# Patient Record
Sex: Female | Born: 1950 | Race: White | Hispanic: No | Marital: Married | Smoking: Never smoker
Health system: Southern US, Community
[De-identification: ages and names within clinical notes are randomized; demographics above are authoritative.]

## PROBLEM LIST (undated history)

## (undated) DIAGNOSIS — D649 Anemia, unspecified: Secondary | ICD-10-CM

## (undated) DIAGNOSIS — I10 Essential (primary) hypertension: Secondary | ICD-10-CM

## (undated) DIAGNOSIS — M1990 Unspecified osteoarthritis, unspecified site: Secondary | ICD-10-CM

## (undated) DIAGNOSIS — E0590 Thyrotoxicosis, unspecified without thyrotoxic crisis or storm: Secondary | ICD-10-CM

## (undated) DIAGNOSIS — Z5189 Encounter for other specified aftercare: Secondary | ICD-10-CM

## (undated) DIAGNOSIS — K219 Gastro-esophageal reflux disease without esophagitis: Secondary | ICD-10-CM

## (undated) DIAGNOSIS — Z91018 Allergy to other foods: Secondary | ICD-10-CM

## (undated) DIAGNOSIS — L900 Lichen sclerosus et atrophicus: Secondary | ICD-10-CM

## (undated) DIAGNOSIS — H269 Unspecified cataract: Secondary | ICD-10-CM

## (undated) DIAGNOSIS — K5792 Diverticulitis of intestine, part unspecified, without perforation or abscess without bleeding: Secondary | ICD-10-CM

## (undated) HISTORY — PX: MENISCUS REPAIR: SHX5179

## (undated) HISTORY — DX: Encounter for other specified aftercare: Z51.89

## (undated) HISTORY — DX: Unspecified cataract: H26.9

## (undated) HISTORY — DX: Allergy to other foods: Z91.018

## (undated) HISTORY — PX: ABDOMINAL HYSTERECTOMY: SHX81

## (undated) HISTORY — DX: Thyrotoxicosis, unspecified without thyrotoxic crisis or storm: E05.90

## (undated) HISTORY — DX: Gastro-esophageal reflux disease without esophagitis: K21.9

## (undated) HISTORY — DX: Diverticulitis of intestine, part unspecified, without perforation or abscess without bleeding: K57.92

## (undated) HISTORY — DX: Lichen sclerosus et atrophicus: L90.0

## (undated) HISTORY — DX: Essential (primary) hypertension: I10

## (undated) HISTORY — DX: Unspecified osteoarthritis, unspecified site: M19.90

## (undated) HISTORY — DX: Anemia, unspecified: D64.9

## (undated) HISTORY — PX: BREAST EXCISIONAL BIOPSY: SUR124

## (undated) HISTORY — PX: BREAST LUMPECTOMY: SHX2

---

## 2000-05-25 ENCOUNTER — Encounter: Admission: RE | Admit: 2000-05-25 | Discharge: 2000-05-25 | Payer: Self-pay | Admitting: Family Medicine

## 2000-05-25 ENCOUNTER — Encounter: Payer: Self-pay | Admitting: Family Medicine

## 2004-05-03 ENCOUNTER — Ambulatory Visit: Payer: Self-pay | Admitting: Family Medicine

## 2004-12-31 ENCOUNTER — Ambulatory Visit: Payer: Self-pay | Admitting: Family Medicine

## 2005-01-10 ENCOUNTER — Ambulatory Visit: Payer: Self-pay | Admitting: Family Medicine

## 2005-02-16 ENCOUNTER — Ambulatory Visit: Payer: Self-pay | Admitting: Family Medicine

## 2006-01-03 ENCOUNTER — Ambulatory Visit: Payer: Self-pay | Admitting: Internal Medicine

## 2006-02-21 ENCOUNTER — Ambulatory Visit: Payer: Self-pay | Admitting: Family Medicine

## 2006-05-25 ENCOUNTER — Ambulatory Visit: Payer: Self-pay | Admitting: Family Medicine

## 2006-06-15 ENCOUNTER — Encounter: Payer: Self-pay | Admitting: Internal Medicine

## 2006-06-15 DIAGNOSIS — E0590 Thyrotoxicosis, unspecified without thyrotoxic crisis or storm: Secondary | ICD-10-CM | POA: Insufficient documentation

## 2006-10-30 ENCOUNTER — Encounter (INDEPENDENT_AMBULATORY_CARE_PROVIDER_SITE_OTHER): Payer: Self-pay | Admitting: Internal Medicine

## 2006-10-30 ENCOUNTER — Ambulatory Visit: Payer: Self-pay | Admitting: Family Medicine

## 2006-10-30 DIAGNOSIS — L03019 Cellulitis of unspecified finger: Secondary | ICD-10-CM

## 2006-10-30 DIAGNOSIS — M79609 Pain in unspecified limb: Secondary | ICD-10-CM

## 2006-11-16 ENCOUNTER — Ambulatory Visit: Payer: Self-pay | Admitting: Family Medicine

## 2006-11-16 DIAGNOSIS — L259 Unspecified contact dermatitis, unspecified cause: Secondary | ICD-10-CM

## 2006-11-23 ENCOUNTER — Ambulatory Visit: Payer: Self-pay | Admitting: Family Medicine

## 2006-12-25 ENCOUNTER — Ambulatory Visit: Payer: Self-pay | Admitting: Internal Medicine

## 2006-12-26 ENCOUNTER — Encounter (INDEPENDENT_AMBULATORY_CARE_PROVIDER_SITE_OTHER): Payer: Self-pay | Admitting: Internal Medicine

## 2006-12-27 ENCOUNTER — Telehealth (INDEPENDENT_AMBULATORY_CARE_PROVIDER_SITE_OTHER): Payer: Self-pay | Admitting: Internal Medicine

## 2006-12-28 ENCOUNTER — Telehealth (INDEPENDENT_AMBULATORY_CARE_PROVIDER_SITE_OTHER): Payer: Self-pay | Admitting: Internal Medicine

## 2006-12-29 ENCOUNTER — Telehealth (INDEPENDENT_AMBULATORY_CARE_PROVIDER_SITE_OTHER): Payer: Self-pay | Admitting: Internal Medicine

## 2007-01-02 ENCOUNTER — Ambulatory Visit: Payer: Self-pay | Admitting: Family Medicine

## 2007-01-05 ENCOUNTER — Telehealth (INDEPENDENT_AMBULATORY_CARE_PROVIDER_SITE_OTHER): Payer: Self-pay | Admitting: Internal Medicine

## 2007-01-09 ENCOUNTER — Telehealth (INDEPENDENT_AMBULATORY_CARE_PROVIDER_SITE_OTHER): Payer: Self-pay | Admitting: Internal Medicine

## 2007-01-26 ENCOUNTER — Telehealth (INDEPENDENT_AMBULATORY_CARE_PROVIDER_SITE_OTHER): Payer: Self-pay | Admitting: Internal Medicine

## 2007-05-25 ENCOUNTER — Ambulatory Visit: Payer: Self-pay | Admitting: Family Medicine

## 2007-10-19 ENCOUNTER — Ambulatory Visit: Payer: Self-pay | Admitting: Family Medicine

## 2008-01-28 ENCOUNTER — Ambulatory Visit: Payer: Self-pay | Admitting: Family Medicine

## 2008-01-31 ENCOUNTER — Ambulatory Visit: Payer: Self-pay | Admitting: Family Medicine

## 2008-10-21 ENCOUNTER — Ambulatory Visit: Payer: Self-pay | Admitting: Family Medicine

## 2008-10-21 DIAGNOSIS — R21 Rash and other nonspecific skin eruption: Secondary | ICD-10-CM

## 2008-10-21 DIAGNOSIS — I10 Essential (primary) hypertension: Secondary | ICD-10-CM

## 2008-10-21 HISTORY — DX: Essential (primary) hypertension: I10

## 2009-03-03 ENCOUNTER — Telehealth (INDEPENDENT_AMBULATORY_CARE_PROVIDER_SITE_OTHER): Payer: Self-pay | Admitting: Internal Medicine

## 2009-03-06 ENCOUNTER — Encounter: Admission: RE | Admit: 2009-03-06 | Discharge: 2009-03-06 | Payer: Self-pay | Admitting: Family Medicine

## 2009-03-11 ENCOUNTER — Encounter: Payer: Self-pay | Admitting: Family Medicine

## 2009-10-23 ENCOUNTER — Ambulatory Visit: Payer: Self-pay | Admitting: Family Medicine

## 2010-04-27 NOTE — Assessment & Plan Note (Signed)
Summary: 30 MIN ?THUMBNAIL INFECTION/CLE   Vital Signs:  Patient profile:   60 year old female Height:      65 inches Weight:      180.25 pounds BMI:     30.10 Temp:     98.5 degrees F oral Pulse rate:   80 / minute BP sitting:   110 / 70  (left arm) Cuff size:   regular  Vitals Entered By: Linde Gillis CMA Duncan Dull) (October 23, 2009 1:52 PM) CC: 30 minute exam, ? thumb infection   History of Present Illness:  Ihe denied overt little lady presents with right-sided thumb pain, she is having pain on the lateral aspect of her phone, and she does do a great deal scanning and working in the garden. She also cleans houses some for living.  His now very painful, and as low but is of some redness and some degree of redness it is fair, and also little bit of some blood has been expressed. There hasn't been any gross pus.  REVIEW OF SYSTEMS GEN: No acute illnesses, no fever, chills, sweats. CV: No chest pain or SOB GI: No noted N or V Otherwise, pertinent positives and negatives are noted in the HPI.   GEN: Well-developed,well-nourished,in no acute distress; alert,appropriate and cooperative throughout examination HEENT: Normocephalic and atraumatic without obvious abnormalities. No apparent alopecia or balding. Ears, externally no deformities PULM: Breathing comfortably in no respiratory distress EXT: No clubbing, cyanosis, or edema PSYCH: Normally interactive. Cooperative during the interview. Pleasant. Friendly and conversant. Not anxious or depressed appearing. Normal, full affect.   Full range of motion in all hands, patient has had diffuse Heberden's and Bouchard's nodes in evidence of osteoarthritic changes throughout. On the medial aspect of the right side fingernail on the first digit, there is some redness, and some induration. There is no gross pus available.  Allergies: 1)  ! Demerol 2)  ! Compazine 3)  ! Benadryl  Past History:  Past Medical History: HYPERTHYROIDISM  (ICD-242.90)     Impression & Recommendations:  Problem # 1:  PARONYCHIA, FINGER (ICD-681.02) Assessment New finger prepped with Betadine x2 and alcohol x2, and the edge of the nail was touched with a #11 scalpel. No pus was expressed.  , and I placed the patient on antibiotics, have her do warm soaks many times during a day, about 10, and if she is not better, recommend she follow, and she's been seen my partner Dr. Para March for a toenail removal or rather a fingernail removal on Monday or Tuesday. 30 minute slot.  Her updated medication list for this problem includes:    Cephalexin 500 Mg Tabs (Cephalexin) .Marland Kitchen... Take one by mouth 4 times daily  Complete Medication List: 1)  Synthroid 137 Mcg Tabs (Levothyroxine sodium) .... Take 1 tablet by mouth once a day 2)  Aspirin 325 Mg Tabs (Aspirin) .... Takes one daily as needed 3)  Cephalexin 500 Mg Tabs (Cephalexin) .... Take one by mouth 4 times daily Prescriptions: CEPHALEXIN 500 MG  TABS (CEPHALEXIN) take one by mouth 4 times daily  #40 x 0   Entered and Authorized by:   Hannah Beat MD   Signed by:   Hannah Beat MD on 10/23/2009   Method used:   Electronically to        [facility] (retail)       [address]       [place], [place]  [address]       Ph: [phone]  or [phone]       Fax: [phone]   RxID:   [id number]   Current Allergies (reviewed today): ! DEMEROL ! COMPAZINE ! BENADRYL

## 2010-10-08 ENCOUNTER — Ambulatory Visit (HOSPITAL_BASED_OUTPATIENT_CLINIC_OR_DEPARTMENT_OTHER)
Admission: RE | Admit: 2010-10-08 | Discharge: 2010-10-08 | Disposition: A | Discharge status: Home / Self Care | Payer: 59 | Source: Ambulatory Visit | Attending: Specialist | Admitting: Specialist

## 2010-10-08 DIAGNOSIS — IMO0002 Reserved for concepts with insufficient information to code with codable children: Secondary | ICD-10-CM | POA: Insufficient documentation

## 2010-10-08 DIAGNOSIS — Z01812 Encounter for preprocedural laboratory examination: Secondary | ICD-10-CM | POA: Insufficient documentation

## 2010-10-08 DIAGNOSIS — M2240 Chondromalacia patellae, unspecified knee: Secondary | ICD-10-CM | POA: Insufficient documentation

## 2010-10-08 DIAGNOSIS — Z01810 Encounter for preprocedural cardiovascular examination: Secondary | ICD-10-CM | POA: Insufficient documentation

## 2010-10-08 DIAGNOSIS — X58XXXA Exposure to other specified factors, initial encounter: Secondary | ICD-10-CM | POA: Insufficient documentation

## 2010-10-08 LAB — POCT HEMOGLOBIN-HEMACUE: Hemoglobin: 13.8 g/dL (ref 12.0–15.0)

## 2010-10-15 NOTE — Op Note (Signed)
  Melissa Cox, Melissa Cox              ACCOUNT NO.:  [id number]  MEDICAL RECORD NO.:  [id number]  LOCATION:                                 FACILITY:  PHYSICIAN:  Jene Every, M.D.    DATE OF BIRTH:  07/17/1950  DATE OF PROCEDURE:  10/08/2010 DATE OF DISCHARGE:                              OPERATIVE REPORT   PREOPERATIVE DIAGNOSIS:  Medial meniscus tear, left knee.  POSTOPERATIVE DIAGNOSES: 1. Medial meniscus tear, left knee. 2. 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 3. Grade III chondromalacia of the patella. 4. 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  PROCEDURES PERFORMED: 1. Left knee arthroscopy. 2. Partial medial meniscectomy. 3. Chondroplasties of the femoral condyle, tibial plateau and patella.  HISTORY:  A 60 year old with locking, popping, giving way.  MRI indicating posterior horn medial meniscus tear, chondromalacias, is indicated for arthroscopic debridement and partial meniscectomy.  Risks and benefits were discussed including bleeding, infection, no change in symptoms, worsening symptoms, need for repeat debridement, DVT, PE, anesthetic complications, etc.  TECHNIQUE:  Patient in supine position.  After adequate general anesthesia and 1 g Kefzol, left lower extremity was prepped and draped in the usual sterile fashion.  A lateral parapatellar portal and superomedial parapatellar portal was fashioned with a #11 blade. Ingress cannula atraumatically placed.  Irrigant was utilized to insufflate the joint.  Under direct visualization, medial parapatellar portal was fashioned with a #11 blade after localization with an 18 gauge needle sparing the medial meniscus noted.  There was mild grade III chondromalacia of the femoral condyle with posterior root meniscus tear with hemorrhagic zone.  Introduced the shaver and debrided it to a stable base, very small tear was noted.  ACL was unremarkable.  Lateral  compartment revealed extensive grade III changes of tibial plateau and chondral flap tear with cartilaginous debris.  Light chondroplasty was performed here.  Meniscus was stable to probe palpation without evidence of tearing.  Suprapatellar pouch revealed minor grade III changes of the patella, normal patellofemoral tracking.  Gutters unremarkable.  Light chondroplasty was performed here.  Copiously lavaged the knee.  All compartments were reexamined.  No further pathology amenable arthroscopic intervention, therefore, removed all instrumentation and portals were closed with 4-0 nylon simple sutures, 0.25% Marcaine with epinephrine was infiltrated in the joint and wound was dressed sterilely. Awoken without difficulty and transported to the recovery room in satisfactory condition.  The patient tolerated the procedure.  There were no complications.  ASSISTANT:  None.     Jene Every, M.D.     Cordelia Pen  D:  10/08/2010  T:  10/08/2010  Job:  [id number]  Electronically Signed by Jene Every M.D. on 10/15/2010 03:14:20 PM

## 2011-08-24 ENCOUNTER — Ambulatory Visit (INDEPENDENT_AMBULATORY_CARE_PROVIDER_SITE_OTHER): Payer: 59 | Admitting: Family Medicine

## 2011-08-24 ENCOUNTER — Encounter: Payer: Self-pay | Admitting: Family Medicine

## 2011-08-24 VITALS — BP 160/82 | HR 84 | Temp 98.7°F | Wt 182.0 lb

## 2011-08-24 DIAGNOSIS — L03114 Cellulitis of left upper limb: Secondary | ICD-10-CM

## 2011-08-24 DIAGNOSIS — I10 Essential (primary) hypertension: Secondary | ICD-10-CM

## 2011-08-24 DIAGNOSIS — IMO0002 Reserved for concepts with insufficient information to code with codable children: Secondary | ICD-10-CM

## 2011-08-24 MED ORDER — AMOXICILLIN-POT CLAVULANATE 875-125 MG PO TABS: 1.0000 | ORAL_TABLET | Freq: Two times a day (BID) | ORAL | Status: AC

## 2011-08-24 MED ORDER — HYDROCHLOROTHIAZIDE 12.5 MG PO TABS: 12.5000 mg | ORAL_TABLET | Freq: Every day | ORAL | Status: DC

## 2011-08-24 NOTE — Patient Instructions (Signed)
F/u tomorrow afternoon to recheck with Dr. Patsy Lager

## 2011-08-24 NOTE — Progress Notes (Signed)
  Patient Name: Melissa Cox Date of Birth: Oct 29, 1950 Age: 61 y.o. Medical Record Number: [id number] Gender: female Date of Encounter: 08/24/2011  History of Present Illness:  [name] is a 61 y.o. very pleasant female patient who presents with the following:  Last PM: rooster puncture  The patient was attack yesterday afternoon by a large blister. The spur punctured the volar aspect of her forearm. Today it has become red and warm. There is a puncture mark. She has not been able to soak it much. There is no fluctuance or pus. She is currently afebrile.  Hypertension: Blood pressure elevated today, was also recently elevated in another physician's office. She has had some elevated blood pressures in the past.  Past Medical History, Surgical History, Social History, Family History, Problem List, Medications, and Allergies have been reviewed and updated if relevant.  Review of Systems: ROS: GEN: Acute illness details above GI: Tolerating PO intake GU: maintaining adequate hydration and urination Pulm: No SOB Interactive and getting along well at home.  Otherwise, ROS is as per the HPI.   Physical Examination: Filed Vitals:   08/24/11 1514  BP: 160/82  Pulse: 84  Temp: 98.7 F (37.1 C)  TempSrc: Oral  Weight: 182 lb (82.555 kg)    There is no height on file to calculate BMI.   GEN: WDWN, NAD, Non-toxic, A & O x 3 HEENT: Atraumatic, Normocephalic. Neck supple. No masses, No LAD. Ears and Nose: No external deformity. CV: RRR, No M/G/R. No JVD. No thrill. No extra heart sounds. PULM: CTA B, no wheezes, crackles, rhonchi. No retractions. No resp. distress. No accessory muscle use. EXTR: No c/c/e NEURO Normal gait.  PSYCH: Normally interactive. Conversant. Not depressed or anxious appearing.  Calm demeanor.   SKIN: Flat red well demarcated redness with some mild warmth. Small area of puncture wound centrally. No fluctuance.  Assessment and Plan:  1. Cellulitis  of arm, left  amoxicillin-clavulanate (AUGMENTIN) 875-125 MG per tablet  2. Unspecified essential hypertension  hydrochlorothiazide (HYDRODIURIL) 12.5 MG tablet   Borders marked with pen. Recheck tomorrow.  Orders Today: No orders of the defined types were placed in this encounter.    Medications Today: Meds ordered this encounter  Medications  . levothyroxine (SYNTHROID, LEVOTHROID) 125 MCG tablet    Sig: Take 125 mcg by mouth daily.  Marland Kitchen amoxicillin-clavulanate (AUGMENTIN) 875-125 MG per tablet    Sig: Take 1 tablet by mouth 2 (two) times daily.    Dispense:  20 tablet    Refill:  0  . hydrochlorothiazide (HYDRODIURIL) 12.5 MG tablet    Sig: Take 1 tablet (12.5 mg total) by mouth daily.    Dispense:  30 tablet    Refill:  3

## 2011-08-25 ENCOUNTER — Encounter: Payer: Self-pay | Admitting: Family Medicine

## 2011-08-25 ENCOUNTER — Ambulatory Visit (INDEPENDENT_AMBULATORY_CARE_PROVIDER_SITE_OTHER): Payer: 59 | Admitting: Family Medicine

## 2011-08-25 VITALS — BP 130/82 | HR 87 | Temp 98.7°F | Wt 182.8 lb

## 2011-08-25 DIAGNOSIS — IMO0002 Reserved for concepts with insufficient information to code with codable children: Secondary | ICD-10-CM

## 2011-08-25 DIAGNOSIS — L03119 Cellulitis of unspecified part of limb: Secondary | ICD-10-CM

## 2011-08-25 DIAGNOSIS — I10 Essential (primary) hypertension: Secondary | ICD-10-CM

## 2011-08-25 MED ORDER — DOXYCYCLINE HYCLATE 100 MG PO TABS: 100.0000 mg | ORAL_TABLET | Freq: Two times a day (BID) | ORAL | Status: DC

## 2011-08-25 MED ORDER — CEFTRIAXONE SODIUM 1 G IJ SOLR
1.0000 g | Freq: Once | INTRAMUSCULAR | Status: AC
  Administered 2011-08-24: 1 g via INTRAMUSCULAR

## 2011-08-25 NOTE — Progress Notes (Signed)
Addended by: Eliezer Bottom on: 08/25/2011 04:49 PM   Modules accepted: Orders

## 2011-08-25 NOTE — Patient Instructions (Signed)
F/u tomorrow morning with Dr. Sharen Hones

## 2011-08-26 ENCOUNTER — Ambulatory Visit (INDEPENDENT_AMBULATORY_CARE_PROVIDER_SITE_OTHER): Payer: 59 | Admitting: Family Medicine

## 2011-08-26 ENCOUNTER — Encounter: Payer: Self-pay | Admitting: Family Medicine

## 2011-08-26 VITALS — BP 130/80 | HR 88 | Temp 98.3°F | Wt 182.0 lb

## 2011-08-26 DIAGNOSIS — L03113 Cellulitis of right upper limb: Secondary | ICD-10-CM | POA: Insufficient documentation

## 2011-08-26 DIAGNOSIS — IMO0002 Reserved for concepts with insufficient information to code with codable children: Secondary | ICD-10-CM

## 2011-08-26 NOTE — Assessment & Plan Note (Signed)
Overall improved compared to yesterday. Only took 1 pill of doxycycline so far (last night). I doubt doxy is contributing to improvement but rather the fact that she rested arm overnight and possibly augmentin effect setting in. Given diarrhea, concern for abx associated effects to GI tract, recommended she hold doxycycline for now, and monitor over weekend, if any worsening to restart doxy. rec take yogurt with probiotic. F/u with PCP on Monday for hopefully final recheck.

## 2011-08-26 NOTE — Progress Notes (Signed)
  Subjective:    Patient ID: Melissa Cox, female    DOB: 1950-10-06, 61 y.o.   MRN: [id number]  HPI CC: recheck cellulitis  See prior notes for details.  I did see pt's cellulitis yesterday and she is here today to recheck this.  DOI: 5/28 pm Briefly, rooster spur stuck skin right dorsal forearm.  Seen here next day with spreading erythema and pain, treated with 1gm IM Rocephin as well as started on augmentin bid.  Next day erythema had spread even further so added on doxycycline.    Today redness is much better.  Has been keeping arm elevated at night.   No fevers/chills.   + diarrhea started today.  Also mild nausea with this.  Stomach upset 30 min after taking doxy (even though taken with meal)  Review of Systems Per HPI    Objective:   Physical Exam NAD, WDWN CF Right forearm with puncture mark with overlying small scab.  surrounding erythema and mild induration however erythema seems to be fading overall, has receded compared to yesterday's marks.  Delineated erythema border again today.  No streaking redness, no fluctuance.    Assessment & Plan:

## 2011-08-26 NOTE — Patient Instructions (Signed)
Cellulitis is looking much better! Stop doxycycline for now.  If worsening over weekend, you may restart this.  Finish 10 day course of augmentin. Take yogurt. Return on Monday for recheck with Dr. Patsy Lager

## 2011-08-26 NOTE — Progress Notes (Signed)
[facility] at [facility]  Patient Name: Melissa Cox Date of Birth: June 03, 1950 Medical Record Number: [id number] Gender: female Date of Encounter: 08/25/2011  History of Present Illness:  Melissa Cox is a 61 y.o. very pleasant female patient who presents with the following:  2 days prior the patient had a rooster spur that struck her forearm. Subsequently, she was evaluated the next day and had some redness pain and swelling around this area. She was given 1 g of Rocephin in the office and started on Augmentin 875. Today she is here today and her pain is diminished. There is some increased and spreading of the redness. She did work yesterday and used her hands and arms a great deal. She is currently afebrile. She is tolerating p.o. Intake. There is not been any pus or drainage.  Patient Active Problem List  Diagnoses  . HYPERTHYROIDISM  . Hypertension   Past Medical History  Diagnosis Date  . Unspecified essential hypertension 10/21/2008    Qualifier: Diagnosis of  By: Jillyn Hidden FNP, Mcarthur Rossetti   . 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 Abdominal hysterectomy 4098,1191    partial, fibroids  . Breast lumpectomy     right,benign   History  Substance Use Topics  . Smoking status: Never Smoker   . Smokeless tobacco: Not on file  . Alcohol Use: Not on file   No family history on file. Allergies  Allergen Reactions  . Diphenhydramine Hcl     REACTION: abd cramps and diarrhea  . Meperidine Hcl     REACTION: rash  . Prochlorperazine Edisylate     REACTION: increased heart rate    Medication list has been reviewed and updated.  Prior to Admission medications   Medication Sig Start Date End Date Taking? Authorizing Provider  amoxicillin-clavulanate (AUGMENTIN) 875-125 MG per tablet Take 1 tablet by mouth 2 (two) times daily. 08/24/11 09/03/11 Yes  Braedin Millhouse, MD  hydrochlorothiazide (HYDRODIURIL) 12.5 MG tablet Take 1 tablet (12.5 mg total) by mouth daily. 08/24/11 08/23/12 Yes Claris Guymon, MD  levothyroxine (SYNTHROID, LEVOTHROID) 125 MCG tablet Take 125 mcg by mouth daily.   Yes Historical Provider, MD  doxycycline (VIBRA-TABS) 100 MG tablet Take 1 tablet (100 mg total) by mouth 2 (two) times daily. 08/25/11 09/04/11  Hannah Beat, MD    Review of Systems:  ROS: GEN: Acute illness details above GI: Tolerating PO intake GU: maintaining adequate hydration and urination Pulm: No SOB Interactive and getting along well at home.  Otherwise, ROS is as per the HPI.   Physical Examination: Filed Vitals:   08/25/11 1544  BP: 130/82  Pulse: 87  Temp: 98.7 F (37.1 C)   Filed Vitals:   08/25/11 1544  Weight: 182 lb 12 oz (82.895 kg)   There is no height on file to calculate BMI.   GEN: WDWN, NAD, Non-toxic, Alert & Oriented x 3 HEENT: Atraumatic, Normocephalic.  Ears and Nose: No external deformity. EXTR: No clubbing/cyanosis/edema NEURO: Normal gait.  PSYCH: Normally interactive. Conversant. Not depressed or anxious appearing.  Calm demeanor.  SKIN: area of redness noted with a centralized puncture wound that has been closed. Area of redness is marked with a marker. This does appear to be slightly less red compared to the prior day.  Assessment and Plan:  1. Cellulitis of arm   2. Hypertension    Broaden the patient's coverage  with adding doxycycline. Area marked with magic marker again. I did have her follow up again tomorrow for a recheck with my partner Dr. Reece Agar - who I also had examine the area again today.  If she worsens overnight, then at that point she may ultimately need IV ABX or potentially a hand surgical consultation.  Orders Today: Orders Placed This Encounter  Procedures  . HM MAMMOGRAPHY    This external order was created through the Results Console.    Medications Today: Meds ordered this  encounter  Medications  . doxycycline (VIBRA-TABS) 100 MG tablet    Sig: Take 1 tablet (100 mg total) by mouth 2 (two) times daily.    Dispense:  20 tablet    Refill:  0     Hannah Beat, MD 08/26/2011 9:08 AM

## 2011-08-29 ENCOUNTER — Encounter: Payer: Self-pay | Admitting: Family Medicine

## 2011-08-29 ENCOUNTER — Ambulatory Visit (INDEPENDENT_AMBULATORY_CARE_PROVIDER_SITE_OTHER): Payer: 59 | Admitting: Family Medicine

## 2011-08-29 VITALS — BP 130/82 | HR 75 | Temp 98.4°F | Ht 65.0 in | Wt 182.5 lb

## 2011-08-29 DIAGNOSIS — L03113 Cellulitis of right upper limb: Secondary | ICD-10-CM

## 2011-08-29 DIAGNOSIS — IMO0002 Reserved for concepts with insufficient information to code with codable children: Secondary | ICD-10-CM

## 2011-08-29 NOTE — Progress Notes (Signed)
[facility] at [facility]  Patient Name: Melissa Cox Date of Birth: [birth year] Medical Record Number: [id number] Gender: female Date of Encounter: 08/29/2011  History of Present Illness:  Melissa Cox is a 61 y.o. very pleasant female patient who presents with the following:  DOI 08/23/2011  Here for recheck of cellulitis, s/p rooster spur wound. S/p Rocephin 1 g, Augmentin 875 mg bid. Had a few doses of doxy, stopped due to nausea  Prior OV 2 days prior the patient had a rooster spur that struck her forearm. Subsequently, she was evaluated the next day and had some redness pain and swelling around this area. She was given 1 g of Rocephin in the office and started on Augmentin 875. Today she is here today and her pain is diminished. There is some increased and spreading of the redness. She did work yesterday and used her hands and arms a great deal. She is currently afebrile. She is tolerating p.o. Intake. There is not been any pus or drainage.   Patient Active Problem List  Diagnoses  . HYPERTHYROIDISM  . Hypertension  . Right arm cellulitis   Past Medical History  Diagnosis Date  . Unspecified essential hypertension 10/21/2008  . 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 Abdominal hysterectomy 4098,1191    partial, fibroids  . Breast lumpectomy     right,benign   History  Substance Use Topics  . Smoking status: Never Smoker   . Smokeless tobacco: Not on file  . Alcohol Use: Not on file   No family history on file. Allergies  Allergen Reactions  . Diphenhydramine Hcl     REACTION: abd cramps and diarrhea  . Meperidine Hcl     REACTION: rash  . Prochlorperazine Edisylate     REACTION: increased heart rate    Medication list has been reviewed and updated.  Prior to Admission medications   Medication Sig Start Date End Date Taking? Authorizing  Provider  amoxicillin-clavulanate (AUGMENTIN) 875-125 MG per tablet Take 1 tablet by mouth 2 (two) times daily. 08/24/11 09/03/11 Yes Shankar Silber, MD  hydrochlorothiazide (HYDRODIURIL) 12.5 MG tablet Take 1 tablet (12.5 mg total) by mouth daily. 08/24/11 08/23/12 Yes Tyrell Brereton, MD  levothyroxine (SYNTHROID, LEVOTHROID) 125 MCG tablet Take 125 mcg by mouth daily.   Yes Historical Provider, MD    Review of Systems:  ROS: GEN: Acute illness details above GI: Tolerating PO intake GU: maintaining adequate hydration and urination Pulm: No SOB Interactive and getting along well at home.  Otherwise, ROS is as per the HPI.   Physical Examination: Filed Vitals:   08/29/11 0940  BP: 130/82  Pulse: 75  Temp: 98.4 F (36.9 C)   Filed Vitals:   08/29/11 0940  Height: 5\' 5"  (1.651 m)  Weight: 182 lb 8 oz (82.781 kg)   Body mass index is 30.37 kg/(m^2).   GEN: WDWN, NAD, Non-toxic, Alert & Oriented x 3 HEENT: Atraumatic, Normocephalic.  Ears and Nose: No external deformity. EXTR: No clubbing/cyanosis/edema NEURO: Normal gait.  PSYCH: Normally interactive. Conversant. Not depressed or anxious appearing.  Calm demeanor.  SKIN: dramatically improved  Assessment and Plan: F/u cellulitis, R forearm, much improved. Finish abx  Hannah Beat, MD 08/29/2011 9:54 AM

## 2012-11-07 ENCOUNTER — Ambulatory Visit (INDEPENDENT_AMBULATORY_CARE_PROVIDER_SITE_OTHER): Payer: 59 | Admitting: Family Medicine

## 2012-11-07 ENCOUNTER — Encounter: Payer: Self-pay | Admitting: Family Medicine

## 2012-11-07 VITALS — BP 150/88 | HR 68 | Temp 98.1°F | Wt 182.0 lb

## 2012-11-07 DIAGNOSIS — L03019 Cellulitis of unspecified finger: Secondary | ICD-10-CM | POA: Insufficient documentation

## 2012-11-07 DIAGNOSIS — L03012 Cellulitis of left finger: Secondary | ICD-10-CM

## 2012-11-07 MED ORDER — DOXYCYCLINE HYCLATE 100 MG PO TABS: 100.0000 mg | ORAL_TABLET | Freq: Two times a day (BID) | ORAL | Status: DC

## 2012-11-07 NOTE — Patient Instructions (Signed)
Complete antibiotics. Soak in warm water 3-4 times a day. Keep clean and covered, apply antibiotic ointment. Make appt to treat if redness spreading, increasing pain or swelling.

## 2012-11-07 NOTE — Progress Notes (Signed)
  Subjective:    Patient ID: Melissa Cox, female    DOB: 06/22/1950, 62 y.o.   MRN: [id number]  HPI  62 year old female presents with  right redness, pain, pressure and swelling in left thumb nail medially.  She does a lot of gardening, she washes and a lot but does not wear gloves a lot. No discharge. Has soaking it in epsom salt water and al=pplying neosporin.  No fever.     Review of Systems  Constitutional: Negative for fever and fatigue.  HENT: Negative for ear pain.   Eyes: Negative for pain.  Respiratory: Negative for chest tightness and shortness of breath.   Cardiovascular: Negative for chest pain, palpitations and leg swelling.  Gastrointestinal: Negative for abdominal pain.  Genitourinary: Negative for dysuria.       Objective:   Physical Exam  Constitutional: She appears well-developed and well-nourished.  Cardiovascular: Normal rate, regular rhythm and normal heart sounds.  Exam reveals no friction rub.   No murmur heard. Pulmonary/Chest: Effort normal and breath sounds normal. She has no wheezes. She has no rales.  Abdominal: Soft. Bowel sounds are normal.  Skin:   Red ness and swelling at medial left humb nail edge.          Assessment & Plan:

## 2012-11-07 NOTE — Assessment & Plan Note (Signed)
Will treat with warm soaks, antibiotics... Pt wants to wait on I and D if able. She is to call if not resolving towards end of antibotics or sooner if worsening.

## 2013-02-01 ENCOUNTER — Other Ambulatory Visit: Payer: Self-pay | Admitting: *Deleted

## 2013-02-01 MED ORDER — LEVOTHYROXINE SODIUM 112 MCG PO TABS: ORAL_TABLET | ORAL | Status: DC

## 2013-03-25 ENCOUNTER — Ambulatory Visit (INDEPENDENT_AMBULATORY_CARE_PROVIDER_SITE_OTHER): Payer: 59 | Admitting: Family Medicine

## 2013-03-25 ENCOUNTER — Encounter: Payer: Self-pay | Admitting: Family Medicine

## 2013-03-25 VITALS — BP 132/80 | HR 82 | Temp 98.7°F | Ht 65.0 in | Wt 186.2 lb

## 2013-03-25 DIAGNOSIS — Z13220 Encounter for screening for lipoid disorders: Secondary | ICD-10-CM

## 2013-03-25 DIAGNOSIS — H6980 Other specified disorders of Eustachian tube, unspecified ear: Secondary | ICD-10-CM

## 2013-03-25 DIAGNOSIS — H6981 Other specified disorders of Eustachian tube, right ear: Secondary | ICD-10-CM

## 2013-03-25 DIAGNOSIS — L299 Pruritus, unspecified: Secondary | ICD-10-CM | POA: Insufficient documentation

## 2013-03-25 DIAGNOSIS — J069 Acute upper respiratory infection, unspecified: Secondary | ICD-10-CM

## 2013-03-25 MED ORDER — FLUTICASONE PROPIONATE 50 MCG/ACT NA SUSP: 2.0000 | Freq: Every day | NASAL | Status: DC

## 2013-03-25 NOTE — Assessment & Plan Note (Signed)
symptom treatment.

## 2013-03-25 NOTE — Progress Notes (Signed)
Pre-visit discussion using our clinic review tool. No additional management support is needed unless otherwise documented below in the visit note.  

## 2013-03-25 NOTE — Assessment & Plan Note (Signed)
No clear sing of autoimmune disease.  More liekly contact derm/ allergic rxn.  Keep diary. Use antihistamin.  Will cahnk TSh and cbc... PCP can consider autoimmune disease testing if indicated.

## 2013-03-25 NOTE — Patient Instructions (Signed)
Return for CPX  With PCP with fasting labs prior. If hand itching.. Try zyrtec/claritin for antihistamine and keep diary of possible triggers. Start nasal saline spray 2-3 times daily. Start nasal steroid daily. Call if not improving as expected in next 7 days or call earlier if fever, or unilateral face pain.

## 2013-03-25 NOTE — Assessment & Plan Note (Signed)
Nasla saline, nasal steroid.

## 2013-03-25 NOTE — Progress Notes (Signed)
Subjective:    Patient ID: Melissa Cox, female    DOB: 1950-05-03, 62 y.o.   MRN: [id number]  Sore Throat  This is a new problem. The current episode started in the past 7 days (3 days). The problem has been gradually worsening. Neither side of throat is experiencing more pain than the other. There has been no fever. The pain is at a severity of 7/10. The pain is moderate. Associated symptoms include congestion, coughing, ear pain, a plugged ear sensation, swollen glands and trouble swallowing. Pertinent negatives include no drooling, ear discharge, headaches, shortness of breath or stridor. Associated symptoms comments: occ chills  mucus and cough in AMs, better later in the day  mild chest pain with breathing on right upper chest . She has had no exposure to strep or mono. Treatments tried: vit C, aspirin. The treatment provided mild relief.  Otalgia  There is pain in the right ear. This is a new problem. The current episode started today. The problem has been gradually worsening. Associated symptoms include coughing. Pertinent negatives include no ear discharge or headaches. There is no history of a chronic ear infection, hearing loss or a tympanostomy tube.   Nonsmoker.  Also 2 times in the last 3 weeks she has sudden onset of itching hands and feet. Hands appera red, no other rash. No tounge swelling, no lip swelling, no SOB. 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 No known exposure as the  Itching in hands is not related to using any cleaners. No known food changes. Anti itch cream.   She is concerned about auto immune disease. Requests testing.  No joint swelling or pain.  Some increase in fatigue. Neice with RA.   Review of Systems  HENT: Positive for congestion, ear pain and trouble swallowing. Negative for drooling and ear discharge.   Respiratory: Positive for cough. Negative for shortness of breath and stridor.     Neurological: Negative for headaches.       Objective:   Physical Exam  Constitutional: Vital signs are normal. She appears well-developed and well-nourished. She is cooperative.  Non-toxic appearance. She does not appear ill. No distress.  HENT:  Head: Normocephalic.  Right Ear: Hearing, tympanic membrane, external ear and ear canal normal. Tympanic membrane is not erythematous, not retracted and not bulging.  Left Ear: Hearing, tympanic membrane, external ear and ear canal normal. Tympanic membrane is not erythematous, not retracted and not bulging.  Nose: Mucosal edema and rhinorrhea present. Right sinus exhibits no maxillary sinus tenderness and no frontal sinus tenderness. Left sinus exhibits no maxillary sinus tenderness and no frontal sinus tenderness.  Mouth/Throat: Uvula is midline, oropharynx is clear and moist and mucous membranes are normal.  Eyes: Conjunctivae, EOM and lids are normal. Pupils are equal, round, and reactive to light. Lids are everted and swept, no foreign bodies found.  Neck: Trachea normal and normal range of motion. Neck supple. Carotid bruit is not present. No mass and no thyromegaly present.  Cardiovascular: Normal rate, regular rhythm, S1 normal, S2 normal, normal heart sounds, intact distal pulses and normal pulses.  Exam reveals no gallop and no friction rub.   No murmur heard. Pulmonary/Chest: Effort normal and breath sounds normal. Not tachypneic. No respiratory distress. She has no decreased breath sounds. She has no wheezes. She has no rhonchi. She has no rales.  Musculoskeletal:  No join swelling... 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  Neurological: She is alert.  Skin: Skin is warm, dry and intact. No rash noted.  Psychiatric: Her speech is normal and behavior is normal. Judgment normal. Her mood appears not anxious. Cognition and memory are normal. She does not exhibit a depressed mood.          Assessment & Plan:

## 2013-05-15 ENCOUNTER — Other Ambulatory Visit: Payer: Self-pay | Admitting: *Deleted

## 2013-05-15 MED ORDER — LEVOTHYROXINE SODIUM 112 MCG PO TABS: ORAL_TABLET | ORAL | Status: DC

## 2013-05-17 ENCOUNTER — Ambulatory Visit (INDEPENDENT_AMBULATORY_CARE_PROVIDER_SITE_OTHER): Payer: 59 | Admitting: Endocrinology

## 2013-05-17 ENCOUNTER — Encounter: Payer: Self-pay | Admitting: Endocrinology

## 2013-05-17 VITALS — BP 118/76 | HR 77 | Temp 98.3°F | Resp 14 | Ht 65.0 in | Wt 189.6 lb

## 2013-05-17 DIAGNOSIS — E890 Postprocedural hypothyroidism: Secondary | ICD-10-CM

## 2013-05-17 DIAGNOSIS — E559 Vitamin D deficiency, unspecified: Secondary | ICD-10-CM | POA: Insufficient documentation

## 2013-05-17 NOTE — Progress Notes (Signed)
Patient ID: Melissa Cox, female   DOB: [birth year], 63 y.o.   MRN: [id number]  Reason for Appointment:  Hypothyroidism, followup visit    History of Present Illness:   The hypothyroidism was first diagnosed in 1977 after treatment of her Graves' disease with I-131  The patient has been treated with levothyroxine in varying doses, more recently ranging from 112 up to137 mcg The last visit was probably in 12/2011   On the last visit the dose was continue it unchanged since her TSH level was normal at 1.3 She was instructed to come back in 6 months but did not do so  Patient has no complaints of unusual fatigue, cold sensitivity, dry skin, unusual weight gain or hair loss.            The patient is taking the brand-name thyroid supplement very regularly in the morning before breakfast.  Not taking any calcium or iron supplements with the thyroid supplement.     No visits with results within 1 Week(s) from this visit. Latest known visit with results is:  Office Visit on 08/25/2011  Component Date Value Ref Range Status  . HM Mammogram 03/11/2009 normal   Final      Medication List       This list is accurate as of: 05/17/13  1:43 PM.  Always use your most recent med list.               levothyroxine 112 MCG tablet  Commonly known as:  SYNTHROID, LEVOTHROID  Take one tablet by mouth daily before breakfast        Allergies:  Allergies  Allergen Reactions  . Diphenhydramine Hcl     REACTION: abd cramps and diarrhea  . Meperidine Hcl     REACTION: rash  . Prochlorperazine Edisylate     REACTION: increased heart rate    Past Medical History  Diagnosis Date  . Unspecified essential hypertension 10/21/2008  . Thyrotoxicosis without mention of goiter or other cause, without mention of thyrotoxic crisis or storm     Past Surgical History  Procedure Laterality Date  . Abdominal hysterectomy  9024,0973    partial, fibroids  . Breast lumpectomy      right,benign     No family history on file.  Social History:  reports that she has never smoked. She has never used smokeless tobacco. She reports that she drinks alcohol. She reports that she does not use illicit drugs.  REVIEW Of SYSTEMS:  Off Lotensin 1 year, previously was treated for hypertension. She thinks it was causing headaches  Her previous vitamin D level was 18 and she had been taking 5000 units supplement. Has not taken this for several months   Examination:   BP 118/76  Pulse 77  Temp(Src) 98.3 F (36.8 C)  Resp 14  Ht 5\' 5"  (1.651 m)  Wt 189 lb 9.6 oz (86.002 kg)  BMI 31.55 kg/m2  SpO2 96%  GENERAL APPEARANCE: Alert, no puffiness of the face or eyes  NECK: Thyroid is not palpable           NEUROLOGIC EXAM:  biceps reflexes show normal relaxation Skin: Not unusual dry    Assessment   Hypothyroidism, post ablative, long-standing. Although subjectively she is doing fairly well she has not taken her supplement for the last week or so and would be inaccurate to check her levels today  History of vitamin D deficiency, currently not taking any supplement   Treatment:  She is to  restart taking her vitamin D, 5000 units daily OTC   Continue same dosage before breakfast daily for now and have labs checked in 6 weeks to decide on dosage and appropriate followup.  Followup with PCP for preventative annual exams   [facility] 05/17/2013, 1:43 PM

## 2013-05-17 NOTE — Patient Instructions (Signed)
Start Vitamin D3, 5000 units daily  Synthroid 112ug daily

## 2013-05-30 ENCOUNTER — Ambulatory Visit (INDEPENDENT_AMBULATORY_CARE_PROVIDER_SITE_OTHER): Payer: 59 | Admitting: Podiatry

## 2013-05-30 ENCOUNTER — Encounter: Payer: Self-pay | Admitting: Podiatry

## 2013-05-30 VITALS — BP 118/76 | HR 78 | Resp 16

## 2013-05-30 DIAGNOSIS — Q828 Other specified congenital malformations of skin: Secondary | ICD-10-CM

## 2013-05-30 DIAGNOSIS — M779 Enthesopathy, unspecified: Secondary | ICD-10-CM

## 2013-05-30 MED ORDER — TRIAMCINOLONE ACETONIDE 10 MG/ML IJ SUSP
10.0000 mg | Freq: Once | INTRAMUSCULAR | Status: AC
  Administered 2013-05-30: 10 mg

## 2013-05-30 NOTE — Progress Notes (Signed)
Subjective:     Patient ID: Melissa Cox, female   DOB: 1950/05/01, 63 y.o.   MRN: [id number]  HPI patient presents stating the lesion on my left foot has come back and that joint feels like it's filled with fluid   Review of Systems     Objective:   Physical Exam Neurovascular status intact with inflammation and pain around the fifth metatarsal head left and keratotic lesion formation it's painful when pressed    Assessment:     Capsulitis with plantarflexed metatarsal fifth left with fluid buildup    Plan:     Capsular injection left fifth MPJ to milligrams Kenalog dexamethasone combination and debridement of lesions done today

## 2013-06-17 ENCOUNTER — Telehealth: Payer: Self-pay | Admitting: Endocrinology

## 2013-06-17 NOTE — Telephone Encounter (Signed)
Pt states she is currently out of town and out of her Thyroid medication  You may call it into [place] [id number] Pharm:[phone]  Thank You :)

## 2013-06-17 NOTE — Telephone Encounter (Signed)
rx called into to pharmacy listed below

## 2013-06-27 ENCOUNTER — Other Ambulatory Visit: Payer: 59

## 2013-06-28 ENCOUNTER — Other Ambulatory Visit: Payer: 59

## 2013-08-06 ENCOUNTER — Other Ambulatory Visit (INDEPENDENT_AMBULATORY_CARE_PROVIDER_SITE_OTHER): Payer: 59

## 2013-08-06 DIAGNOSIS — E890 Postprocedural hypothyroidism: Secondary | ICD-10-CM

## 2013-08-06 LAB — TSH: TSH: 0.17 u[IU]/mL — ABNORMAL LOW (ref 0.35–4.50)

## 2013-08-06 LAB — T4, FREE: FREE T4: 1.09 ng/dL (ref 0.60–1.60)

## 2013-08-08 ENCOUNTER — Encounter: Payer: Self-pay | Admitting: Endocrinology

## 2013-08-08 ENCOUNTER — Ambulatory Visit (INDEPENDENT_AMBULATORY_CARE_PROVIDER_SITE_OTHER): Payer: 59 | Admitting: Endocrinology

## 2013-08-08 VITALS — BP 128/82 | HR 87 | Temp 98.3°F | Resp 14 | Ht 65.0 in | Wt 188.6 lb

## 2013-08-08 DIAGNOSIS — E559 Vitamin D deficiency, unspecified: Secondary | ICD-10-CM

## 2013-08-08 DIAGNOSIS — E890 Postprocedural hypothyroidism: Secondary | ICD-10-CM

## 2013-08-08 MED ORDER — LEVOTHYROXINE SODIUM 100 MCG PO TABS: ORAL_TABLET | ORAL | Status: DC

## 2013-08-08 NOTE — Progress Notes (Signed)
Patient ID: Melissa Cox, female   DOB: April 20, 1950, 63 y.o.   MRN: [id number]   Reason for Appointment:  Hypothyroidism, followup visit    History of Present Illness:   The hypothyroidism was first diagnosed in 1977 after treatment of her Graves' disease with I-131  The patient has been treated with levothyroxine in varying doses, more recently ranging from 112 up to 137 mcg The last visit was in 2/15 but because she had not taken her medication for a week her labs were not checked and she is here for followup   On the last visit the dose was continued but she says that initially was starting to have some heart fluttering occasionally for the first month. She previously was on brand name Synthroid and on this visit was given generic  Currently has no complaints of unusual fatigue, cold sensitivity or weight change           Not taking any calcium or iron supplements with the thyroid supplement which she takes before breakfast.    Lab Results  Component Value Date   FREET4 1.09 08/06/2013   TSH 0.17* 08/06/2013       Medication List       This list is accurate as of: 08/08/13  2:22 PM.  Always use your most recent med list.               levothyroxine 112 MCG tablet  Commonly known as:  SYNTHROID, LEVOTHROID  Take one tablet by mouth daily before breakfast        Allergies:  Allergies  Allergen Reactions  . Diphenhydramine Hcl     REACTION: abd cramps and diarrhea  . Meperidine Hcl     REACTION: rash  . Prochlorperazine Edisylate     REACTION: increased heart rate    Past Medical History  Diagnosis Date  . Unspecified essential hypertension 10/21/2008  . Thyrotoxicosis without mention of goiter or other cause, without mention of thyrotoxic crisis or storm     Past Surgical History  Procedure Laterality Date  . Abdominal hysterectomy  8850,2774    partial, fibroids  . Breast lumpectomy      right,benign    No family history on file.  Social History:   reports that she has never smoked. She has never used smokeless tobacco. She reports that she drinks alcohol. She reports that she does not use illicit drugs.  REVIEW Of SYSTEMS:  Wt Readings from Last 3 Encounters:  08/08/13 188 lb 9.6 oz (85.548 kg)  05/17/13 189 lb 9.6 oz (86.002 kg)  03/25/13 186 lb 4 oz (84.482 kg)    Off Lotensin; previously was treated for hypertension. She thinks it was causing headaches  Her previous vitamin D level was 18 and was told to start vitamin D, 5000 units on her last visit     Examination:   BP 128/82  Pulse 87  Temp(Src) 98.3 F (36.8 C)  Resp 14  Ht 5\' 5"  (1.651 m)  Wt 188 lb 9.6 oz (85.548 kg)  BMI 31.38 kg/m2  SpO2 94%  GENERAL APPEARANCE: Alert, no puffiness of the face or eyes  NECK: Thyroid is not palpable           NEUROLOGIC EXAM:  biceps reflexes show normal relaxation Skin: Not unusual dry    Assessment   Hypothyroidism, post ablative, long-standing. Although subjectively she is doing fairly well recently she has a low TSH and did have some palpitations initially with starting this dose  Appears to be needing lower doses of supplements now She prefers the brand name supplement also  History of vitamin D deficiency   Treatment:  She is to go back to brand name Synthroid and change to 100 mcg daily Will check her TSH in 6 weeks and if normal followup in 6 months Recheck vitamin D level  Elayne Snare 08/08/2013, 2:22 PM

## 2013-09-19 ENCOUNTER — Other Ambulatory Visit: Payer: 59

## 2013-11-15 ENCOUNTER — Ambulatory Visit (INDEPENDENT_AMBULATORY_CARE_PROVIDER_SITE_OTHER): Payer: 59 | Admitting: Family Medicine

## 2013-11-15 ENCOUNTER — Encounter: Payer: Self-pay | Admitting: Family Medicine

## 2013-11-15 VITALS — BP 152/94 | HR 91 | Temp 98.9°F | Wt 188.5 lb

## 2013-11-15 DIAGNOSIS — IMO0002 Reserved for concepts with insufficient information to code with codable children: Secondary | ICD-10-CM

## 2013-11-15 DIAGNOSIS — L03019 Cellulitis of unspecified finger: Secondary | ICD-10-CM

## 2013-11-15 DIAGNOSIS — L03012 Cellulitis of left finger: Secondary | ICD-10-CM

## 2013-11-15 DIAGNOSIS — S90852A Superficial foreign body, left foot, initial encounter: Secondary | ICD-10-CM

## 2013-11-15 MED ORDER — SULFAMETHOXAZOLE-TMP DS 800-160 MG PO TABS
2.0000 | ORAL_TABLET | Freq: Two times a day (BID) | ORAL | Status: DC
Start: 2013-11-15 — End: 2014-02-07

## 2013-11-15 NOTE — Progress Notes (Signed)
Pre visit review using our clinic review tool, if applicable. No additional management support is needed unless otherwise documented below in the visit note.  Noted a splinter in the L foot.  She couldn't get it out.  Not sore unless she presses on it.  Tetanus 2007.    L 3rd finger, nail infection, lateral side. Noted recently, worse today.  Red puffy and sore.  No drainage yet.    Meds, vitals, and allergies reviewed.   ROS: See HPI.  Otherwise, noncontributory.  nad L 3rd finger, with irritated nail bed, on the lateral side. Red puffy and sore.  No drainage.  No fluctuance.  L foot with splinter noted, cleaned with etoh.  couldn't be removed with tweezers or needed with magnification.   Area numbed with 1% lidocaine w/o epi, good effect. FB removed, reinspection w/o FB.   No complications, but 1 single drop of puss noted during FB removal.  No fluctuance o/w.

## 2013-11-15 NOTE — Patient Instructions (Signed)
Start the antibiotics and keep your foot clean/bandaged.  Warm soaks for your finger.  Take care.

## 2013-11-17 DIAGNOSIS — S90859A Superficial foreign body, unspecified foot, initial encounter: Secondary | ICD-10-CM | POA: Insufficient documentation

## 2013-11-17 NOTE — Assessment & Plan Note (Signed)
Small splinter, now removed.  Tolerated well.  Since drop of pus noted.  No need for further I&D. Start septa. If any spreading redness, then notify us.  Keep clean and bandaged.  She agrees.  >25 minutes spent in face to face time with patient, working on FB removal.

## 2013-11-17 NOTE — Assessment & Plan Note (Signed)
Warm soaks, septra, f/u prn.

## 2014-01-30 ENCOUNTER — Other Ambulatory Visit: Payer: Self-pay | Admitting: Endocrinology

## 2014-02-04 ENCOUNTER — Other Ambulatory Visit (INDEPENDENT_AMBULATORY_CARE_PROVIDER_SITE_OTHER): Payer: 59

## 2014-02-04 DIAGNOSIS — E890 Postprocedural hypothyroidism: Secondary | ICD-10-CM

## 2014-02-04 LAB — T4, FREE: FREE T4: 0.92 ng/dL (ref 0.60–1.60)

## 2014-02-04 LAB — TSH: TSH: 4.62 u[IU]/mL — ABNORMAL HIGH (ref 0.35–4.50)

## 2014-02-07 ENCOUNTER — Ambulatory Visit (INDEPENDENT_AMBULATORY_CARE_PROVIDER_SITE_OTHER): Payer: 59 | Admitting: Endocrinology

## 2014-02-07 ENCOUNTER — Encounter: Payer: Self-pay | Admitting: Endocrinology

## 2014-02-07 VITALS — BP 138/84 | HR 76 | Temp 98.0°F | Resp 14 | Ht 65.0 in | Wt 191.4 lb

## 2014-02-07 DIAGNOSIS — E890 Postprocedural hypothyroidism: Secondary | ICD-10-CM

## 2014-02-07 DIAGNOSIS — Z23 Encounter for immunization: Secondary | ICD-10-CM

## 2014-02-07 DIAGNOSIS — E559 Vitamin D deficiency, unspecified: Secondary | ICD-10-CM

## 2014-02-07 HISTORY — DX: Postprocedural hypothyroidism: E89.0

## 2014-02-07 NOTE — Progress Notes (Signed)
Patient ID: Melissa Cox, female   DOB: September 21, 1950, 63 y.o.   MRN: [id number]   Reason for Appointment:  Hypothyroidism, followup visit    History of Present Illness:   The hypothyroidism was first diagnosed in 1977 after treatment of her Graves' disease with I-131  The patient has been treated with levothyroxine in varying doses, ranging from 100 up to 137 mcg The last visit was in 5/15 At that time she had a low TSH level and had gone on generic levothyroxine instead of Synthroid brand.  She was taking 112 g and the dose was reduced to 100 g At that time she had been asymptomatic and did not notice a change with reducing her dose  Currently has no complaints of unusual fatigue, cold sensitivity or weight change           Not taking any calcium or iron supplements with the thyroid supplement which she takes before breakfast and has been quite compliant with this lately.    Lab Results  Component Value Date   FREET4 0.92 02/04/2014   FREET4 1.09 08/06/2013   TSH 4.62* 02/04/2014   TSH 0.17* 08/06/2013       Medication List       This list is accurate as of: 02/07/14  8:37 AM.  Always use your most recent med list.               levothyroxine 100 MCG tablet  Commonly known as:  SYNTHROID, LEVOTHROID  TAKE ONE TABLET BY MOUTH DAILY BEFORE BREAKFAST        Allergies:  Allergies  Allergen Reactions  . Diphenhydramine Hcl     REACTION: abd cramps and diarrhea  . Meperidine Hcl     REACTION: rash  . Prochlorperazine Edisylate     REACTION: increased heart rate    Past Medical History  Diagnosis Date  . Unspecified essential hypertension 10/21/2008  . Thyrotoxicosis without mention of goiter or other cause, without mention of thyrotoxic crisis or storm     Past Surgical History  Procedure Laterality Date  . Abdominal hysterectomy  0539,7673    partial, fibroids  . Breast lumpectomy      right,benign    No family history on file.  Social History:   reports that she has never smoked. She has never used smokeless tobacco. She reports that she drinks alcohol. She reports that she does not use illicit drugs.  REVIEW Of SYSTEMS:  Wt Readings from Last 3 Encounters:  02/07/14 191 lb 6.4 oz (86.818 kg)  11/15/13 188 lb 8 oz (85.503 kg)  08/08/13 188 lb 9.6 oz (85.548 kg)    Off Lotensin; previously was treated for hypertension. She thinks it was causing headaches  Her previous vitamin D level was 18 and was told to start vitamin D but still has not taken any     Examination:   BP 138/84 mmHg  Pulse 76  Temp(Src) 98 F (36.7 C)  Resp 14  Ht 5\' 5"  (1.651 m)  Wt 191 lb 6.4 oz (86.818 kg)  BMI 31.85 kg/m2  SpO2 95%  GENERAL APPEARANCE:looks well, no puffiness of the face or eyes NEUROLOGIC EXAM:  biceps reflexes show normal relaxation Skin: Not unusual dry, no peripheral edema    Assessment   Hypothyroidism, post ablative, long-standing. Although subjectively she is doing fairly well recently she has a slightly high  TSH with the current dose of 100 g Not clear if the change in her levels is related  to switching from brand name to generic  Subjectively doing well and is compliant with her Synthroid She prefers the brand name supplement but currently has a 90 day supply of the generic  History of significant vitamin D deficiency   Treatment:  She will use up her current prescription using extra half tablet twice a week On the next prescription will need a brand name Synthroid Will check her TSH in 8 weeks and if normal followup in 6 months  Recheck vitamin D level on the next visit Vitamin D3 2000 units daily  Karman Biswell 02/07/2014, 8:37 AM

## 2014-02-07 NOTE — Patient Instructions (Signed)
Vitamin D3 2000 units daily  Synthroid: on Sundays and Wednesdays take extra 1/2 pill

## 2014-04-04 ENCOUNTER — Other Ambulatory Visit (INDEPENDENT_AMBULATORY_CARE_PROVIDER_SITE_OTHER): Payer: 59

## 2014-04-04 DIAGNOSIS — E890 Postprocedural hypothyroidism: Secondary | ICD-10-CM

## 2014-04-04 DIAGNOSIS — E559 Vitamin D deficiency, unspecified: Secondary | ICD-10-CM

## 2014-04-04 LAB — TSH: TSH: 1.4 u[IU]/mL (ref 0.35–4.50)

## 2014-04-04 LAB — VITAMIN D 25 HYDROXY (VIT D DEFICIENCY, FRACTURES): VITD: 18.52 ng/mL — ABNORMAL LOW (ref 30.00–100.00)

## 2014-05-16 ENCOUNTER — Ambulatory Visit: Payer: 59 | Admitting: Endocrinology

## 2014-05-20 ENCOUNTER — Other Ambulatory Visit: Payer: 59

## 2014-05-31 ENCOUNTER — Other Ambulatory Visit: Payer: Self-pay | Admitting: Endocrinology

## 2014-07-08 ENCOUNTER — Ambulatory Visit (INDEPENDENT_AMBULATORY_CARE_PROVIDER_SITE_OTHER): Payer: 59 | Admitting: Family Medicine

## 2014-07-08 ENCOUNTER — Encounter: Payer: Self-pay | Admitting: Family Medicine

## 2014-07-08 VITALS — BP 122/82 | HR 88 | Temp 98.7°F | Wt 192.2 lb

## 2014-07-08 DIAGNOSIS — R21 Rash and other nonspecific skin eruption: Secondary | ICD-10-CM

## 2014-07-08 MED ORDER — DOXYCYCLINE HYCLATE 100 MG PO TABS: 100.0000 mg | ORAL_TABLET | Freq: Two times a day (BID) | ORAL | Status: DC

## 2014-07-08 NOTE — Progress Notes (Signed)
Pre visit review using our clinic review tool, if applicable. No additional management support is needed unless otherwise documented below in the visit note.  Possible "bug bite".  No clear ID on the insect at the sites of the skin changes.  Locally itchy, along the L trap, mildly itchy.  No FCNAVD.  No joint aches other than baseline OA.   She has a similar lesion on the R lower abd but it isn't itchy.   She had an abdominal tick bite in a different location recently.    Meds, vitals, and allergies reviewed.   ROS: See HPI.  Otherwise, noncontributory.  nad ncat Mmm Neck supple, no LA rrr ctab abd soft Skin with 9x6cm blanching warm pinkish area on the L trap.  R lower abd with 8x6cm blanching warm pinkish area with central clearing.

## 2014-07-08 NOTE — Patient Instructions (Signed)
Start doxycycline today, notify us if the rashes get bigger.   Use hydrocortisone topically if needed for itching.  Take care.

## 2014-07-09 DIAGNOSIS — R21 Rash and other nonspecific skin eruption: Secondary | ICD-10-CM | POA: Insufficient documentation

## 2014-07-09 NOTE — Assessment & Plan Note (Signed)
We should treat for cellulitis and for the tick exposure, in the event that this is tick related.  Doxy should cover both.  We talked about the rationale for not checking tick labs at this point, but just treating.  She agrees, since the labs wouldn't change the plan.  She can use otc hydrocortisone locally if needed.  Call back as needed. Routed to PCP as FYI.

## 2014-08-05 ENCOUNTER — Other Ambulatory Visit (INDEPENDENT_AMBULATORY_CARE_PROVIDER_SITE_OTHER): Payer: 59

## 2014-08-05 DIAGNOSIS — E890 Postprocedural hypothyroidism: Secondary | ICD-10-CM

## 2014-08-05 LAB — TSH: TSH: 6.84 u[IU]/mL — ABNORMAL HIGH (ref 0.35–4.50)

## 2014-08-08 ENCOUNTER — Ambulatory Visit (INDEPENDENT_AMBULATORY_CARE_PROVIDER_SITE_OTHER): Payer: 59 | Admitting: Endocrinology

## 2014-08-08 ENCOUNTER — Encounter: Payer: Self-pay | Admitting: Endocrinology

## 2014-08-08 VITALS — BP 132/82 | HR 84 | Temp 97.8°F | Ht 65.0 in | Wt 191.0 lb

## 2014-08-08 DIAGNOSIS — E890 Postprocedural hypothyroidism: Secondary | ICD-10-CM | POA: Diagnosis not present

## 2014-08-08 DIAGNOSIS — E559 Vitamin D deficiency, unspecified: Secondary | ICD-10-CM | POA: Diagnosis not present

## 2014-08-08 MED ORDER — LEVOTHYROXINE SODIUM 112 MCG PO TABS: 112.0000 ug | ORAL_TABLET | Freq: Every day | ORAL | Status: DC

## 2014-08-08 NOTE — Progress Notes (Signed)
Patient ID: Melissa Cox, female   DOB: [birth year], 64 y.o.   MRN: [id number]   Reason for Appointment:  Hypothyroidism, followup visit    History of Present Illness:   The hypothyroidism was first diagnosed in 1977 after treatment of her Graves' disease with I-131  The patient has been treated with levothyroxine in varying doses, ranging from 100 up to 137 mcg The last visit was in 11/15 At that time she had a slightly high TSH level with using generic levothyroxine Previously had been told to reduce her dose from 112 down to 100 g At that time she had been asymptomatic and did not notice a change with increasing her regimen to 7 tablets a week with extra half twice a week  However for some reason in the last few months she has forgotten to take the extra half tablet twice a week and is only taking 1 tablet daily  Currently has started having complaints of unusual fatigue for some time which she thought was from aging  No cold sensitivity or weight change           Not taking any calcium or iron supplements with the thyroid supplement which she takes before breakfast and has been quite compliant with this again  Lab Results  Component Value Date   FREET4 0.92 02/04/2014   FREET4 1.09 08/06/2013   TSH 6.84* 08/05/2014   TSH 1.40 04/04/2014   TSH 4.62* 02/04/2014   Wt Readings from Last 3 Encounters:  08/08/14 191 lb (86.637 kg)  07/08/14 192 lb 4 oz (87.204 kg)  02/07/14 191 lb 6.4 oz (86.818 kg)       Medication List       This list is accurate as of: 08/08/14  8:40 AM.  Always use your most recent med list.               doxycycline 100 MG tablet  Commonly known as:  VIBRA-TABS  Take 1 tablet (100 mg total) by mouth 2 (two) times daily.     levothyroxine 100 MCG tablet  Commonly known as:  SYNTHROID, LEVOTHROID  TAKE ONE TABLET BY MOUTH DAILY BEFORE BREAKFAST        Allergies:  Allergies  Allergen Reactions  . Diphenhydramine Hcl     REACTION: abd  cramps and diarrhea  . Meperidine Hcl     REACTION: rash  . Prochlorperazine Edisylate     REACTION: increased heart rate    Past Medical History  Diagnosis Date  . Unspecified essential hypertension 10/21/2008  . Thyrotoxicosis without mention of goiter or other cause, without mention of thyrotoxic crisis or storm     Past Surgical History  Procedure Laterality Date  . Abdominal hysterectomy  0932,3557    partial, fibroids  . Breast lumpectomy      right,benign    No family history on file.  Social History:  reports that she has never smoked. She has never used smokeless tobacco. She reports that she drinks alcohol. She reports that she does not use illicit drugs.  REVIEW Of SYSTEMS:  Wt Readings from Last 3 Encounters:  08/08/14 191 lb (86.637 kg)  07/08/14 192 lb 4 oz (87.204 kg)  02/07/14 191 lb 6.4 oz (86.818 kg)    Not being treated by hypertension currently  Her previous vitamin D level was 18 and has been taking 5000 units daily  Asking about occasional burping associated with this transient slight discomfort in the chest relieved by burping  Examination:   BP 132/82 mmHg  Pulse 84  Temp(Src) 97.8 F (36.6 C) (Oral)  Ht 5\' 5"  (1.651 m)  Wt 191 lb (86.637 kg)  BMI 31.78 kg/m2  SpO2 95%  GENERAL APPEARANCE:looks well, no swelling of the eyes No peripheral edema NEUROLOGIC EXAM:  biceps reflexes show normal relaxation Skin: Not unusual dry    Assessment   Hypothyroidism, post ablative, long-standing. Although she had a normal TSH in 1/16 with taking 8 tablets a week of the 100 g she has forgotten to take the extra 100 g weekly  She is complaining of some fatigue and her TSH is significantly high at 6.8 She prefers the brand name supplement and probably will do more consistently well with this  Vitamin D deficiency: She is taking a supplement   History of significant vitamin D deficiency   Treatment:  She will change to brand name 112 g  Synthroid  Follow-up in 2 months  Follow-up with PCP for occasional reflux-like symptoms  Recheck vitamin D level on the next visit Vitamin D3 2000 units daily  Melissa Cox 08/08/2014, 8:40 AM

## 2014-08-26 ENCOUNTER — Other Ambulatory Visit: Payer: Self-pay | Admitting: Endocrinology

## 2014-10-03 ENCOUNTER — Ambulatory Visit (INDEPENDENT_AMBULATORY_CARE_PROVIDER_SITE_OTHER): Payer: 59 | Admitting: Nurse Practitioner

## 2014-10-03 VITALS — BP 144/88 | HR 83 | Temp 98.4°F | Resp 14 | Ht 65.0 in | Wt 190.0 lb

## 2014-10-03 DIAGNOSIS — H6981 Other specified disorders of Eustachian tube, right ear: Secondary | ICD-10-CM

## 2014-10-03 MED ORDER — FLUTICASONE PROPIONATE 50 MCG/ACT NA SUSP: 2.0000 | Freq: Every day | NASAL | Status: DC

## 2014-10-03 NOTE — Progress Notes (Signed)
   Subjective:    Patient ID: Melissa Cox, female    DOB: 03/27/1951, 64 y.o.   MRN: [id number]  HPI  [name] is a 64 yo female of ear pain on right for over 1 month intermittently.   1) More persistent over the past few days, dull pressure ache, dull headache, yawn or swallow- no popping just increased pressure. Started after mowing the yard  Pressure, fullness of ear   Goody's powder- helped for awhile only tx to date  Denies pain with chewing, jaw popping   Review of Systems  Constitutional: Negative for fever, chills, diaphoresis and fatigue.  HENT: Positive for ear pain. Negative for congestion, ear discharge, hearing loss, postnasal drip, rhinorrhea, sinus pressure, sneezing, sore throat and tinnitus.   Eyes: Negative for visual disturbance.  Respiratory: Negative for cough.   Cardiovascular: Negative for chest pain, palpitations and leg swelling.  Gastrointestinal: Negative for nausea, vomiting and diarrhea.  Musculoskeletal: Negative for myalgias and arthralgias.  Skin: Negative for rash.  Neurological: Positive for headaches. Negative for dizziness and light-headedness.      Objective:   Physical Exam  Constitutional: She is oriented to person, place, and time. She appears well-developed and well-nourished. No distress.  BP 144/88 mmHg  Pulse 83  Temp(Src) 98.4 F (36.9 C)  Resp 14  Ht 5\' 5"  (1.651 m)  Wt 190 lb (86.183 kg)  BMI 31.62 kg/m2  SpO2 96%   HENT:  Head: Normocephalic and atraumatic.  Right Ear: External ear normal.  Left Ear: External ear normal.  TM's clear bilaterally  Eyes: EOM are normal. Pupils are equal, round, and reactive to light. Right eye exhibits no discharge. Left eye exhibits no discharge. No scleral icterus.  Neck: Normal range of motion. Neck supple. No thyromegaly present.  Cardiovascular: Normal rate, regular rhythm and normal heart sounds.  Exam reveals no gallop and no friction rub.   No murmur heard. Pulmonary/Chest:  Effort normal and breath sounds normal. No respiratory distress. She has no wheezes. She has no rales. She exhibits no tenderness.  Lymphadenopathy:    She has no cervical adenopathy.  Neurological: She is alert and oriented to person, place, and time. No cranial nerve deficit. She exhibits normal muscle tone. Coordination normal.  Skin: Skin is warm and dry. No rash noted. She is not diaphoretic.  Psychiatric: She has a normal mood and affect. Her behavior is normal. Judgment and thought content normal.         Assessment & Plan:

## 2014-10-03 NOTE — Progress Notes (Signed)
Pre visit review using our clinic review tool, if applicable. No additional management support is needed unless otherwise documented below in the visit note. 

## 2014-10-03 NOTE — Patient Instructions (Signed)
Flonase (crossover technique) 2 sprays daily.   Call us Monday or your Doctor's office if not improved.

## 2014-10-07 ENCOUNTER — Other Ambulatory Visit: Payer: 59

## 2014-10-09 ENCOUNTER — Telehealth: Payer: Self-pay | Admitting: Family Medicine

## 2014-10-09 DIAGNOSIS — H9209 Otalgia, unspecified ear: Secondary | ICD-10-CM

## 2014-10-09 NOTE — Telephone Encounter (Signed)
Pt called stating she saw carrie doss in [place] on 7/8 for ear pain.  Her ear is a lot worse and she would like an appointment with ENT.  Offered appointment here but pt wants ent appointment

## 2014-10-09 NOTE — Telephone Encounter (Signed)
i have not seen this patient in 3 years. OV notes not done - appears may have had ETD? ENT consult placed per request.

## 2014-10-10 ENCOUNTER — Ambulatory Visit: Payer: 59 | Admitting: Endocrinology

## 2014-10-10 ENCOUNTER — Telehealth: Payer: Self-pay | Admitting: Family Medicine

## 2014-10-10 NOTE — Telephone Encounter (Signed)
Melissa Cox went to ent  She want to [place] for all the work you did to get her into an ENT quickly!!

## 2014-10-16 ENCOUNTER — Encounter: Payer: Self-pay | Admitting: Nurse Practitioner

## 2014-10-16 DIAGNOSIS — H6980 Other specified disorders of Eustachian tube, unspecified ear: Secondary | ICD-10-CM | POA: Insufficient documentation

## 2014-10-16 NOTE — Assessment & Plan Note (Signed)
Will try nasal saline and steroid. Gave pt information about the technique of spraying into the nostrils. FU prn worsening/failure to improve.

## 2014-10-21 ENCOUNTER — Encounter: Payer: Self-pay | Admitting: *Deleted

## 2014-10-31 ENCOUNTER — Other Ambulatory Visit (INDEPENDENT_AMBULATORY_CARE_PROVIDER_SITE_OTHER): Payer: 59

## 2014-10-31 ENCOUNTER — Other Ambulatory Visit: Payer: Self-pay | Admitting: *Deleted

## 2014-10-31 DIAGNOSIS — E890 Postprocedural hypothyroidism: Secondary | ICD-10-CM

## 2014-10-31 DIAGNOSIS — E559 Vitamin D deficiency, unspecified: Secondary | ICD-10-CM

## 2014-10-31 LAB — VITAMIN D 25 HYDROXY (VIT D DEFICIENCY, FRACTURES): VITD: 23.42 ng/mL — ABNORMAL LOW (ref 30.00–100.00)

## 2014-10-31 LAB — T4, FREE: Free T4: 1.26 ng/dL (ref 0.60–1.60)

## 2014-10-31 LAB — TSH: TSH: 0.82 u[IU]/mL (ref 0.35–4.50)

## 2014-11-07 ENCOUNTER — Other Ambulatory Visit: Payer: Self-pay | Admitting: *Deleted

## 2014-11-07 MED ORDER — LEVOTHYROXINE SODIUM 112 MCG PO TABS: 112.0000 ug | ORAL_TABLET | Freq: Every day | ORAL | Status: DC

## 2014-11-10 ENCOUNTER — Ambulatory Visit (INDEPENDENT_AMBULATORY_CARE_PROVIDER_SITE_OTHER): Payer: 59 | Admitting: Endocrinology

## 2014-11-10 VITALS — BP 122/80 | HR 78 | Temp 98.0°F | Resp 14 | Ht 65.0 in | Wt 187.8 lb

## 2014-11-10 DIAGNOSIS — E890 Postprocedural hypothyroidism: Secondary | ICD-10-CM

## 2014-11-10 DIAGNOSIS — E559 Vitamin D deficiency, unspecified: Secondary | ICD-10-CM | POA: Diagnosis not present

## 2014-11-10 NOTE — Patient Instructions (Signed)
Take 4000-5000 units D3 daily with food  Synthroid same

## 2014-11-10 NOTE — Progress Notes (Signed)
Patient ID: Melissa Cox, female   DOB: 02-26-1951, 64 y.o.   MRN: [id number]   Reason for Appointment:  Hypothyroidism, followup visit    History of Present Illness:   The hypothyroidism was first diagnosed in 1977 after treatment of her Graves' disease with I-131  The patient has been treated with levothyroxine in varying doses, ranging from 100 up to 137 mcg The last visit was in 5/16 At that time she had a high TSH level with using generic levothyroxine At that time she had been complaining of some fatigue and with increasing her dose to 112 g she started having a little better energy She is now taking brand name Synthroid  No cold sensitivity or weight change           Not taking any calcium or iron supplements with the thyroid supplement which she takes at 2 am and has been quite compliant with this again   Lab Results  Component Value Date   TSH 0.82 10/31/2014   TSH 6.84* 08/05/2014   TSH 1.40 04/04/2014   FREET4 1.26 10/31/2014   FREET4 0.92 02/04/2014   FREET4 1.09 08/06/2013    Wt Readings from Last 3 Encounters:  11/10/14 187 lb 12.8 oz (85.186 kg)  10/03/14 190 lb (86.183 kg)  08/08/14 191 lb (86.637 kg)       Medication List       This list is accurate as of: 11/10/14 11:59 PM.  Always use your most recent med list.               levothyroxine 112 MCG tablet  Commonly known as:  SYNTHROID, LEVOTHROID  Take 1 tablet (112 mcg total) by mouth daily.     Vitamin D3 5000 UNITS Caps  Take by mouth.        Allergies:  Allergies  Allergen Reactions  . Diphenhydramine Hcl     REACTION: abd cramps and diarrhea  . Meperidine Hcl     REACTION: rash  . Prochlorperazine Edisylate     REACTION: increased heart rate    Past Medical History  Diagnosis Date  . Unspecified essential hypertension 10/21/2008  . Thyrotoxicosis without mention of goiter or other cause, without mention of thyrotoxic crisis or storm     Past Surgical History  Procedure  Laterality Date  . Abdominal hysterectomy  [id number]    partial, fibroids  . Breast lumpectomy      right,benign    No family history on file.  Social History:  reports that she has never smoked. She has never used smokeless tobacco. She reports that she drinks alcohol. She reports that she does not use illicit drugs.  REVIEW Of SYSTEMS:  Wt Readings from Last 3 Encounters:  11/10/14 187 lb 12.8 oz (85.186 kg)  10/03/14 190 lb (86.183 kg)  08/08/14 191 lb (86.637 kg)    Not being treated  hypertension   Her baseline vitamin D level was 18 and has been taking 2000 units daily, the level is only up to 23  Asking about a hangnail, symptoms are better today   Examination:   BP 122/80 mmHg  Pulse 78  Temp(Src) 98 F (36.7 C)  Resp 14  Ht 5\' 5"  (1.651 m)  Wt 187 lb 12.8 oz (85.186 kg)  BMI 31.25 kg/m2  SpO2 96%  GENERAL APPEARANCE:looks well No peripheral edema Thyroid not palpable NEUROLOGIC EXAM:  biceps reflexes show normal relaxation Skin: Not unusual dry    Assessment   Hypothyroidism, post  ablative, long-standing. Her TSH is now excellent with taking 112 g of brand name Synthroid and she is symptomatically doing well She is compliant with her Synthroid  She prefers the brand name supplement and will continue this  Vitamin D deficiency: She is taking a 2000 units supplement which is inadequate and her level is 23     Treatment:  She will continue brand name 112 g Synthroid  Follow-up in 6 months  Vitamin D3 5000 units daily  Jaymes Hang 11/11/2014, 8:07 AM

## 2015-03-06 ENCOUNTER — Telehealth: Payer: Self-pay | Admitting: *Deleted

## 2015-03-06 NOTE — Telephone Encounter (Signed)
Left message for Dkayla to call the office to schedule her flu shot or let us know if he had one done elsewhere.

## 2015-03-19 ENCOUNTER — Ambulatory Visit (INDEPENDENT_AMBULATORY_CARE_PROVIDER_SITE_OTHER): Payer: 59

## 2015-03-19 DIAGNOSIS — Z23 Encounter for immunization: Secondary | ICD-10-CM

## 2015-05-01 ENCOUNTER — Encounter: Payer: Self-pay | Admitting: Family Medicine

## 2015-05-01 ENCOUNTER — Ambulatory Visit (INDEPENDENT_AMBULATORY_CARE_PROVIDER_SITE_OTHER): Payer: 59 | Admitting: Family Medicine

## 2015-05-01 VITALS — BP 138/88 | HR 98 | Temp 99.1°F | Ht 65.0 in | Wt 193.0 lb

## 2015-05-01 DIAGNOSIS — J320 Chronic maxillary sinusitis: Secondary | ICD-10-CM | POA: Insufficient documentation

## 2015-05-01 DIAGNOSIS — J0100 Acute maxillary sinusitis, unspecified: Secondary | ICD-10-CM | POA: Diagnosis not present

## 2015-05-01 MED ORDER — AMOXICILLIN-POT CLAVULANATE 875-125 MG PO TABS: 1.0000 | ORAL_TABLET | Freq: Two times a day (BID) | ORAL | Status: DC

## 2015-05-01 NOTE — Patient Instructions (Signed)
Nice to meet you. Your symptoms are likely related to a sinus infection. We'll start you on Augmentin for this. You should start Claritin over-the-counter for this as well. Please stay well hydrated. If you develop fevers, shortness of breath, cough productive of blood, or any new or changing symptoms please seek medical attention immediately.

## 2015-05-01 NOTE — Progress Notes (Signed)
Pre visit review using our clinic review tool, if applicable. No additional management support is needed unless otherwise documented below in the visit note. 

## 2015-05-01 NOTE — Assessment & Plan Note (Signed)
Symptoms consistent with maxillary sinusitis given duration and worsening chills. We will treat with Augmentin. Advised to take Claritin as well. Well-hydrated. Advised on probiotics or yogurt while on the antibiotic. Return precautions given.

## 2015-05-01 NOTE — Progress Notes (Signed)
Patient ID: Melissa Cox, female   DOB: Mar 16, 1951, 65 y.o.   MRN: [id number]  Tommi Rumps, MD Phone: [phone]  Melissa Cox is a 65 y.o. female who presents today for same day visit.  Patient notes onset of symptoms on Sunday. Notes started with congestion and postnasal drip. She worsened on Wednesday and her teeth began to hurt and she began have pressure in her maxillary sinuses. She notes she has had chills with this. She has not checked her temperature. She notes postnasal drip with phlegm in her throat. No shortness of breath. Mild cough that is not productive. She does note sick contacts. She's been seen in the past for similar symptoms by ear nose and throat diagnosis sinus infections. She has taken NyQuil and DayQuil for this.  PMH: nonsmoker.   ROS see history of present illness  Objective  Physical Exam Filed Vitals:   05/01/15 1514  BP: 138/88  Pulse: 98  Temp: 99.1 F (37.3 C)    BP Readings from Last 3 Encounters:  05/01/15 138/88  11/10/14 122/80  10/03/14 144/88   Wt Readings from Last 3 Encounters:  05/01/15 193 lb (87.544 kg)  11/10/14 187 lb 12.8 oz (85.186 kg)  10/03/14 190 lb (86.183 kg)    Physical Exam  Constitutional: No distress.  Tired-appearing, nontoxic  HENT:  Head: Normocephalic and atraumatic.  Right Ear: External ear normal.  Left Ear: External ear normal.  Mild posterior oropharyngeal erythema with no exudate, normal TMs bilaterally, tenderness to percussion on bilateral maxillary sinuses  Eyes: Conjunctivae are normal. Pupils are equal, round, and reactive to light.  Neck: Neck supple.  Cardiovascular: Normal rate, regular rhythm and normal heart sounds.  Exam reveals no gallop and no friction rub.   No murmur heard. Pulmonary/Chest: Effort normal and breath sounds normal. No respiratory distress. She has no wheezes. She has no rales.  Lymphadenopathy:    She has no cervical adenopathy.  Neurological: She is alert. Gait  normal.  Skin: Skin is warm and dry. She is not diaphoretic.     Assessment/Plan: Please see individual problem list.  Maxillary sinusitis Symptoms consistent with maxillary sinusitis given duration and worsening chills. We will treat with Augmentin. Advised to take Claritin as well. Well-hydrated. Advised on probiotics or yogurt while on the antibiotic. Return precautions given.    Meds ordered this encounter  Medications  . amoxicillin-clavulanate (AUGMENTIN) 875-125 MG tablet    Sig: Take 1 tablet by mouth 2 (two) times daily.    Dispense:  14 tablet    Refill:  0    Tommi Rumps

## 2015-05-05 ENCOUNTER — Ambulatory Visit (INDEPENDENT_AMBULATORY_CARE_PROVIDER_SITE_OTHER): Payer: 59 | Admitting: Family Medicine

## 2015-05-05 ENCOUNTER — Encounter: Payer: Self-pay | Admitting: Family Medicine

## 2015-05-05 VITALS — BP 110/70 | HR 92 | Temp 98.2°F | Ht 65.0 in | Wt 187.5 lb

## 2015-05-05 DIAGNOSIS — R05 Cough: Secondary | ICD-10-CM

## 2015-05-05 DIAGNOSIS — R059 Cough, unspecified: Secondary | ICD-10-CM

## 2015-05-05 LAB — POCT INFLUENZA A/B
INFLUENZA A, POC: NEGATIVE
INFLUENZA B, POC: NEGATIVE

## 2015-05-05 MED ORDER — GUAIFENESIN-CODEINE 100-10 MG/5ML PO SYRP: 5.0000 mL | ORAL_SOLUTION | Freq: Every evening | ORAL | Status: DC | PRN

## 2015-05-05 NOTE — Progress Notes (Signed)
   Subjective:    Patient ID: Melissa Cox, female    DOB: 1950-07-05, 65 y.o.   MRN: [id number]  HPI   65 year old female presents for follow up sinusitis. She was seen by Dr. Josephina Gip on 2/3 for acute maxillary sinusitis. At that time she had several days of congestion, postanasal drip  That progressed to facial pain and pressure, no fever. Started on Augmentin and Claritin.  Today she reports on day 4/10 of Augmentin, she reports that she has continued to worsen. Body aches, throat burning, nasal congestion worse, chills, no measured fever. Productive cough, no SOB, no wheeze. Fatigued. No facial pain, gums still hurt on top teeth.  No sick contact.  She did get a flu shot this year.  Social History /Family History/Past Medical History reviewed and updated if needed.  Nonsmoker No chronic lung issues. Review of Systems  Constitutional: Negative for fever and fatigue.  HENT: Negative for ear pain.   Eyes: Negative for pain.  Respiratory: Negative for chest tightness and shortness of breath.   Cardiovascular: Negative for chest pain, palpitations and leg swelling.  Gastrointestinal: Negative for abdominal pain.  Genitourinary: Negative for dysuria.       Objective:   Physical Exam  Constitutional: Vital signs are normal. She appears well-developed and well-nourished. She is cooperative.  Non-toxic appearance. She does not appear ill. No distress.  Ill appearing   HENT:  Head: Normocephalic.  Right Ear: Hearing, external ear and ear canal normal. Tympanic membrane is not erythematous, not retracted and not bulging. A middle ear effusion is present.  Left Ear: Hearing, external ear and ear canal normal. Tympanic membrane is not erythematous, not retracted and not bulging. A middle ear effusion is present.  Nose: Mucosal edema present. No rhinorrhea. Right sinus exhibits no maxillary sinus tenderness and no frontal sinus tenderness. Left sinus exhibits no maxillary sinus  tenderness and no frontal sinus tenderness.  Mouth/Throat: Uvula is midline and mucous membranes are normal. Posterior oropharyngeal erythema present. No oropharyngeal exudate or posterior oropharyngeal edema.  Post nasal drip  Eyes: Conjunctivae, EOM and lids are normal. Pupils are equal, round, and reactive to light. Lids are everted and swept, no foreign bodies found.  Neck: Trachea normal and normal range of motion. Neck supple. Carotid bruit is not present. No thyroid mass and no thyromegaly present.  Cardiovascular: Normal rate, regular rhythm, S1 normal, S2 normal, normal heart sounds, intact distal pulses and normal pulses.  Exam reveals no gallop and no friction rub.   No murmur heard. Pulmonary/Chest: Effort normal and breath sounds normal. No tachypnea. No respiratory distress. She has no decreased breath sounds. She has no wheezes. She has no rhonchi. She has no rales.  Abdominal: Soft. Normal appearance and bowel sounds are normal. There is no tenderness.  Neurological: She is alert.  Skin: Skin is warm, dry and intact. No rash noted.  Psychiatric: Her speech is normal and behavior is normal. Judgment and thought content normal. Her mood appears not anxious. Cognition and memory are normal. She does not exhibit a depressed mood.          Assessment & Plan:

## 2015-05-05 NOTE — Progress Notes (Signed)
Pre visit review using our clinic review tool, if applicable. No additional management support is needed unless otherwise documented below in the visit note. 

## 2015-05-05 NOTE — Patient Instructions (Addendum)
Continue aAugmentin course.  Start Mucinex  DM during the day. At night you can use a prescription cough suppressant.  Push fluids and rest.  Call if not improving as expected.

## 2015-05-08 ENCOUNTER — Other Ambulatory Visit: Payer: 59

## 2015-05-13 ENCOUNTER — Ambulatory Visit: Payer: 59 | Admitting: Endocrinology

## 2015-07-12 ENCOUNTER — Other Ambulatory Visit: Payer: Self-pay | Admitting: Endocrinology

## 2015-07-21 ENCOUNTER — Encounter: Payer: Self-pay | Admitting: Internal Medicine

## 2015-07-21 ENCOUNTER — Ambulatory Visit (INDEPENDENT_AMBULATORY_CARE_PROVIDER_SITE_OTHER): Payer: 59 | Admitting: Internal Medicine

## 2015-07-21 ENCOUNTER — Ambulatory Visit (INDEPENDENT_AMBULATORY_CARE_PROVIDER_SITE_OTHER)
Admission: RE | Admit: 2015-07-21 | Discharge: 2015-07-21 | Disposition: A | Discharge status: Home / Self Care | Payer: 59 | Source: Ambulatory Visit | Attending: Internal Medicine | Admitting: Internal Medicine

## 2015-07-21 VITALS — BP 120/78 | HR 77 | Temp 98.1°F | Wt 188.0 lb

## 2015-07-21 DIAGNOSIS — S99921A Unspecified injury of right foot, initial encounter: Secondary | ICD-10-CM

## 2015-07-21 NOTE — Patient Instructions (Signed)
Elastic Bandage and RICE °WHAT DOES AN ELASTIC BANDAGE DO? °Elastic bandages come in different shapes and sizes. They generally provide support to your injury and reduce swelling while you are healing, but they can perform different functions. Your health care provider will help you to decide what is best for your protection, recovery, or rehabilitation following an injury. °WHAT ARE SOME GENERAL TIPS FOR USING AN ELASTIC BANDAGE? °· Use the bandage as directed by the maker of the bandage that you are using. °· Do not wrap the bandage too tightly. This may cut off the circulation in the arm or leg in the area below the bandage. °¨ If part of your body beyond the bandage becomes blue, numb, cold, swollen, or is more painful, your bandage is most likely too tight. If this occurs, remove your bandage and reapply it more loosely. °· See your health care provider if the bandage seems to be making your problems worse rather than better. °· An elastic bandage should be removed and reapplied every 3-4 hours or as directed by your health care provider. °WHAT IS RICE? °The routine care of many injuries includes rest, ice, compression, and elevation (RICE therapy).  °Rest °Rest is required to allow your body to heal. Generally, you can resume your routine activities when you are comfortable and have been given permission by your health care provider. °Ice °Icing your injury helps to keep the swelling down and it reduces pain. Do not apply ice directly to your skin. °· Put ice in a plastic bag. °· Place a towel between your skin and the bag. °· Leave the ice on for 20 minutes, 2-3 times per day. °Do this for as long as you are directed by your health care provider. °Compression °Compression helps to keep swelling down, gives support, and helps with discomfort. Compression may be done with an elastic bandage. °Elevation °Elevation helps to reduce swelling and it decreases pain. If possible, your injured area should be placed at  or above the level of your heart or the center of your chest. °WHEN SHOULD I SEEK MEDICAL CARE? °You should seek medical care if: °· You have persistent pain and swelling. °· Your symptoms are getting worse rather than improving. °These symptoms may indicate that further evaluation or further X-rays are needed. Sometimes, X-rays may not show a small broken bone (fracture) until a number of days later. Make a follow-up appointment with your health care provider. Ask when your X-ray results will be ready. Make sure that you get your X-ray results. °WHEN SHOULD I SEEK IMMEDIATE MEDICAL CARE? °You should seek immediate medical care if: °· You have a sudden onset of severe pain at or below the area of your injury. °· You develop redness or increased swelling around your injury. °· You have tingling or numbness at or below the area of your injury that does not improve after you remove the elastic bandage. °  °This information is not intended to replace advice given to you by your health care provider. Make sure you discuss any questions you have with your health care provider. °  °Document Released: 09/03/2001 Document Revised: 12/03/2014 Document Reviewed: 10/28/2013 °Elsevier Interactive Patient Education ©2016 [employer] ° °

## 2015-07-21 NOTE — Progress Notes (Signed)
   Subjective:    Patient ID: Melissa Cox, female    DOB: 1951/02/04, 65 y.o.   MRN: [id number]  HPI  Pt presents to the clinic today for injury of her right foot. 10 days ago, she dropped a wrench on her foot. She currently has pain and swelling in her right foot just proximal to the 4th and 5th digits. She reports pain with palpation and painn with flexion, extension and adduction. She denies pain with weight bearing, but is unable to tolerate shoes. She took Tylenol for a few days, but has not tried any ice or other OTC pain meds.   Review of Systems   Past Medical History  Diagnosis Date  . Unspecified essential hypertension 10/21/2008  . Thyrotoxicosis without mention of goiter or other cause, without mention of thyrotoxic crisis or storm     Current Outpatient Prescriptions  Medication Sig Dispense Refill  . SYNTHROID 112 MCG tablet TAKE 1 TABLET (112 MCG TOTAL) BY MOUTH DAILY. 15 tablet 0  . Cholecalciferol (VITAMIN D3) 5000 UNITS CAPS Take by mouth. Reported on 07/21/2015     No current facility-administered medications for this visit.    Allergies  Allergen Reactions  . Diphenhydramine Hcl     REACTION: abd cramps and diarrhea  . Meperidine Hcl     REACTION: rash  . Prochlorperazine Edisylate     REACTION: increased heart rate    No family history on file.  Social History   Social History  . Marital Status: Married    Spouse Name: N/A  . Number of Children: N/A  . Years of Education: N/A   Occupational History  . Not on file.   Social History Main Topics  . Smoking status: Never Smoker   . Smokeless tobacco: Never Used  . Alcohol Use: Yes     Comment: occ glass of wine  . Drug Use: No  . Sexual Activity: Not on file   Other Topics Concern  . Not on file   Social History Narrative     Musculoskeletal: Pt reports pain and swelling in her right foot with some bruising. Denies decrease in range of motion or difficulty with gait.   No other  specific complaints in a complete review of systems (except as listed in HPI above).     Objective:   Physical Exam  BP 120/78 mmHg  Pulse 77  Temp(Src) 98.1 F (36.7 C) (Oral)  Wt 188 lb (85.276 kg)  SpO2 98% Wt Readings from Last 3 Encounters:  07/21/15 188 lb (85.276 kg)  05/05/15 187 lb 8 oz (85.049 kg)  05/01/15 193 lb (87.544 kg)    General: Appears her stated age, well developed, well nourished in NAD. Musculoskeletal: TTP of right foot between the 4th and 5th digits. Some swelling and contusions. Pain with flexion, extension, and adduction. No TTP of the metatarsals. No difficulty with gait.  Neurological: + sensation in all 5 digits of the R foot.         Assessment & Plan:   Right foot injury:  Xray of the Right foot today Walking shoe applied in office Continue Tylenol, elevation and ice  Will follow up after xray, RTC as needed

## 2015-07-21 NOTE — Progress Notes (Signed)
Pre visit review using our clinic review tool, if applicable. No additional management support is needed unless otherwise documented below in the visit note. 

## 2015-07-23 ENCOUNTER — Other Ambulatory Visit (INDEPENDENT_AMBULATORY_CARE_PROVIDER_SITE_OTHER): Payer: 59

## 2015-07-23 DIAGNOSIS — E890 Postprocedural hypothyroidism: Secondary | ICD-10-CM

## 2015-07-23 LAB — T4, FREE: Free T4: 1.36 ng/dL (ref 0.60–1.60)

## 2015-07-23 LAB — TSH: TSH: 0.71 u[IU]/mL (ref 0.35–4.50)

## 2015-07-28 ENCOUNTER — Encounter: Payer: Self-pay | Admitting: Endocrinology

## 2015-07-28 ENCOUNTER — Ambulatory Visit (INDEPENDENT_AMBULATORY_CARE_PROVIDER_SITE_OTHER): Payer: 59 | Admitting: Endocrinology

## 2015-07-28 VITALS — BP 132/74 | HR 72 | Temp 98.0°F | Resp 14 | Ht 65.0 in | Wt 189.4 lb

## 2015-07-28 DIAGNOSIS — E890 Postprocedural hypothyroidism: Secondary | ICD-10-CM

## 2015-07-28 DIAGNOSIS — E559 Vitamin D deficiency, unspecified: Secondary | ICD-10-CM

## 2015-07-28 MED ORDER — LEVOTHYROXINE SODIUM 112 MCG PO TABS: 112.0000 ug | ORAL_TABLET | Freq: Every day | ORAL | Status: DC

## 2015-07-28 NOTE — Progress Notes (Signed)
Patient ID: Melissa Cox, female   DOB: 05-12-1950, 65 y.o.   MRN: [id number]   Reason for Appointment:  Hypothyroidism, followup visit    History of Present Illness:   The hypothyroidism was first diagnosed in 1977 after treatment of her Graves' disease with I-131  The patient has been treated with levothyroxine in varying doses, ranging from 100 up to 137 mcg The last visit was in 8/16 In 5/16 she had a high TSH level with using generic levothyroxine At that time she had been complaining of some fatigue and with increasing her dose to 112 g she started having a little better energy  She is now taking brand name Synthroid  No unusual fatigue currently, weight is stable           Not taking any calcium or iron supplements with the thyroid supplement which she takes at 2 am and has been quite compliant with this again   Lab Results  Component Value Date   TSH 0.71 07/23/2015   TSH 0.82 10/31/2014   TSH 6.84* 08/05/2014   FREET4 1.36 07/23/2015   FREET4 1.26 10/31/2014   FREET4 0.92 02/04/2014    Wt Readings from Last 3 Encounters:  07/28/15 189 lb 6.4 oz (85.911 kg)  07/21/15 188 lb (85.276 kg)  05/05/15 187 lb 8 oz (85.049 kg)       Medication List       This list is accurate as of: 07/28/15  9:45 AM.  Always use your most recent med list.               levothyroxine 112 MCG tablet  Commonly known as:  SYNTHROID, LEVOTHROID  Take 112 mcg by mouth daily before breakfast.     Vitamin D3 3000 units Tabs  Take 5,000 Units by mouth.        Allergies:  Allergies  Allergen Reactions  . Diphenhydramine Hcl     REACTION: abd cramps and diarrhea  . Meperidine Hcl     REACTION: rash  . Prochlorperazine Edisylate     REACTION: increased heart rate    Past Medical History  Diagnosis Date  . Unspecified essential hypertension 10/21/2008  . Thyrotoxicosis without mention of goiter or other cause, without mention of thyrotoxic crisis or storm      Past Surgical History  Procedure Laterality Date  . Abdominal hysterectomy  [id number]    partial, fibroids  . Breast lumpectomy      right,benign    No family history on file.  Social History:  reports that she has never smoked. She has never used smokeless tobacco. She reports that she drinks alcohol. She reports that she does not use illicit drugs.  REVIEW Of SYSTEMS:   Her baseline vitamin D level was 18 and has not been taking her supplement as directed, this was recommended at a 5000 unit dose on the last visit    Examination:   BP 132/74 mmHg  Pulse 72  Temp(Src) 98 F (36.7 C)  Resp 14  Ht 5\' 5"  (1.651 m)  Wt 189 lb 6.4 oz (85.911 kg)  BMI 31.52 kg/m2  SpO2 96%  GENERAL APPEARANCE:looks well No peripheral edema Thyroid not palpable    Assessment   Hypothyroidism, post ablative, long-standing. Her TSH is now consistently normal with taking 112 g of brand name Synthroid and she is symptomatically doing well She is compliant with her Synthroid before eating in the morning  She prefers the brand name supplement and  will continue this, 90 day supply sent  Vitamin D deficiency: She is not taking her supplement regularly and discussed that she will need to take this consistently long-term     Treatment:  She will continue brand name 112 g Synthroid  Follow-up in 6 months again with repeat labs  Vitamin D3 5000 units daily  Hillis Mcphatter 07/28/2015, 9:45 AM

## 2015-11-13 ENCOUNTER — Telehealth: Payer: Self-pay | Admitting: Family Medicine

## 2015-11-13 ENCOUNTER — Encounter: Payer: Self-pay | Admitting: Family Medicine

## 2015-11-13 ENCOUNTER — Ambulatory Visit (INDEPENDENT_AMBULATORY_CARE_PROVIDER_SITE_OTHER): Payer: 59 | Admitting: Family Medicine

## 2015-11-13 VITALS — BP 130/90 | HR 84 | Temp 98.8°F | Ht 65.0 in | Wt 183.5 lb

## 2015-11-13 DIAGNOSIS — R251 Tremor, unspecified: Secondary | ICD-10-CM | POA: Diagnosis not present

## 2015-11-13 DIAGNOSIS — R110 Nausea: Secondary | ICD-10-CM | POA: Diagnosis not present

## 2015-11-13 DIAGNOSIS — R42 Dizziness and giddiness: Secondary | ICD-10-CM

## 2015-11-13 LAB — CBC WITH DIFFERENTIAL/PLATELET
BASOS ABS: 0.1 10*3/uL (ref 0.0–0.1)
Basophils Relative: 0.5 % (ref 0.0–3.0)
EOS PCT: 0.7 % (ref 0.0–5.0)
Eosinophils Absolute: 0.1 10*3/uL (ref 0.0–0.7)
HEMATOCRIT: 41.7 % (ref 36.0–46.0)
HEMOGLOBIN: 14.2 g/dL (ref 12.0–15.0)
LYMPHS PCT: 24.6 % (ref 12.0–46.0)
Lymphs Abs: 3.1 10*3/uL (ref 0.7–4.0)
MCHC: 34.1 g/dL (ref 30.0–36.0)
MCV: 84.8 fl (ref 78.0–100.0)
MONOS PCT: 6.5 % (ref 3.0–12.0)
Monocytes Absolute: 0.8 10*3/uL (ref 0.1–1.0)
Neutro Abs: 8.4 10*3/uL — ABNORMAL HIGH (ref 1.4–7.7)
Neutrophils Relative %: 67.7 % (ref 43.0–77.0)
Platelets: 302 10*3/uL (ref 150.0–400.0)
RBC: 4.92 Mil/uL (ref 3.87–5.11)
RDW: 14.2 % (ref 11.5–15.5)
WBC: 12.4 10*3/uL — ABNORMAL HIGH (ref 4.0–10.5)

## 2015-11-13 LAB — COMPREHENSIVE METABOLIC PANEL
ALBUMIN: 4.6 g/dL (ref 3.5–5.2)
ALK PHOS: 70 U/L (ref 39–117)
ALT: 25 U/L (ref 0–35)
AST: 21 U/L (ref 0–37)
BILIRUBIN TOTAL: 0.5 mg/dL (ref 0.2–1.2)
BUN: 10 mg/dL (ref 6–23)
CALCIUM: 9.9 mg/dL (ref 8.4–10.5)
CO2: 28 mEq/L (ref 19–32)
Chloride: 104 mEq/L (ref 96–112)
Creatinine, Ser: 0.75 mg/dL (ref 0.40–1.20)
GFR: 82.27 mL/min (ref >60.00)
GLUCOSE: 91 mg/dL (ref 70–99)
Potassium: 3.7 mEq/L (ref 3.5–5.1)
Sodium: 141 mEq/L (ref 135–145)
TOTAL PROTEIN: 7.7 g/dL (ref 6.0–8.3)

## 2015-11-13 LAB — TSH: TSH: 0.74 u[IU]/mL (ref 0.35–4.50)

## 2015-11-13 NOTE — Patient Instructions (Signed)
Unclear what is causing your symptoms  Could be a summer viral illness but not clear. Tick borne illness possible but would suspect more severe disease including fever.   If you have new or worsening symptoms seek care immediately- otherwise I want you to call [place] [facility] on Monday for a follow up sometime next week  EKG reassuring. Get labs before you leave

## 2015-11-13 NOTE — Progress Notes (Addendum)
Subjective:  Melissa Cox is a 66 y.o. year old very pleasant female patient who presents for/with See problem oriented charting ROS- see any ROS included in HPI as well.   Past Medical History-  Patient Active Problem List   Diagnosis Date Noted  . Maxillary sinusitis 05/01/2015  . Rash and nonspecific skin eruption 07/09/2014  . 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 Foreign body in foot 11/17/2013  . Unspecified vitamin D deficiency 05/17/2013  . Other postablative hypothyroidism 05/17/2013  . Itching 03/25/2013  . Eustachian tube dysfunction 03/25/2013  . Acute paronychia of finger 11/07/2012  . Right arm cellulitis 08/26/2011    Medications- reviewed and updated Current Outpatient Prescriptions  Medication Sig Dispense Refill  . Cholecalciferol (VITAMIN D3) 3000 units TABS Take 5,000 Units by mouth.    . levothyroxine (SYNTHROID, LEVOTHROID) 112 MCG tablet Take 1 tablet (112 mcg total) by mouth daily before breakfast. 90 tablet 3   No current facility-administered medications for this visit.     Objective: BP 130/90   Pulse 84   Temp 98.8 F (37.1 C) (Oral)   Ht 5\' 5"  (1.651 m)   Wt 183 lb 8 oz (83.2 kg)   SpO2 98%   BMI 30.54 kg/m  Gen: NAD, resting comfortably, does not apper shaky during exam TM normal bilaterally, tongue somewhat dry CV: RRR no murmurs rubs or gallops Lungs: CTAB no crackles, wheeze, rhonchi Abdomen: soft/nontender/nondistended/normal bowel sounds. No rebound or guarding.  Ext: no edema Skin: warm, dry, no rash Neuro: grossly normal, moves all extremities  Assessment/Plan:  Nausea Shakiness Dizziness S: about 5 days ago after church got nauseous and persisted all day and into the night. She started feeling shaky that evening and felt bad into Monday morning. In the day she has felt better. Felt better yesterday and day before but last night felt bad again- woke up shaking around 1 30 AM and has not felt well since  then- lingering nausea, throat and tongue feel dry/kinda funny. No trouble breathing or swallowing. Mild dizziness/ off balance. At times feels like a heat wave going over her.   Does work in the garden a lot and had tick bite about 3 weeks ago. Wonders if she could have eaten some contaminated food.  ROS-No fever/chills/rash. No chest pain or palpitations or shortness of breath. No abdominal pain, constipation, dysuria, polyuria A/P: Patient with several nonspecific symptoms without clear etiology. 65 year old with primarily hypothyroidism- will check TSH. Also CBC, CMP. Checked EKG which was reassuring but would not completely rule out cardiac cause but nausea in her case would not be anginal equivalent. We discussed with unclear picture- importance of prompt follow up if new or worsening symptoms and otherwise following up with PCP next week if persists but does not worsen.  Only finding on exam was mild dehydration with dry tongue- advised increasing h20 intake. Doubt tick borne illness with bite over 3 weeks ago without fever in picture.   Orders Placed This Encounter  Procedures  . TSH  . CBC with Differential/Platelets  . CMP  . EKG 12-Lead    Order Specific Question:   Where should this test be performed    Answer:   Other   Garret Reddish, MD

## 2015-11-13 NOTE — Telephone Encounter (Signed)
Pt was seen at [facility] this am.

## 2015-11-13 NOTE — Telephone Encounter (Signed)
Patient Name: Melissa Cox  DOB: 1950/09/28    Initial Comment Caller states she has been having nausea, chills, fatigue on and off since Sunday   Nurse Assessment  Nurse: Mallie Mussel, RN, Alveta Heimlich Date/Time Eilene Ghazi Time): 11/13/2015 10:23:12 AM  Confirm and document reason for call. If symptomatic, describe symptoms. You must click the next button to save text entered. ---Caller states that she has had nausea, chills and fatigue on and off since Sunday. She has this now also. She has had some dizziness also. She thinks it was related to some home canned pickle beets she ate. Today, she has nausea, fatigue at present. Denies fever. Denies vomiting and diarrhea.  Has the patient traveled out of the country within the last 30 days? ---No  Does the patient have any new or worsening symptoms? ---Yes  Will a triage be completed? ---Yes  Related visit to physician within the last 2 weeks? ---No  Does the PT have any chronic conditions? (i.e. diabetes, asthma, etc.) ---Yes  List chronic conditions. ---Hypothyroidism  Is this a behavioral health or substance abuse call? ---No     Guidelines    Guideline Title Affirmed Question Affirmed Notes  Weakness (Generalized) and Fatigue [1] MODERATE weakness (i.e., interferes with work, school, normal activities) AND [2] cause unknown (Exceptions: weakness with acute minor illness, or weakness from poor fluid intake)    Final Disposition User   See Physician within 4 Hours (or PCP triage) Mallie Mussel, RN, Alveta Heimlich    Comments  No appointments at [facility] (checked for another patient about 10 minutes ago). She has been to [facility] before. Cory Nafziger does not have an appointment within the specified time frame. I scheduled her to be seen by Dr. Garret Reddish at 1:00pm today.   Referrals  REFERRED TO PCP OFFICE   Disagree/Comply: Comply

## 2016-01-12 ENCOUNTER — Other Ambulatory Visit: Payer: Self-pay | Admitting: Family Medicine

## 2016-01-12 DIAGNOSIS — Z1231 Encounter for screening mammogram for malignant neoplasm of breast: Secondary | ICD-10-CM

## 2016-01-25 ENCOUNTER — Other Ambulatory Visit (INDEPENDENT_AMBULATORY_CARE_PROVIDER_SITE_OTHER): Payer: 59

## 2016-01-25 DIAGNOSIS — E890 Postprocedural hypothyroidism: Secondary | ICD-10-CM

## 2016-01-25 LAB — T4, FREE: Free T4: 1.03 ng/dL (ref 0.60–1.60)

## 2016-01-25 LAB — TSH: TSH: 1.92 u[IU]/mL (ref 0.35–4.50)

## 2016-01-28 ENCOUNTER — Ambulatory Visit (INDEPENDENT_AMBULATORY_CARE_PROVIDER_SITE_OTHER): Payer: 59 | Admitting: Endocrinology

## 2016-01-28 ENCOUNTER — Encounter: Payer: Self-pay | Admitting: Endocrinology

## 2016-01-28 VITALS — BP 160/100 | HR 91 | Ht 65.0 in | Wt 188.0 lb

## 2016-01-28 DIAGNOSIS — E559 Vitamin D deficiency, unspecified: Secondary | ICD-10-CM | POA: Diagnosis not present

## 2016-01-28 DIAGNOSIS — E890 Postprocedural hypothyroidism: Secondary | ICD-10-CM

## 2016-01-28 DIAGNOSIS — Z23 Encounter for immunization: Secondary | ICD-10-CM | POA: Diagnosis not present

## 2016-01-28 NOTE — Patient Instructions (Signed)
Restart Vitamin D3 5000 units daily

## 2016-01-28 NOTE — Progress Notes (Signed)
Patient ID: Melissa Cox, female   DOB: 1951/01/18, 65 y.o.   MRN: [id number]   Reason for Appointment:  Hypothyroidism, followup visit    History of Present Illness:   The hypothyroidism was first diagnosed in 1977 after treatment of her Graves' disease with [id number]  The patient has been treated with levothyroxine in varying doses, ranging from 100 up to 137 mcg The last visit was in 8/16 In 5/16 she had a high TSH level with using generic levothyroxine At that time she had been complaining of some fatigue and with increasing her dose to 112 g she started having a little better energy  She is taking brand name Synthroid  No unusual fatigue currently, weight is stable           Not taking any calcium or iron supplements with the thyroid supplement which she takes at 2 am and has been quite compliant with this     Her TSH levels have been fairly consistent   Lab Results  Component Value Date   TSH 1.92 01/25/2016   TSH 0.74 11/13/2015   TSH 0.71 07/23/2015   FREET4 1.03 01/25/2016   FREET4 1.36 07/23/2015   FREET4 1.26 10/31/2014    Wt Readings from Last 3 Encounters:  01/28/16 188 lb (85.3 kg)  11/13/15 183 lb 8 oz (83.2 kg)  07/28/15 189 lb 6.4 oz (85.9 kg)       Medication List       Accurate as of 01/28/16  9:16 AM. Always use your most recent med list.          levothyroxine 112 MCG tablet Commonly known as:  SYNTHROID, LEVOTHROID Take 1 tablet (112 mcg total) by mouth daily before breakfast.   Vitamin D3 3000 units Tabs Take 5,000 Units by mouth.       Allergies:  Allergies  Allergen Reactions  . Diphenhydramine Hcl     REACTION: abd cramps and diarrhea  . Meperidine Hcl     REACTION: rash  . Prochlorperazine Edisylate     REACTION: increased heart rate    Past Medical History:  Diagnosis Date  . 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 Unspecified essential  hypertension 10/21/2008    Past Surgical History:  Procedure Laterality Date  . ABDOMINAL HYSTERECTOMY  [id number]   partial, fibroids  . BREAST LUMPECTOMY     right,benign    No family history on file.  Social History:  reports that she has never smoked. She has never used smokeless tobacco. She reports that she drinks alcohol. She reports that she does not use drugs.  REVIEW Of SYSTEMS:   Her baseline vitamin D level was 18 and has not been taking her supplement as directed,  She keeps forgetting to refill this at the drugstore    Examination:   BP (!) 160/100   Pulse 91   Ht 5\' 5"  (1.651 m)   Wt 188 lb (85.3 kg)   SpO2 96%   BMI 31.28 kg/m   No peripheral edema  skin appears normal    Assessment   Hypothyroidism, post ablative, long-standing. Her TSH is consistently normal with taking 112 g of brand name Synthroid and she is symptomatically doing well She is compliant with her Synthroid in the morning  She prefers the brand name supplement and will continue this  Vitamin D deficiency: She is not taking her supplement regularly and discussed that she  will need to  Take this consistently     Treatment:  She will continue brand name 112 g Synthroid  she may need to order this from Synthroid direct next year if her cost goes up -information given  Follow-up in 6 months again with repeat labs  Vitamin D3 5000 units daily  Emunah Texidor 01/28/2016, 9:16 AM

## 2016-02-02 ENCOUNTER — Ambulatory Visit
Admission: RE | Admit: 2016-02-02 | Discharge: 2016-02-02 | Disposition: A | Discharge status: Home / Self Care | Payer: 59 | Source: Ambulatory Visit | Attending: Family Medicine | Admitting: Family Medicine

## 2016-02-02 DIAGNOSIS — Z1231 Encounter for screening mammogram for malignant neoplasm of breast: Secondary | ICD-10-CM

## 2016-02-17 ENCOUNTER — Other Ambulatory Visit: Payer: Self-pay | Admitting: Family Medicine

## 2016-02-17 DIAGNOSIS — Z13220 Encounter for screening for lipoid disorders: Secondary | ICD-10-CM

## 2016-02-17 DIAGNOSIS — R5383 Other fatigue: Secondary | ICD-10-CM

## 2016-02-25 ENCOUNTER — Other Ambulatory Visit (INDEPENDENT_AMBULATORY_CARE_PROVIDER_SITE_OTHER): Payer: 59

## 2016-02-25 DIAGNOSIS — R5383 Other fatigue: Secondary | ICD-10-CM

## 2016-02-25 DIAGNOSIS — Z13220 Encounter for screening for lipoid disorders: Secondary | ICD-10-CM | POA: Diagnosis not present

## 2016-02-25 LAB — CBC WITH DIFFERENTIAL/PLATELET
BASOS PCT: 0.8 % (ref 0.0–3.0)
Basophils Absolute: 0.1 10*3/uL (ref 0.0–0.1)
EOS ABS: 0.4 10*3/uL (ref 0.0–0.7)
Eosinophils Relative: 3.6 % (ref 0.0–5.0)
HCT: 42.6 % (ref 36.0–46.0)
HEMOGLOBIN: 14.5 g/dL (ref 12.0–15.0)
Lymphocytes Relative: 31.4 % (ref 12.0–46.0)
Lymphs Abs: 3.2 10*3/uL (ref 0.7–4.0)
MCHC: 34 g/dL (ref 30.0–36.0)
MCV: 84.6 fl (ref 78.0–100.0)
Monocytes Absolute: 0.8 10*3/uL (ref 0.1–1.0)
Monocytes Relative: 7.4 % (ref 3.0–12.0)
Neutro Abs: 5.8 10*3/uL (ref 1.4–7.7)
Neutrophils Relative %: 56.8 % (ref 43.0–77.0)
Platelets: 323 10*3/uL (ref 150.0–400.0)
RBC: 5.04 Mil/uL (ref 3.87–5.11)
RDW: 14.1 % (ref 11.5–15.5)
WBC: 10.3 10*3/uL (ref 4.0–10.5)

## 2016-02-25 LAB — LIPID PANEL
CHOL/HDL RATIO: 5
CHOLESTEROL: 240 mg/dL — AB (ref 0–200)
HDL: 50.9 mg/dL (ref >39.00)
LDL CALC: 160 mg/dL — AB (ref 0–99)
NONHDL: 189.52
Triglycerides: 147 mg/dL (ref 0.0–149.0)
VLDL: 29.4 mg/dL (ref 0.0–40.0)

## 2016-02-25 LAB — BASIC METABOLIC PANEL
BUN: 18 mg/dL (ref 6–23)
CO2: 29 mEq/L (ref 19–32)
Calcium: 9.7 mg/dL (ref 8.4–10.5)
Chloride: 105 mEq/L (ref 96–112)
Creatinine, Ser: 0.87 mg/dL (ref 0.40–1.20)
GFR: 69.26 mL/min (ref >60.00)
GLUCOSE: 104 mg/dL — AB (ref 70–99)
POTASSIUM: 4.1 meq/L (ref 3.5–5.1)
SODIUM: 141 meq/L (ref 135–145)

## 2016-02-25 LAB — HEPATIC FUNCTION PANEL
ALBUMIN: 4.2 g/dL (ref 3.5–5.2)
ALK PHOS: 73 U/L (ref 39–117)
ALT: 23 U/L (ref 0–35)
AST: 17 U/L (ref 0–37)
BILIRUBIN TOTAL: 0.5 mg/dL (ref 0.2–1.2)
Bilirubin, Direct: 0 mg/dL (ref 0.0–0.3)
Total Protein: 7.4 g/dL (ref 6.0–8.3)

## 2016-03-03 ENCOUNTER — Encounter: Payer: Self-pay | Admitting: Family Medicine

## 2016-03-03 ENCOUNTER — Ambulatory Visit (INDEPENDENT_AMBULATORY_CARE_PROVIDER_SITE_OTHER): Payer: 59 | Admitting: Family Medicine

## 2016-03-03 ENCOUNTER — Encounter: Payer: 59 | Admitting: Family Medicine

## 2016-03-03 VITALS — BP 136/84 | HR 84 | Temp 98.2°F | Ht 65.0 in | Wt 189.5 lb

## 2016-03-03 DIAGNOSIS — Z23 Encounter for immunization: Secondary | ICD-10-CM

## 2016-03-03 DIAGNOSIS — Z0000 Encounter for general adult medical examination without abnormal findings: Secondary | ICD-10-CM | POA: Diagnosis not present

## 2016-03-03 DIAGNOSIS — Z1211 Encounter for screening for malignant neoplasm of colon: Secondary | ICD-10-CM | POA: Diagnosis not present

## 2016-03-03 NOTE — Progress Notes (Signed)
Subjective:    Patient ID: Melissa Cox, female    DOB: 1950/06/11, 65 y.o.   MRN: [id number]  HPI This is a 65 yo female who presents today for CPE.   Last CPE- several years Mammo- 02/02/16 Pap- hysterectomy, saw gyn recently and was diagnosed with lichen sclerosis Colonoscopy- never, agreeable to Cologuard Td- 01/2006, will update today Flu- annual Eye- has exam scheduled Dental- every 6 months Exercise- housework, gardening Dermatologist- annual   Past Medical History:  Diagnosis Date  . 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 Unspecified essential hypertension 10/21/2008   Past Surgical History:  Procedure Laterality Date  . ABDOMINAL HYSTERECTOMY  [id number]   partial, fibroids  . BREAST LUMPECTOMY     right,benign   No family history on file. Social History  Substance Use Topics  . Smoking status: Never Smoker  . Smokeless tobacco: Never Used  . Alcohol use Yes     Comment: occ glass of wine       Review of Systems  Constitutional: Negative for appetite change and fever.  HENT: Negative.   Eyes: Negative.   Respiratory: Negative.   Cardiovascular: Negative.   Gastrointestinal: Positive for abdominal pain (acid reflux, relieved with drinking water and belching). Negative for blood in stool and diarrhea. Constipation: rare.  Endocrine: Positive for heat intolerance (with age).  Genitourinary: Positive for urgency. Negative for dysuria.  Musculoskeletal: Positive for arthralgias (hands, occasional back pain ).  Skin: Negative.   Allergic/Immunologic: Negative.   Neurological: Positive for light-headedness (occasional, lasting a few seconds) and headaches (rare).  Hematological: Bruises/bleeds easily (easier than she used to).  Psychiatric/Behavioral: Negative for dysphoric mood. The patient is not nervous/anxious.        Objective:   Physical Exam Physical Exam  Constitutional: She  is oriented to person, place, and time. She appears well-developed and well-nourished. No distress.  HENT:  Head: Normocephalic and atraumatic.  Right Ear: External ear normal.  Left Ear: External ear normal.  Nose: Nose normal.  Mouth/Throat: Oropharynx is clear and moist. No oropharyngeal exudate.  Eyes: Conjunctivae are normal. Pupils are equal, round, and reactive to light.  Neck: Normal range of motion. Neck supple. No JVD present. No thyromegaly present.  Cardiovascular: Normal rate, regular rhythm, normal heart sounds and intact distal pulses.   Pulmonary/Chest: Effort normal and breath sounds normal.   Abdominal: Soft. Bowel sounds are normal. She exhibits no distension and no mass. There is no tenderness. There is no rebound and no guarding.  Musculoskeletal: Normal range of motion. She exhibits no edema or tenderness.  Lymphadenopathy:    She has no cervical adenopathy.  Neurological: She is alert and oriented to person, place, and time. She has normal reflexes.  Skin: Skin is warm and dry. She is not diaphoretic.  Psychiatric: She has a normal mood and affect. Her behavior is normal. Judgment and thought content normal.  Vitals reviewed.  BP 136/84   Pulse 84   Temp 98.2 F (36.8 C) (Oral)   Ht 5\' 5"  (1.651 m)   Wt 189 lb 8 oz (86 kg)   BMI 31.53 kg/m  Wt Readings from Last 3 Encounters:  03/03/16 189 lb 8 oz (86 kg)  01/28/16 188 lb (85.3 kg)  11/13/15 183 lb 8 oz (83.2 kg)   Had labs drawn 02/25/16    Assessment & Plan:  1. Annual physical exam - had labs drawn 02/25/16, reviewed results -  Discussed and encouraged healthy lifestyle choices- adequate sleep, regular exercise, stress management and healthy food choices.  - follow up in 1 year  2. Need for Tdap vaccination - Tdap vaccine greater than or equal to 7yo IM  3. Screening for colon cancer - Cologuard   Clarene Reamer, FNP-BC  [facility] [place]  [facility]  03/05/2016 9:17 PM

## 2016-03-03 NOTE — Progress Notes (Signed)
Pre visit review using our clinic review tool, if applicable. No additional management support is needed unless otherwise documented below in the visit note. 

## 2016-03-03 NOTE — Patient Instructions (Addendum)
Please stop at the front and schedule your Prevnar 13- injection visit only   Keeping You Healthy  Get These Tests  Blood Pressure- Have your blood pressure checked by your healthcare provider at least once a year.  Normal blood pressure is 120/80.  Weight- Have your body mass index (BMI) calculated to screen for obesity.  BMI is a measure of body fat based on height and weight.  You can calculate your own BMI at [employer]velB[employer].it  Cholesterol- Have your cholesterol checked every year.  Diabetes- Have your blood sugar checked every year if you have high blood pressure, high cholesterol, a family history of diabetes or if you are overweight.  Pap Test - Have a pap test every 1 to 5 years if you have been sexually active.  If you are older than 65 and recent pap tests have been normal you may not need additional pap tests.  In addition, if you have had a hysterectomy  for benign disease additional pap tests are not necessary.  Mammogram-Yearly mammograms are essential for early detection of breast cancer  Screening for Colon Cancer- Colonoscopy starting at age 57. Screening may begin sooner depending on your family history and other health conditions.  Follow up colonoscopy as directed by your Gastroenterologist.  Screening for Osteoporosis- Screening begins at age 43 with bone density scanning, sooner if you are at higher risk for developing Osteoporosis.  Get these medicines  Calcium with Vitamin D- Your body requires 1200-1500 mg of Calcium a day and [id number] IU of Vitamin D a day.  You can only absorb 500 mg of Calcium at a time therefore Calcium must be taken in 2 or 3 separate doses throughout the day.  Hormones- Hormone therapy has been associated with increased risk for certain cancers and heart disease.  Talk to your healthcare provider about if you need relief from menopausal symptoms.  Aspirin- Ask your healthcare provider about taking Aspirin to prevent Heart Disease  and Stroke.  Get these Immuniztions  Flu shot- Every fall  Pneumonia shot- Once after the age of 74; if you are younger ask your healthcare provider if you need a pneumonia shot.  Tetanus- Every ten years.  Zostavax- Once after the age of 31 to prevent shingles.  Take these steps  Don't smoke- Your healthcare provider can help you quit. For tips on how to quit, ask your healthcare provider or go to www.smokefree.gov or call 1-800 QUIT-NOW.  Be physically active- Exercise 5 days a week for a minimum of 30 minutes.  If you are not already physically active, start slow and gradually work up to 30 minutes of moderate physical activity.  Try walking, dancing, bike riding, swimming, etc.  Eat a healthy diet- Eat a variety of healthy foods such as fruits, vegetables, whole grains, low fat milk, low fat cheeses, yogurt, lean meats, chicken, fish, eggs, dried beans, tofu, etc.  For more information go to www.thenutritionsource.org  Dental visit- Brush and floss teeth twice daily; visit your dentist twice a year.  Eye exam- Visit your Optometrist or Ophthalmologist yearly.  Drink alcohol in moderation- Limit alcohol intake to one drink or less a day.  Never drink and drive.  Depression- Your emotional health is as important as your physical health.  If you're feeling down or losing interest in things you normally enjoy, please talk to your healthcare provider.  Seat Belts- can save your life; always wear one  Smoke/Carbon Monoxide detectors- These detectors need to be installed on the appropriate  level of your home.  Replace batteries at least once a year.  Violence- If anyone is threatening or hurting you, please tell your healthcare provider.  Living Will/ Health care power of attorney- Discuss with your healthcare provider and family.  Mediterranean Diet A Mediterranean diet refers to food and lifestyle choices that are based on the traditions of countries located on the [place]. This way of eating has been shown to help prevent certain conditions and improve outcomes for people who have chronic diseases, like kidney disease and heart disease. What are tips for following this plan? Lifestyle  Cook and eat meals together with your family, when possible.  Drink enough fluid to keep your urine clear or pale yellow.  Be physically active every day. This includes:  Aerobic exercise like running or swimming.  Leisure activities like gardening, walking, or housework.  Get 7-8 hours of sleep each night.  If recommended by your health care provider, drink red wine in moderation. This means 1 glass a day for nonpregnant women and 2 glasses a day for men. A glass of wine equals 5 oz (150 mL). Reading food labels  Check the serving size of packaged foods. For foods such as rice and pasta, the serving size refers to the amount of cooked product, not dry.  Check the total fat in packaged foods. Avoid foods that have saturated fat or trans fats.  Check the ingredients list for added sugars, such as corn syrup. Shopping  At the grocery store, buy most of your food from the areas near the walls of the store. This includes:  Fresh fruits and vegetables (produce).  Grains, beans, nuts, and seeds. Some of these may be available in unpackaged forms or large amounts (in bulk).  Fresh seafood.  Poultry and eggs.  Low-fat dairy products.  Buy whole ingredients instead of prepackaged foods.  Buy fresh fruits and vegetables in-season from local farmers markets.  Buy frozen fruits and vegetables in resealable bags.  If you do not have access to quality fresh seafood, buy precooked frozen shrimp or canned fish, such as tuna, salmon, or sardines.  Buy small amounts of raw or cooked vegetables, salads, or olives from the deli or salad bar at your store.  Stock your pantry so you always have certain foods on hand, such as olive oil, canned tuna, canned tomatoes, rice,  pasta, and beans. Cooking  Cook foods with extra-virgin olive oil instead of using butter or other vegetable oils.  Have meat as a side dish, and have vegetables or grains as your main dish. This means having meat in small portions or adding small amounts of meat to foods like pasta or stew.  Use beans or vegetables instead of meat in common dishes like chili or lasagna.  Experiment with different cooking methods. Try roasting or broiling vegetables instead of steaming or sauteing them.  Add frozen vegetables to soups, stews, pasta, or rice.  Add nuts or seeds for added healthy fat at each meal. You can add these to yogurt, salads, or vegetable dishes.  Marinate fish or vegetables using olive oil, lemon juice, garlic, and fresh herbs. Meal planning  Plan to eat 1 vegetarian meal one day each week. Try to work up to 2 vegetarian meals, if possible.  Eat seafood 2 or more times a week.  Have healthy snacks readily available, such as:  Vegetable sticks with hummus.  Greek yogurt.  Fruit and nut trail mix.  Eat balanced meals throughout the week.  This includes:  Fruit: 2-3 servings a day  Vegetables: 4-5 servings a day  Low-fat dairy: 2 servings a day  Fish, poultry, or lean meat: 1 serving a day  Beans and legumes: 2 or more servings a week  Nuts and seeds: 1-2 servings a day  Whole grains: 6-8 servings a day  Extra-virgin olive oil: 3-4 servings a day  Limit red meat and sweets to only a few servings a month What are my food choices?  Mediterranean diet  Recommended  Grains: Whole-grain pasta. Brown rice. Bulgar wheat. Polenta. Couscous. Whole-wheat bread. [place].  Vegetables: Artichokes. Beets. Broccoli. Cabbage. Carrots. Eggplant. Green beans. Chard. Kale. Spinach. Onions. Leeks. Peas. Squash. Tomatoes. Peppers. Radishes.  Fruits: Apples. Apricots. Avocado. Berries. Bananas. Cherries. Dates. Figs. Grapes. Lemons. Melon. Oranges. Peaches. Plums.  Pomegranate.  Meats and other protein foods: Beans. Almonds. Sunflower seeds. Pine nuts. Peanuts. [employer]. Salmon. Scallops. Shrimp. [place]. Tilapia. Clams. Oysters. Eggs.  Dairy: Low-fat milk. Cheese. Greek yogurt.  Beverages: Water. Red wine. Herbal tea.  Fats and oils: Extra virgin olive oil. Avocado oil. Grape seed oil.  Sweets and desserts: [place] yogurt with honey. Baked apples. Poached pears. Trail mix.  Seasoning and other foods: Basil. Cilantro. Coriander. Cumin. Mint. Parsley. Sage. Rosemary. Tarragon. Garlic. Oregano. Thyme. Pepper. Balsalmic vinegar. Tahini. Hummus. Tomato sauce. Olives. Mushrooms.  Limit these  Grains: Prepackaged pasta or rice dishes. Prepackaged cereal with added sugar.  Vegetables: Deep fried potatoes (french fries).  Fruits: Fruit canned in syrup.  Meats and other protein foods: Beef. Pork. Lamb. Poultry with skin. Hot dogs. Berniece Salines.  Dairy: Ice cream. Sour cream. Whole milk.  Beverages: Juice. Sugar-sweetened soft drinks. Beer. Liquor and spirits.  Fats and oils: Butter. Canola oil. Vegetable oil. Beef fat (tallow). Lard.  Sweets and desserts: Cookies. Cakes. Pies. Candy.  Seasoning and other foods: Mayonnaise. Premade sauces and marinades.  The items listed may not be a complete list. Talk with your dietitian about what dietary choices are right for you. Summary  The Mediterranean diet includes both food and lifestyle choices.  Eat a variety of fresh fruits and vegetables, beans, nuts, seeds, and whole grains.  Limit the amount of red meat and sweets that you eat.  Talk with your health care provider about whether it is safe for you to drink red wine in moderation. This means 1 glass a day for nonpregnant women and 2 glasses a day for men. A glass of wine equals 5 oz (150 mL). This information is not intended to replace advice given to you by your health care provider. Make sure you discuss any questions you have with your health care  provider. Document Released: 11/05/2015 Document Revised: 12/08/2015 Document Reviewed: 11/05/2015 Elsevier Interactive Patient Education  2017 [employer].

## 2016-03-28 DIAGNOSIS — K5792 Diverticulitis of intestine, part unspecified, without perforation or abscess without bleeding: Secondary | ICD-10-CM

## 2016-03-28 HISTORY — DX: Diverticulitis of intestine, part unspecified, without perforation or abscess without bleeding: K57.92

## 2016-04-06 ENCOUNTER — Encounter: Payer: Self-pay | Admitting: Family Medicine

## 2016-04-06 ENCOUNTER — Ambulatory Visit (INDEPENDENT_AMBULATORY_CARE_PROVIDER_SITE_OTHER): Payer: PPO | Admitting: Family Medicine

## 2016-04-06 VITALS — BP 160/100 | HR 74 | Temp 98.4°F | Ht 65.0 in | Wt 192.0 lb

## 2016-04-06 DIAGNOSIS — J0100 Acute maxillary sinusitis, unspecified: Secondary | ICD-10-CM

## 2016-04-06 MED ORDER — AMOXICILLIN-POT CLAVULANATE 875-125 MG PO TABS: 1.0000 | ORAL_TABLET | Freq: Two times a day (BID) | ORAL | 0 refills | Status: AC

## 2016-04-06 NOTE — Progress Notes (Signed)
Pre visit review using our clinic review tool, if applicable. No additional management support is needed unless otherwise documented below in the visit note. 

## 2016-04-06 NOTE — Progress Notes (Signed)
Dr. Frederico Hamman T. Delvis Kau, MD, [facility] [place], [address] Phone: [phone] Fax: [phone]  04/06/2016  Patient: Melissa Cox, MRN: [id number], DOB: September 13, 1950, 66 y.o.  Primary Physician:  Owens Loffler, MD   Chief Complaint  Patient presents with  . Headache  . Facail Pain  . Neck Pain  . Sinus Drainage   Subjective:   This 66 y.o. female patient presents with runny nose, sneezing, cough, sore throat, malaise and minimal / low-grade fever for > 3 week. Now the primary complaint has become sinus pressure and pain behind the eyes and in the upper, anterior face.  Sick after christmas with URI and the last 3 days it has been getting worse. Ear is also full and pain down into her neck.   The patent denies sore throat as the primary complaint. Denies sthortness of breath/wheezing, high fever, chest pain, significant myalgia, otalgia, abdominal pain, changes in bowel or bladder.  PMH, PHS, Allergies, Problem List, Medications, Family History, and Social History have all been reviewed.  Patient Active Problem List   Diagnosis Date Noted  . 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 Unspecified vitamin D deficiency 05/17/2013    Past Medical History:  Diagnosis Date  . 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 Unspecified essential hypertension 10/21/2008    Past Surgical History:  Procedure Laterality Date  . ABDOMINAL HYSTERECTOMY  [id number]   partial, fibroids  . BREAST LUMPECTOMY     right,benign    Social History   Social History  . Marital status: Married    Spouse name: N/A  . Number of children: N/A  . Years of education: N/A   Occupational History  . Not on file.   Social History Main Topics  . Smoking status: Never Smoker  . Smokeless tobacco: Never Used  . Alcohol use Yes     Comment: occ glass of  wine  . Drug use: No  . Sexual activity: Not on file   Other Topics Concern  . Not on file   Social History Narrative  . No narrative on file    No family history on file.  Allergies  Allergen Reactions  . Diphenhydramine Hcl     REACTION: abd cramps and diarrhea  . Meperidine Hcl     REACTION: rash  . Prochlorperazine Edisylate     REACTION: increased heart rate    Medication list reviewed and updated in full in [place].  ROS as above, eating and drinking - tolerating PO. Urinating normally. No excessive vomitting or diarrhea. O/w as above.  Objective:   Blood pressure (!) 160/100, pulse 74, temperature 98.4 F (36.9 C), temperature source Oral, height 5\' 5"  (1.651 m), weight 192 lb (87.1 kg).  GEN: WDWN, Non-toxic, Atraumatic, normocephalic. A and O x 3. HEENT: Oropharynx clear without exudate, MMM, no significant LAD, mild rhinnorhea Sinuses: Right Frontal, ethmoid, and maxillary: Tender max Left Frontal, Ethmoid, and maxillary: Tender mildly at the max Ears: TM clear, COL visualized with good landmarks CV: RRR, no m/g/r. Pulm: CTA B, no wheezes, rhonchi, or crackles, normal respiratory effort. EXT: no c/c/e Psych: well oriented, neither depressed nor anxious in appearance  Assessment and Plan:   Acute non-recurrent maxillary sinusitis  Acute sinusitis: ABX as below.   Reviewed symptomatic care as well as ABX in this case.   Follow-up: No Follow-up on file.  New Prescriptions   AMOXICILLIN-CLAVULANATE (AUGMENTIN) 875-125 MG TABLET    Take 1 tablet by mouth 2 (two) times daily.   Signed,  Maud Deed. Lynore Coscia, MD  Patient's Medications  New Prescriptions   AMOXICILLIN-CLAVULANATE (AUGMENTIN) 875-125 MG TABLET    Take 1 tablet by mouth 2 (two) times daily.  Previous Medications   CHOLECALCIFEROL (VITAMIN D3) 3000 UNITS TABS    Take 5,000 Units by mouth.   CLOBETASOL CREAM (TEMOVATE) 0.05 %    Apply 1 application topically 2 (two) times daily.     LEVOTHYROXINE (SYNTHROID, LEVOTHROID) 112 MCG TABLET    Take 1 tablet (112 mcg total) by mouth daily before breakfast.  Modified Medications   No medications on file  Discontinued Medications   No medications on file

## 2016-04-21 ENCOUNTER — Encounter: Payer: Self-pay | Admitting: Family Medicine

## 2016-04-21 ENCOUNTER — Ambulatory Visit (INDEPENDENT_AMBULATORY_CARE_PROVIDER_SITE_OTHER): Payer: PPO | Admitting: Family Medicine

## 2016-04-21 DIAGNOSIS — J069 Acute upper respiratory infection, unspecified: Secondary | ICD-10-CM | POA: Diagnosis not present

## 2016-04-21 MED ORDER — AMOXICILLIN-POT CLAVULANATE 875-125 MG PO TABS: 1.0000 | ORAL_TABLET | Freq: Two times a day (BID) | ORAL | 0 refills | Status: DC

## 2016-04-21 NOTE — Patient Instructions (Signed)
Rest and fluids in the meantime.  If worse tomorrow, then restart augmentin.  Take care.  Glad to see you.

## 2016-04-21 NOTE — Progress Notes (Signed)
Sx started after xmas 2017.  Husband was initially sick.  She got some better but then had more facial and ear pain.  Seen by Dr. Loletha Grayer at that point, started on abx with some improvement, but now with return of nausea and ear pain.  "I felt miserable."  Less ear pain today but HA noted.  Post nasal gtt.  No fevers.  She has chills.  No vomiting, but sig nausea prev noted.  No diarrhea.    Some cough, but not a lot.  She has some throat clearing, esp at night.    Meds, vitals, and allergies reviewed.   ROS: Per HPI unless specifically indicated in ROS section   GEN: nad, alert and oriented HEENT: mucous membranes moist, tm w/o erythema, nasal exam w/o erythema, clear discharge noted,  OP with cobblestoning,Sinuses not ttp  NECK: supple w/o LA CV: rrr.   PULM: ctab, no inc wob EXT: no edema

## 2016-04-21 NOTE — Progress Notes (Signed)
Pre visit review using our clinic review tool, if applicable. No additional management support is needed unless otherwise documented below in the visit note. 

## 2016-04-22 NOTE — Assessment & Plan Note (Signed)
Okay for outpatient f/u.  Rest and fluids in the meantime.  She is some better at time of OV.  If worse tomorrow, then restart augmentin.  She agrees.   Possibly incompletely treated or relapsing sinsusitis, d/w pt.  She agrees.

## 2016-05-11 DIAGNOSIS — Z1211 Encounter for screening for malignant neoplasm of colon: Secondary | ICD-10-CM | POA: Diagnosis not present

## 2016-05-11 LAB — COLOGUARD: Cologuard: NEGATIVE

## 2016-05-12 LAB — COLOGUARD: Cologuard: NEGATIVE

## 2016-05-18 ENCOUNTER — Encounter: Payer: Self-pay | Admitting: Family Medicine

## 2016-05-26 ENCOUNTER — Ambulatory Visit: Payer: 59

## 2016-06-09 ENCOUNTER — Encounter (INDEPENDENT_AMBULATORY_CARE_PROVIDER_SITE_OTHER): Payer: Self-pay

## 2016-06-09 ENCOUNTER — Ambulatory Visit (INDEPENDENT_AMBULATORY_CARE_PROVIDER_SITE_OTHER): Payer: PPO | Admitting: *Deleted

## 2016-06-09 DIAGNOSIS — Z23 Encounter for immunization: Secondary | ICD-10-CM

## 2016-07-15 ENCOUNTER — Encounter: Payer: Self-pay | Admitting: Family Medicine

## 2016-07-15 ENCOUNTER — Ambulatory Visit (INDEPENDENT_AMBULATORY_CARE_PROVIDER_SITE_OTHER): Payer: PPO | Admitting: Family Medicine

## 2016-07-15 VITALS — BP 142/90 | HR 83 | Temp 98.5°F | Ht 65.0 in | Wt 187.5 lb

## 2016-07-15 DIAGNOSIS — S30861A Insect bite (nonvenomous) of abdominal wall, initial encounter: Secondary | ICD-10-CM

## 2016-07-15 DIAGNOSIS — W57XXXA Bitten or stung by nonvenomous insect and other nonvenomous arthropods, initial encounter: Secondary | ICD-10-CM

## 2016-07-15 NOTE — Progress Notes (Signed)
Pre visit review using our clinic review tool, if applicable. No additional management support is needed unless otherwise documented below in the visit note. 

## 2016-07-15 NOTE — Progress Notes (Signed)
   Subjective:    Patient ID: Melissa Cox, female    DOB: 28-Dec-1950, 66 y.o.   MRN: [id number]  HPI    66 year old female patient presents following tick bite.  found tick yesterday when getting in shower.  Husband removed the tick but was unable to remove  all of it/mouthparts.  She has been applying antibitoics and bandaid.   She has a tick. There is area of surrounding, erythema, no pain, very itchy, and no discharge.  No fever, feels well overall.    Review of Systems  Constitutional: Negative for fatigue and fever.  HENT: Negative for ear pain.   Eyes: Negative for pain.  Respiratory: Negative for chest tightness and shortness of breath.   Cardiovascular: Negative for chest pain, palpitations and leg swelling.  Gastrointestinal: Negative for abdominal pain.  Genitourinary: Negative for dysuria.       Objective:   Physical Exam  Constitutional: She is oriented to person, place, and time. She appears well-developed.  HENT:  Head: Normocephalic.  Right Ear: External ear normal.  Left Ear: External ear normal.  Cardiovascular: Normal rate.   No murmur heard. Pulmonary/Chest: Effort normal and breath sounds normal.  Neurological: She is alert and oriented to person, place, and time.  Skin: Rash noted.  Left upper abdomen with shallow ulcer where trauma to skin occurred with attempted removal of tick. NO mouthparts of tick noted. 3 cm surrounding erythema with no increased warmth, no fluctuance noted.          Assessment & Plan:

## 2016-07-15 NOTE — Patient Instructions (Addendum)
Keep area clean and dry.  Do not try to remove mouthparts.  Apply topical steroid cream twice daily for inflammation.  Call if redness spreading.  In 2 weeks, call if new onset headaches, neck stiffness, fever , joint pain, new rash.  Tick Bite Information, Adult Ticks are insects that draw blood for food. Most ticks live in shrubs and grassy areas. They climb onto people and animals that brush against the leaves and grasses that they rest on. Then they bite, attaching themselves to the skin. Most ticks are harmless, but some ticks carry germs that can spread to a person through a bite and cause a disease. To reduce your risk of getting a disease from a tick bite, it is important to take steps to prevent tick bites. It is also important to check for ticks after being outdoors. If you find that a tick has attached to you, watch for symptoms of disease. How can I prevent tick bites? Take these steps to help prevent tick bites when you are outdoors in an area where ticks are found:  Use insect repellent that has DEET (20% or higher), picaridin, or IR3535 in it. Use it on:  Skin that is showing.  The top of your boots.  Your pant legs.  Your sleeve cuffs.  For repellent products that contain permethrin, follow product instructions. Use these products on:  Clothing.  Gear.  Boots.  Tents.  Wear protective clothing. Long sleeves and long pants offer the best protection from ticks.  Wear light-colored clothing so you can see ticks more easily.  Tuck your pant legs into your socks.  If you go walking on a trail, stay in the middle of the trail so your skin, hair, and clothing do not touch the bushes.  Avoid walking through areas with long grass.  Check for ticks on your clothing, hair, and skin often while you are outside, and check again before you go inside. Make sure to check the places that ticks attach themselves most often. These places include the scalp, neck, armpits,  waist, groin, and joint areas. Ticks that carry a disease called Lyme disease have to be attached to the skin for 24-48 hours. Checking for ticks every day will lessen your risk of this and other diseases.  When you come indoors, wash your clothes and take a shower or a bath right away. Dry your clothes in a dryer on high heat for at least 60 minutes. This will kill any ticks in your clothes. What is the proper way to remove a tick? If you find a tick on your body, remove it as soon as possible. Removing a tick sooner rather than later can prevent germs from passing from the tick to your body. To remove a tick that is crawling on your skin but has not bitten:  Go outdoors and brush the tick off.  Remove the tick with tape or a lint roller. To remove a tick that is attached to your skin:  Wash your hands.  If you have latex gloves, put them on.  Use tweezers, curved forceps, or a tick-removal tool to gently grasp the tick as close to your skin and the tick's head as possible.  Gently pull with steady, upward pressure until the tick lets go. When removing the tick:  Take care to keep the tick's head attached to its body.  Do not twist or jerk the tick. This can make the tick's head or mouth break off.  Do not squeeze  or crush the tick's body. This could force disease-carrying fluids from the tick into your body.  ? If sections of the mouthparts of the tick remain in the skin, they should be left alone as they will normally be expelled spontaneously.  Do not try to remove a tick with heat, alcohol, petroleum jelly, or fingernail polish. Using these methods can cause the tick to salivate and regurgitate into your bloodstream, increasing your risk of getting a disease. What should I do after removing a tick?  Clean the bite area with soap and water, rubbing alcohol, or an iodine scrub.  If an antiseptic cream or ointment is available, apply a small amount to the bite site.  Wash and  disinfect any instruments that you used to remove the tick. How should I dispose of a tick? To dispose of a live tick, use one of these methods:  Place it in rubbing alcohol.  Place it in a sealed bag or container.  Wrap it tightly in tape.  Flush it down the toilet. Contact a health care provider if:  You have symptoms of a disease after a tick bite. Symptoms of a tick-borne disease can occur from moments after the tick bites to up to 30 days after a tick is removed. Symptoms include:  Muscle, joint, or bone pain.  Difficulty walking or moving your legs.  Numbness in the legs.  Paralysis.  Red rash around the tick bite area that is shaped like a target or a "bull's-eye."  Redness and swelling in the area of the tick bite.  Fever.  Repeated vomiting.  Diarrhea.  Weight loss.  Tender, swollen lymph glands.  Shortness of breath.  Cough.  Pain in the abdomen.  Headache.  Abnormal tiredness.  A change in your level of consciousness.  Confusion. Summary  Ticks may carry germs that can spread to a person through a bite and cause disease.  Wear protective clothing and use insect repellent to prevent tick bites. Follow product instructions.  If you find a tick on your body, remove it as soon as possible. If the tick is attached, do not try to remove with heat, alcohol, petroleum jelly, or fingernail polish.  Remove the attached tick using tweezers, curved forceps, or a tick-removal tool. Gently pull with steady, upward pressure until the tick lets go. Do not twist or jerk the tick. Do not squeeze or crush the tick's body.  If you have symptoms after being bitten by a tick, contact a health care provider. This information is not intended to replace advice given to you by your health care provider. Make sure you discuss any questions you have with your health care provider. Document Released: 03/11/2000 Document Revised: 12/25/2015 Document Reviewed:  12/25/2015 Elsevier Interactive Patient Education  2017 [employer].

## 2016-07-18 DIAGNOSIS — W57XXXA Bitten or stung by nonvenomous insect and other nonvenomous arthropods, initial encounter: Secondary | ICD-10-CM

## 2016-07-18 DIAGNOSIS — S30861A Insect bite (nonvenomous) of abdominal wall, initial encounter: Secondary | ICD-10-CM | POA: Insufficient documentation

## 2016-07-18 NOTE — Assessment & Plan Note (Signed)
Local allergic reaction.  Pt reassured and encouraged to not attempt removal of possible remaining tick parts ( none seen).  No clear sign of infection.  Will have pt apply topical steroid for itching.

## 2016-07-25 ENCOUNTER — Other Ambulatory Visit (INDEPENDENT_AMBULATORY_CARE_PROVIDER_SITE_OTHER): Payer: PPO

## 2016-07-25 DIAGNOSIS — E890 Postprocedural hypothyroidism: Secondary | ICD-10-CM

## 2016-07-25 LAB — T4, FREE: Free T4: 0.98 ng/dL (ref 0.60–1.60)

## 2016-07-25 LAB — TSH: TSH: 3.16 u[IU]/mL (ref 0.35–4.50)

## 2016-07-27 NOTE — Progress Notes (Signed)
Patient ID: Melissa Cox, female   DOB: [birth year], 66 y.o.   MRN: [id number]   Reason for Appointment:  Hypothyroidism, followup visit    History of Present Illness:   The hypothyroidism was first diagnosed in 1977 after treatment of her Graves' disease with I-131  The patient has been treated with levothyroxine in varying doses, ranging from 100 up to 137 mcg  In 5/16 she had a high TSH level with using generic levothyroxine At that time she had been complaining of some fatigue and with increasing her dose to 112 g she started having a little better energy  She is taking brand name Synthroid  No unusual fatigue since her last visit and no cold intolerance She is trying to lose weight since her last physical           Not taking any calcium or iron supplements with the thyroid supplement which she takes in am and has been quite compliant with this     Her TSH levels have been gradually increasing since 8/17   Lab Results  Component Value Date   TSH 3.16 07/25/2016   TSH 1.92 01/25/2016   TSH 0.74 11/13/2015   FREET4 0.98 07/25/2016   FREET4 1.03 01/25/2016   FREET4 1.36 07/23/2015    Wt Readings from Last 3 Encounters:  07/28/16 187 lb 12.8 oz (85.2 kg)  07/15/16 187 lb 8 oz (85 kg)  04/21/16 190 lb 12 oz (86.5 kg)     Allergies as of 07/28/2016      Reactions   Demerol [meperidine] Rash   Diphenhydramine Hcl    REACTION: abd cramps and diarrhea   Meperidine Hcl    REACTION: rash   Prochlorperazine Edisylate    REACTION: increased heart rate      Medication List       Accurate as of 07/28/16  8:25 AM. Always use your most recent med list.          clobetasol cream 0.05 % Commonly known as:  TEMOVATE Apply 1 application topically 2 (two) times daily.   levothyroxine 112 MCG tablet Commonly known as:  SYNTHROID, LEVOTHROID Take 1 tablet (112 mcg total) by mouth daily before breakfast.   Vitamin D3 3000 units Tabs Take 5,000 Units by  mouth.       Allergies:  Allergies  Allergen Reactions  . Demerol [Meperidine] Rash  . Diphenhydramine Hcl     REACTION: abd cramps and diarrhea  . Meperidine Hcl     REACTION: rash  . Prochlorperazine Edisylate     REACTION: increased heart rate    Past Medical History:  Diagnosis Date  . 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 Unspecified essential hypertension 10/21/2008    Past Surgical History:  Procedure Laterality Date  . ABDOMINAL HYSTERECTOMY  3710,6269   partial, fibroids  . BREAST LUMPECTOMY     right,benign    Family History  Problem Relation Age of Onset  . Goiter Mother   . Heart disease Father   . Hypothyroidism Sister     Social History:  reports that she has never smoked. She has never used smokeless tobacco. She reports that she drinks alcohol. She reports that she does not use drugs.  REVIEW Of SYSTEMS:   Her baseline vitamin D level was 18 and has not been taking her supplement Regularly again  ?  HYPERTENSION: Her blood pressure appears to be higher than  usual over the last few months especially diastolic, has not been recommended treatment  BP Readings from Last 3 Encounters:  07/28/16 140/86  07/15/16 (!) 142/90  04/21/16 140/84     Examination:   BP 140/86 (Cuff Size: Normal)   Pulse 72   Ht 5\' 5"  (1.651 m)   Wt 187 lb 12.8 oz (85.2 kg)   SpO2 97%   BMI 31.25 kg/m   No abnormality of anterior neck on examination No peripheral edema Biceps reflexes are normal    Assessment   Hypothyroidism, post ablative, long-standing. Her TSH is normal and she is asymptomatic however her TSH is tending to be on the higher side now She takes 112 g of brand name Synthroid and she is taking this every day consistently  She prefers the brand name supplement but is now concerned about the cost, it is now costing her $1 per pill  Vitamin D deficiency: She is not taking her  supplement regularly and discussed that she will need to  Take this consistently     Treatment:  She will continue brand name 112 g Synthroid but will take extra half tablet weekly Again information given on Synthroid direct program and she will look into this   Follow-upin 4 months again with repeat labs  Vitamin D3 5000 units daily, She can take this with her Synthroid  She needs to see her PCP for blood pressure, does appear to have mild hypertension   Caldonia Leap 07/28/2016, 8:25 AM

## 2016-07-28 ENCOUNTER — Ambulatory Visit (INDEPENDENT_AMBULATORY_CARE_PROVIDER_SITE_OTHER): Payer: PPO | Admitting: Endocrinology

## 2016-07-28 ENCOUNTER — Encounter: Payer: Self-pay | Admitting: Endocrinology

## 2016-07-28 VITALS — BP 140/86 | HR 72 | Ht 65.0 in | Wt 187.8 lb

## 2016-07-28 DIAGNOSIS — E890 Postprocedural hypothyroidism: Secondary | ICD-10-CM | POA: Diagnosis not present

## 2016-07-28 DIAGNOSIS — E559 Vitamin D deficiency, unspecified: Secondary | ICD-10-CM | POA: Diagnosis not present

## 2016-07-28 NOTE — Patient Instructions (Signed)
Take extra 1/2 pill weekly 

## 2016-08-04 ENCOUNTER — Other Ambulatory Visit: Payer: Self-pay | Admitting: Endocrinology

## 2016-09-27 ENCOUNTER — Telehealth: Payer: Self-pay | Admitting: Family Medicine

## 2016-09-27 NOTE — Telephone Encounter (Signed)
Left pt message asking to call Allison back directly at [phone] to schedule AWV + labs with Lesia and CPE with PCP. °

## 2016-09-29 ENCOUNTER — Ambulatory Visit (INDEPENDENT_AMBULATORY_CARE_PROVIDER_SITE_OTHER): Payer: PPO | Admitting: Family Medicine

## 2016-09-29 ENCOUNTER — Encounter: Payer: Self-pay | Admitting: Family Medicine

## 2016-09-29 VITALS — BP 140/82 | HR 79 | Temp 98.2°F | Ht 65.0 in | Wt 185.4 lb

## 2016-09-29 DIAGNOSIS — S30861A Insect bite (nonvenomous) of abdominal wall, initial encounter: Secondary | ICD-10-CM | POA: Diagnosis not present

## 2016-09-29 DIAGNOSIS — S0083XA Contusion of other part of head, initial encounter: Secondary | ICD-10-CM | POA: Diagnosis not present

## 2016-09-29 DIAGNOSIS — W57XXXA Bitten or stung by nonvenomous insect and other nonvenomous arthropods, initial encounter: Secondary | ICD-10-CM

## 2016-09-29 DIAGNOSIS — W19XXXA Unspecified fall, initial encounter: Secondary | ICD-10-CM

## 2016-09-29 NOTE — Progress Notes (Addendum)
HPI:  Acute visit for bruising of face: -s/p fall 4 days ago -tripped over a fence wire and hit forehead  -she also bruised R elbow and ankle -no LOC, neck pain, headache, vision changes, weakness, numbness, speech changes or any neurological symptoms -she has had some pain in the frontal area where she hit -she has had bruising/swelling of the forehead, and around eyes   Also had a tick bite 2 days ago. Remove tick. Small black. something at bite site. No fevers, malaise, rash or any other symptoms.  ROS: See pertinent positives and negatives per HPI.  Past Medical History:  Diagnosis Date  . 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 Unspecified essential hypertension 10/21/2008    Past Surgical History:  Procedure Laterality Date  . ABDOMINAL HYSTERECTOMY  9735,3299   partial, fibroids  . BREAST LUMPECTOMY     right,benign    Family History  Problem Relation Age of Onset  . Goiter Mother   . Heart disease Father   . Hypothyroidism Sister     Social History   Social History  . Marital status: Married    Spouse name: N/A  . Number of children: N/A  . Years of education: N/A   Social History Main Topics  . Smoking status: Never Smoker  . Smokeless tobacco: Never Used  . Alcohol use Yes     Comment: occ glass of wine  . Drug use: No  . Sexual activity: Not Asked   Other Topics Concern  . None   Social History Narrative  . None     Current Outpatient Prescriptions:  .  Cholecalciferol (VITAMIN D3) 3000 units TABS, Take 5,000 Units by mouth., Disp: , Rfl:  .  clobetasol cream (TEMOVATE) 2.42 %, Apply 1 application topically 2 (two) times daily. , Disp: , Rfl:  .  SYNTHROID 112 MCG tablet, TAKE 1 TABLET (112 MCG TOTAL) BY MOUTH DAILY BEFORE BREAKFAST., Disp: 90 tablet, Rfl: 3  EXAM:  Vitals:   09/29/16 1023  BP: 140/82  Pulse: 79  Temp: 98.2 F (36.8 C)    Body mass index is 30.85  kg/m.  GENERAL: vitals reviewed and listed above, alert, oriented, appears well hydrated and in no acute distress  HEENT: mild TTP and hematoma L frontal region with mild bruising and edema around medial eye - no TTP around orbits or other bony areas of the face, visual acuity grossly intact, EOMI, conjunttiva clear, no obvious abnormalities on inspection of external nose and ears  NECK: no obvious masses on inspection, normal ROM, no bony TTP  LUNGS: clear to auscultation bilaterally, no wheezes, rales or rhonchi, good air movement  CV: HRRR, no peripheral edema  SKIN: small area of erythema and induration L flank  MS: moves all extremities without noticeable abnormality  PSYCH/NEURO: pleasant and cooperative, no obvious depression or anxiety, CN II-XII grossly intact, finger to nose normal, gait normal  ASSESSMENT AND PLAN:  Discussed the following assessment and plan:  Fall, initial encounter Contusion of face, initial encounter -seems to have had not severe symptoms or symptoms to suggest intracranial trauma, concussion or bony injury - did offer/discuss neuroimaging to exclude given age and degree of bruising, they opted to hold off on this and observe. They did agree to seek emergency care immediately if any concerning symptoms develop. -ice for bruising, she does not feel needs something for pain  -close follow up to recheck and ensure healing  well  Tick bite, initial encounter -looks like has local reaction - topical tx for this. Discussed signs and symptoms of tick born illness and return precautions.  Mildly elevated BP: -recheck on follow up with PCP in about 1 week  -Patient advised to return or notify a doctor immediately if symptoms worsen or persist or new concerns arise.  Patient Instructions  Please follow up with your doctor in about 1 week.  Ice packs a few times daily for bruising.  Tylenol if needed for pain per instructions.  Seek emergency care  immediately if bad headache, dizziness, vomiting, vision or other neurological concerns, worsening or new concerns.   For the tick bite use topical antibiotic ointment and hydrocortisone cream. Seek care prompty if any illness, rash or concerns over the next several weeks.   Colin Benton R., DO

## 2016-09-29 NOTE — Patient Instructions (Signed)
Please follow up with your doctor in about 1 week.  Ice packs a few times daily for bruising.  Tylenol if needed for pain per instructions.  Seek emergency care immediately if bad headache, dizziness, vomiting, vision or other neurological concerns, worsening or new concerns.   For the tick bite use topical antibiotic ointment and hydrocortisone cream. Seek care prompty if any illness, rash or concerns over the next several weeks.

## 2016-10-05 ENCOUNTER — Ambulatory Visit (INDEPENDENT_AMBULATORY_CARE_PROVIDER_SITE_OTHER): Payer: PPO | Admitting: Family Medicine

## 2016-10-05 ENCOUNTER — Encounter: Payer: Self-pay | Admitting: Family Medicine

## 2016-10-05 VITALS — BP 150/90 | HR 65 | Temp 98.4°F | Ht 65.0 in | Wt 185.2 lb

## 2016-10-05 DIAGNOSIS — R5383 Other fatigue: Secondary | ICD-10-CM | POA: Diagnosis not present

## 2016-10-05 DIAGNOSIS — S0990XD Unspecified injury of head, subsequent encounter: Secondary | ICD-10-CM | POA: Diagnosis not present

## 2016-10-05 DIAGNOSIS — R030 Elevated blood-pressure reading, without diagnosis of hypertension: Secondary | ICD-10-CM

## 2016-10-05 NOTE — Patient Instructions (Signed)

## 2016-10-05 NOTE — Progress Notes (Signed)
Dr. Frederico Hamman T. Malika Demario, MD, [facility] and Sports Medicine [place], [address] Phone: [phone] Fax: [phone]  10/05/2016  Patient: Melissa Cox, MRN: [id number], DOB: December 29, 1950, 66 y.o.  Primary Physician:  Owens Loffler, MD   Chief Complaint  Patient presents with  . Follow-up    Fall-Seen by Dr. Maudie Mercury on 09/29/2016  . Hypertension   Subjective:   Melissa Cox is a 66 y.o. very pleasant female patient who presents with the following:  F/u from fall, hit face / head.   Last Monday, fell caught boot in fence and hit her head up there and scarped and bruised up her R arm and R hip. She has some fairly extensive bruising around her eyes.  She is not having any blurred vision.  ? Pain behind eye, had some trembling. Did have some diarrhea.  Woke up with headaches.. She also has had some nausea and upset stomach.  She generally is otherwise not having any photophobia, phonophobia, and she thinks that she is thinking clearly.  BP Readings from Last 3 Encounters:  10/05/16 (!) 150/90  09/29/16 140/82  07/28/16 140/86    She has had some borderline elevated blood pressures.  Past Medical History, Surgical History, Social History, Family History, Problem List, Medications, and Allergies have been reviewed and updated if relevant.  Patient Active Problem List   Diagnosis Date Noted  . Tick bite of abdomen 07/18/2016  . 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 Unspecified vitamin D deficiency 05/17/2013  . URI (upper respiratory infection) 03/25/2013    Past Medical History:  Diagnosis Date  . 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 Unspecified essential hypertension 10/21/2008    Past Surgical History:  Procedure Laterality Date  . ABDOMINAL HYSTERECTOMY  6503,5465   partial, fibroids  . BREAST LUMPECTOMY     right,benign     Social History   Social History  . Marital status: Married    Spouse name: N/A  . Number of children: N/A  . Years of education: N/A   Occupational History  . Not on file.   Social History Main Topics  . Smoking status: Never Smoker  . Smokeless tobacco: Never Used  . Alcohol use Yes     Comment: occ glass of wine  . Drug use: No  . Sexual activity: Not on file   Other Topics Concern  . Not on file   Social History Narrative  . No narrative on file    Family History  Problem Relation Age of Onset  . Goiter Mother   . Heart disease Father   . Hypothyroidism Sister     Allergies  Allergen Reactions  . Demerol [Meperidine] Rash  . Diphenhydramine Hcl     REACTION: abd cramps and diarrhea  . Meperidine Hcl     REACTION: rash  . Prochlorperazine Edisylate     REACTION: increased heart rate    Medication list reviewed and updated in full in [place].   GEN: No acute illnesses, no fevers, chills. GI: No n/v/d, eating normally Pulm: No SOB Interactive and getting along well at home.  Otherwise, ROS is as per the HPI.  Objective:   BP (!) 150/90   Pulse 65   Temp 98.4 F (36.9 C) (Oral)   Ht 5\' 5"  (1.651 m)   Wt 185 lb 4 oz (84 kg)   BMI  30.83 kg/m   GEN: WDWN, NAD, Non-toxic, A & O x 3 HEENT: Atraumatic, Normocephalic. Neck supple. No masses, No LAD. PERLLA. EOMI. No nystagmus. Ears and Nose: No external deformity. CV: RRR, No M/G/R. No JVD. No thrill. No extra heart sounds. PULM: CTA B, no wheezes, crackles, rhonchi. No retractions. No resp. distress. No accessory muscle use. EXTR: No c/c/e NEURO Normal gait.  PSYCH: Normally interactive. Conversant. Not depressed or anxious appearing.  Calm demeanor.    Neuro: CN 2-12 grossly intact. PERRLA. EOMI. Sensation intact throughout. Str 5/5 all extremities. DTR 2+. No clonus. A and o x 4. Romberg neg. Finger nose neg. Heel -shin neg.    Fairly rapidly unsteady with BESS  testing  Laboratory and Imaging Data:  Assessment and Plan:   Closed head injury, subsequent encounter  Other fatigue - Plan: Vitamin U76, Basic metabolic panel, CBC with Differential/Platelet, TSH, Hepatic function panel, CANCELED: Basic metabolic panel, CANCELED: CBC with Differential/Platelet, CANCELED: Hepatic function panel, CANCELED: TSH, CANCELED: Vitamin B12  Elevated blood pressure reading  With headaches nausea, and being mildly unsteady with her BESS testing, think that you have to assume that this is sequelae from her head injury.  I'm going to place her on physical and mental rest for the rest of the week and weekend.  Elevated blood pressure in a setting with closed head injury. Weight in (lb) to have BMI = 25: 149.9  She currently weighs 185.  I would give her 36 months prior to starting any kind of blood pressure agent.  Follow-up: CPX later in 3-4 mo.   If concussion symptoms aren't resolved in 7-10 days, then I want her to see me again.  Future Appointments Date Time Provider [place]  11/24/2016 8:00 AM [facility] None  11/29/2016 8:15 AM Elayne Snare, MD [facility] None    Meds ordered this encounter  Medications  . Cholecalciferol (VITAMIN D-3) 5000 units TABS    Sig: Take 1 tablet by mouth daily.   Medications Discontinued During This Encounter  Medication Reason  . Cholecalciferol (VITAMIN D3) 3000 units TABS Entry Error   Orders Placed This Encounter  Procedures  . Vitamin B12  . Basic metabolic panel  . CBC with Differential/Platelet  . TSH  . Hepatic function panel    Patient Instructions  HEAD INJURY / CONCUSSSION:   MOST PEOPLE RECOVER FINE AND COMPLETELY FROM A CONCUSSION, BUT THE MOST IMPORTANT THING IS VERY EARLY COMPLETE REST SO THAT THE BRAN CAN RECOVER.  COMPLETE PHYSICAL AND MENTAL REST IS NEEDED.  THAT MEANS: NO SCHOOL OR WORK UNTIL YOU ARE BETTER NO PHYSICAL EXERTION AT ALL UNTIL YOU HAVE NO SYMPTOMS NO  MENTAL EXERTION, MEANING NO WORK, NO HOMEWORK, NO TEST TAKING.  NO DRIVING UNTIL YOU ARE ASYMPTOMATIC.  NO VIDEO GAMES, NO USING THE COMPUTER, NO TEXTING, NO USING SMARTPHONES, NO USE OF AN IPAD OR TABLET. DO NOT GO TO A MOVIE THEATRE OR WATCH SPORTS ON TV. HDTV TENDS TO MAKE PEOPLE FEEL WORSE.   IN OTHER WORDS, DO NOT DO ANYTHING. SIT AND CALMLY REST UNTIL YOU FEEL BETTER. SLEEP IS OK. YOU CAN HANG OUT AND TALK TO A FRIEND.  IT IS DIFFICULT TO KNOW HOW QUICKLY YOU WILL RECOVER. SOME PEOPLE FEEL BETTER IN A FEW DAYS, WHILE OTHER PEOPLE HAVE SYMPTOMS THAT CAN LAST FOR WEEKS TO MONTHS.  EARLY REST IS BY FAR THE MOST IMPORTANT THING.  If any of the following occur notify your physician or go to the [facility]  Emergency Department - if markedly worsening:  . Increased drowsiness, stupor or loss of consciousness . Restlessness or convulsions (fits) . Paralysis in arms or legs . Temperature above 100 F . Vomiting . Severe headache . Blood or clear fluid dripping from the nose or ears . Stiffness of the neck . Dizziness or blurred vision . Pulsating pain in the eye . Unequal pupils of eye . Personality changes . Any other unusual symptoms  PRECAUTIONS . Keep head elevated at all times for the first 24 hours (Elevate mattress if pillow is ineffective) . Do not take tranquilizers, sedatives, narcotics or alcohol . Avoid aspirin. Use only acetaminophen (e.g. Tylenol) or ibuprofen (e.g. Advil) for relief of pain. Follow directions on the bottle for dosage. . Use ice packs for comfort  MEDICATIONS Use medications only as directed by your physician  Concussion Direct trauma to the head often causes a condition known as a concussion. This injury will interfere with brain function and may cause you to lose consciousness. The consequences of a concussion are usually temporary, but repetitive concussions can be very dangerous. If you have multiple concussions, you will have a greater risk  of long-term effects, such as slurred speech, slow movements, impaired thinking, or tremors. The severity of a concussion is based on the length and severity of the interference with brain activity.  SYMPTOMS  Symptoms of a concussion vary depending on the severity of the injury. Very mild concussions may even occur without any noticeable symptoms. Swelling in the area of the injury is not related to the seriousness of the injury.   CAUSES  A concussion is the result of trauma to the head. When the head is subjected to such an injury, the brain strikes against the inner wall of the skull. This impact is what causes the damage to the brain. The force of injury is related to severity of injury. The most severe concussions are associated with incidents that involve large impact forces such as motor vehicle accidents. Wearing a helmet will reduce the severity of trauma to the head, but concussions may still occur if you are wearing a helmet.  RISK INCREASES WITH:  Contact sports (football, hockey, rugby, or lacrosse).  Fighting sports (martial arts or boxing).  Riding bicycles, motorcycles, or horses (when you ride without a helmet).   PREVENTION  Wear proper protective headgear and ensure correct fit.  Wear seat belts when driving and riding in a car.  Do not drink or use mind-altering drugs and drive.   PROGNOSIS  Concussions are typically curable if they are recognized and treated early. If a severe concussion or multiple concussions go untreated, then the complications may be life-threatening or cause permanent disability and brain damage.  RELATED COMPLICATIONS   Permanent brain damage (slurred speech, slow movement, impaired thinking, or tremors).  Bleeding under the skull (subdural hemorrhage or hematoma, epidural hematoma).  Bleeding into the brain.  Prolonged healing time if usual activities are resumed too soon.  Infection if skin over the concussion site is  broken.  Increased risk of future concussions (less trauma is required for a second concussion than the first).     Signed,  Maud Deed. Neale Marzette, MD   Allergies as of 10/05/2016      Reactions   Demerol [meperidine] Rash   Diphenhydramine Hcl    REACTION: abd cramps and diarrhea   Meperidine Hcl    REACTION: rash   Prochlorperazine Edisylate    REACTION: increased heart rate  Medication List       Accurate as of 10/05/16 11:59 PM. Always use your most recent med list.          clobetasol cream 0.05 % Commonly known as:  TEMOVATE Apply 1 application topically 2 (two) times daily.   SYNTHROID 112 MCG tablet Generic drug:  levothyroxine TAKE 1 TABLET (112 MCG TOTAL) BY MOUTH DAILY BEFORE BREAKFAST.   Vitamin D-3 5000 units Tabs Take 1 tablet by mouth daily.

## 2016-10-13 ENCOUNTER — Telehealth: Payer: Self-pay | Admitting: Family Medicine

## 2016-10-13 NOTE — Telephone Encounter (Signed)
Spoke with Dr. Lorelei Pont.  He is not able to open slot today for heel pain.

## 2016-10-13 NOTE — Telephone Encounter (Signed)
Pt is having heel pain and is requesting appt this pm.  Please advice if PCP will open slot.  100% full with all [facility] providers / lt

## 2016-10-14 ENCOUNTER — Ambulatory Visit (INDEPENDENT_AMBULATORY_CARE_PROVIDER_SITE_OTHER)
Admission: RE | Admit: 2016-10-14 | Discharge: 2016-10-14 | Disposition: A | Discharge status: Home / Self Care | Payer: PPO | Source: Ambulatory Visit | Attending: Internal Medicine | Admitting: Internal Medicine

## 2016-10-14 ENCOUNTER — Encounter: Payer: Self-pay | Admitting: Internal Medicine

## 2016-10-14 ENCOUNTER — Ambulatory Visit (INDEPENDENT_AMBULATORY_CARE_PROVIDER_SITE_OTHER): Payer: PPO | Admitting: Internal Medicine

## 2016-10-14 VITALS — BP 134/86 | HR 65 | Temp 98.1°F | Wt 184.0 lb

## 2016-10-14 DIAGNOSIS — M79671 Pain in right foot: Secondary | ICD-10-CM | POA: Insufficient documentation

## 2016-10-14 DIAGNOSIS — M19071 Primary osteoarthritis, right ankle and foot: Secondary | ICD-10-CM | POA: Diagnosis not present

## 2016-10-14 DIAGNOSIS — R5383 Other fatigue: Secondary | ICD-10-CM

## 2016-10-14 LAB — CBC WITH DIFFERENTIAL/PLATELET
BASOS ABS: 0.1 10*3/uL (ref 0.0–0.1)
Basophils Relative: 1 % (ref 0.0–3.0)
EOS ABS: 0.3 10*3/uL (ref 0.0–0.7)
Eosinophils Relative: 3.6 % (ref 0.0–5.0)
HCT: 42.9 % (ref 36.0–46.0)
HEMOGLOBIN: 14.4 g/dL (ref 12.0–15.0)
LYMPHS ABS: 2.6 10*3/uL (ref 0.7–4.0)
Lymphocytes Relative: 32.3 % (ref 12.0–46.0)
MCHC: 33.6 g/dL (ref 30.0–36.0)
MCV: 87.3 fl (ref 78.0–100.0)
Monocytes Absolute: 0.6 10*3/uL (ref 0.1–1.0)
Monocytes Relative: 7.3 % (ref 3.0–12.0)
NEUTROS PCT: 55.8 % (ref 43.0–77.0)
Neutro Abs: 4.6 10*3/uL (ref 1.4–7.7)
Platelets: 311 10*3/uL (ref 150.0–400.0)
RBC: 4.91 Mil/uL (ref 3.87–5.11)
RDW: 14.5 % (ref 11.5–15.5)
WBC: 8.2 10*3/uL (ref 4.0–10.5)

## 2016-10-14 LAB — BASIC METABOLIC PANEL
BUN: 15 mg/dL (ref 6–23)
CALCIUM: 9.7 mg/dL (ref 8.4–10.5)
CO2: 28 mEq/L (ref 19–32)
CREATININE: 0.88 mg/dL (ref 0.40–1.20)
Chloride: 104 mEq/L (ref 96–112)
GFR: 68.22 mL/min (ref >60.00)
GLUCOSE: 110 mg/dL — AB (ref 70–99)
POTASSIUM: 3.8 meq/L (ref 3.5–5.1)
Sodium: 140 mEq/L (ref 135–145)

## 2016-10-14 LAB — HEPATIC FUNCTION PANEL
ALK PHOS: 66 U/L (ref 39–117)
ALT: 19 U/L (ref 0–35)
AST: 15 U/L (ref 0–37)
Albumin: 4.1 g/dL (ref 3.5–5.2)
BILIRUBIN TOTAL: 0.4 mg/dL (ref 0.2–1.2)
Bilirubin, Direct: 0.1 mg/dL (ref 0.0–0.3)
Total Protein: 7.1 g/dL (ref 6.0–8.3)

## 2016-10-14 LAB — TSH: TSH: 9.68 u[IU]/mL — AB (ref 0.35–4.50)

## 2016-10-14 LAB — VITAMIN B12: Vitamin B-12: 269 pg/mL (ref 211–911)

## 2016-10-14 NOTE — Progress Notes (Signed)
   Subjective:    Patient ID: Melissa Cox, female    DOB: 1950-06-07, 66 y.o.   MRN: [id number]  HPI Here due to foot pain---with husband  Feels better from the concussion  2 days ago, she got up and found she couldn't put weight on her right foot Has had some milder problems for 2 months but not limiting---- noticed it when first getting up Pain is at the heel  May be some better now Can bear weight if overpronates  Tried some motrin -- 200mg  twice (no clear help) Ice and epsom salts soaking--may be some help  Current Outpatient Prescriptions on File Prior to Visit  Medication Sig Dispense Refill  . Cholecalciferol (VITAMIN D-3) 5000 units TABS Take 1 tablet by mouth daily.    . clobetasol cream (TEMOVATE) 2.48 % Apply 1 application topically 2 (two) times daily.     Marland Kitchen SYNTHROID 112 MCG tablet TAKE 1 TABLET (112 MCG TOTAL) BY MOUTH DAILY BEFORE BREAKFAST. 90 tablet 3   No current facility-administered medications on file prior to visit.     Allergies  Allergen Reactions  . Demerol [Meperidine] Rash  . Diphenhydramine Hcl     REACTION: abd cramps and diarrhea  . Meperidine Hcl     REACTION: rash  . Prochlorperazine Edisylate     REACTION: increased heart rate    Past Medical History:  Diagnosis Date  . 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 Unspecified essential hypertension 10/21/2008    Past Surgical History:  Procedure Laterality Date  . ABDOMINAL HYSTERECTOMY  2500,3704   partial, fibroids  . BREAST LUMPECTOMY     right,benign    Family History  Problem Relation Age of Onset  . Goiter Mother   . Heart disease Father   . Hypothyroidism Sister     Social History   Social History  . Marital status: Married    Spouse name: N/A  . Number of children: N/A  . Years of education: N/A   Occupational History  . Not on file.   Social History Main Topics  . Smoking status: Never  Smoker  . Smokeless tobacco: Never Used  . Alcohol use Yes     Comment: occ glass of wine  . Drug use: No  . Sexual activity: Not on file   Other Topics Concern  . Not on file   Social History Narrative  . No narrative on file   Review of Systems  Did hit her right ankle laterally in recent fall No fever No joint swelling     Objective:   Physical Exam  Constitutional: No distress.  Musculoskeletal:  Mild tenderness over right heel Pain with full dorsiflexion of foot No bony tenderness in foot/ankle          Assessment & Plan:

## 2016-10-14 NOTE — Assessment & Plan Note (Addendum)
Mild for 2 months---sounds like typical plantar fasciitis Doing lots of garden work in [place], structural boots--probably no arch support Now can't bear weight X-ray negative  Will try walking boot Ice prn Increase NSAID If no better next week, set up with podiatry to consider injection

## 2016-10-14 NOTE — Patient Instructions (Signed)
Please increase the ibuprofen to 400-600mg  three times daily with meals Try ice intermittently If not better by next week, we will set you up with a podiatrist

## 2016-10-14 NOTE — Addendum Note (Signed)
Addended by: Ellamae Sia on: 10/14/2016 09:23 AM   Modules accepted: Orders

## 2016-10-17 ENCOUNTER — Telehealth: Payer: Self-pay | Admitting: Family Medicine

## 2016-10-17 ENCOUNTER — Other Ambulatory Visit: Payer: Self-pay | Admitting: Family Medicine

## 2016-10-17 DIAGNOSIS — R7989 Other specified abnormal findings of blood chemistry: Secondary | ICD-10-CM

## 2016-10-17 DIAGNOSIS — M79671 Pain in right foot: Secondary | ICD-10-CM

## 2016-10-17 NOTE — Telephone Encounter (Signed)
I advised the pt that Dr Silvio Pate was out of the office this week and I was unsure how quickly the referral would get done once he created it. She has [facility] and [facility]. SHe will call around to see if she can get in on her own this week. She still wants a referral just in case she cannot get in on her own.

## 2016-10-17 NOTE — Telephone Encounter (Signed)
Pt is still having foot pain and would like referral to podiatry, either [place] or [place] is fine.   cb number is [phone] Thanks

## 2016-10-18 NOTE — Telephone Encounter (Signed)
Let her know I put in the referral

## 2016-10-18 NOTE — Telephone Encounter (Signed)
Spoke to pt

## 2016-10-18 NOTE — Telephone Encounter (Signed)
Left message to call office

## 2016-10-27 ENCOUNTER — Encounter: Payer: Self-pay | Admitting: Family Medicine

## 2016-10-27 ENCOUNTER — Telehealth: Payer: Self-pay

## 2016-10-27 ENCOUNTER — Ambulatory Visit (INDEPENDENT_AMBULATORY_CARE_PROVIDER_SITE_OTHER)
Admission: RE | Admit: 2016-10-27 | Discharge: 2016-10-27 | Disposition: A | Discharge status: Home / Self Care | Payer: PPO | Source: Ambulatory Visit | Attending: Family Medicine | Admitting: Family Medicine

## 2016-10-27 ENCOUNTER — Ambulatory Visit (INDEPENDENT_AMBULATORY_CARE_PROVIDER_SITE_OTHER): Payer: PPO | Admitting: Family Medicine

## 2016-10-27 VITALS — BP 146/84 | HR 77 | Temp 98.5°F | Ht 65.0 in | Wt 183.8 lb

## 2016-10-27 DIAGNOSIS — R1030 Lower abdominal pain, unspecified: Secondary | ICD-10-CM | POA: Diagnosis not present

## 2016-10-27 DIAGNOSIS — R109 Unspecified abdominal pain: Secondary | ICD-10-CM | POA: Diagnosis not present

## 2016-10-27 LAB — CBC WITH DIFFERENTIAL/PLATELET
Basophils Absolute: 0.1 10*3/uL (ref 0.0–0.1)
Basophils Relative: 0.6 % (ref 0.0–3.0)
Eosinophils Absolute: 0.2 10*3/uL (ref 0.0–0.7)
Eosinophils Relative: 2.2 % (ref 0.0–5.0)
HCT: 41.9 % (ref 36.0–46.0)
Hemoglobin: 14 g/dL (ref 12.0–15.0)
LYMPHS ABS: 2.8 10*3/uL (ref 0.7–4.0)
Lymphocytes Relative: 29.6 % (ref 12.0–46.0)
MCHC: 33.3 g/dL (ref 30.0–36.0)
MCV: 86.8 fl (ref 78.0–100.0)
MONOS PCT: 6.4 % (ref 3.0–12.0)
Monocytes Absolute: 0.6 10*3/uL (ref 0.1–1.0)
NEUTROS ABS: 5.8 10*3/uL (ref 1.4–7.7)
NEUTROS PCT: 61.2 % (ref 43.0–77.0)
Platelets: 298 10*3/uL (ref 150.0–400.0)
RBC: 4.83 Mil/uL (ref 3.87–5.11)
RDW: 14.5 % (ref 11.5–15.5)
WBC: 9.5 10*3/uL (ref 4.0–10.5)

## 2016-10-27 LAB — BASIC METABOLIC PANEL
BUN: 16 mg/dL (ref 6–23)
CALCIUM: 9.5 mg/dL (ref 8.4–10.5)
CO2: 28 meq/L (ref 19–32)
Chloride: 104 mEq/L (ref 96–112)
Creatinine, Ser: 0.77 mg/dL (ref 0.40–1.20)
GFR: 79.58 mL/min (ref >60.00)
GLUCOSE: 105 mg/dL — AB (ref 70–99)
POTASSIUM: 3.8 meq/L (ref 3.5–5.1)
SODIUM: 140 meq/L (ref 135–145)

## 2016-10-27 LAB — HEPATIC FUNCTION PANEL
ALBUMIN: 4.3 g/dL (ref 3.5–5.2)
ALT: 19 U/L (ref 0–35)
AST: 16 U/L (ref 0–37)
Alkaline Phosphatase: 66 U/L (ref 39–117)
Bilirubin, Direct: 0.1 mg/dL (ref 0.0–0.3)
Total Bilirubin: 0.6 mg/dL (ref 0.2–1.2)
Total Protein: 7.4 g/dL (ref 6.0–8.3)

## 2016-10-27 LAB — POC URINALSYSI DIPSTICK (AUTOMATED)
BILIRUBIN UA: NEGATIVE
GLUCOSE UA: NEGATIVE
Ketones, UA: NEGATIVE
Leukocytes, UA: NEGATIVE
Nitrite, UA: NEGATIVE
Protein, UA: NEGATIVE
UROBILINOGEN UA: 0.2 U/dL
pH, UA: 5.5 (ref 5.0–8.0)

## 2016-10-27 LAB — LIPASE: LIPASE: 15 U/L (ref 11.0–59.0)

## 2016-10-27 LAB — H. PYLORI ANTIBODY, IGG: H PYLORI IGG: NEGATIVE

## 2016-10-27 MED ORDER — IOPAMIDOL (ISOVUE-300) INJECTION 61%
100.0000 mL | Freq: Once | INTRAVENOUS | Status: AC | PRN
  Administered 2016-10-27: 100 mL via INTRAVENOUS

## 2016-10-27 MED ORDER — AMOXICILLIN-POT CLAVULANATE 875-125 MG PO TABS: 1.0000 | ORAL_TABLET | Freq: Two times a day (BID) | ORAL | 0 refills | Status: DC

## 2016-10-27 NOTE — Telephone Encounter (Signed)
Spoke to patient. Gave results and med instructions. Patient verbalized understanding. Sent Rx to pharmacy verified on file.

## 2016-10-27 NOTE — Telephone Encounter (Addendum)
Stacy with CT called report CT abd and pelvis w contrast; report in Epic. Walked report to Dr Lorelei Pont; pt is waiting. Dr Lorelei Pont said he has seen the note and to get the pts pharmacy info so he can send in abx for diverticulitis. Stacy notified and will let pt know; pt uses [facility]. Diane at [facility] radiology called report but I advised had already spoken with [facility] and she needed nothing further.

## 2016-10-27 NOTE — Progress Notes (Signed)
Dr. Frederico Hamman T. Andraya Frigon, MD, [facility] Primary [facility] and Sports Medicine [place], [address] Phone: [phone] Fax: [phone]  10/27/2016  Patient: Melissa Cox, MRN: [id number], DOB: May 27, 1950, 66 y.o.  Primary Physician:  Owens Loffler, MD   Chief Complaint  Patient presents with  . Abdominal Pain    low abd pain/pressure comes and goes-started Sunday with sharp pain   Subjective:   Melissa Cox is a 66 y.o. very pleasant female patient who presents with the following:  Started last Sunday, began for about at hour. No change in the BM. Down in the hypogastric region.   She denies any bloody stools or black tarry stools. She has never had colonoscopy, but she had a negative Cologuard last year.   No change in urinary habits. No pain or frequency. No fever. No chest pain. No short of breath.  Past Medical History, Surgical History, Social History, Family History, Problem List, Medications, and Allergies have been reviewed and updated if relevant.  Patient Active Problem List   Diagnosis Date Noted  . Pain of right heel 10/14/2016  . 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 Unspecified vitamin D deficiency 05/17/2013    Past Medical History:  Diagnosis Date  . 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 Unspecified essential hypertension 10/21/2008    Past Surgical History:  Procedure Laterality Date  . ABDOMINAL HYSTERECTOMY  7591,6384   partial, fibroids  . BREAST LUMPECTOMY     right,benign    Social History   Social History  . Marital status: Married    Spouse name: N/A  . Number of children: N/A  . Years of education: N/A   Occupational History  . Not on file.   Social History Main Topics  . Smoking status: Never Smoker  . Smokeless tobacco: Never Used  . Alcohol use Yes     Comment: occ glass of wine  . Drug use: No  . Sexual  activity: Not on file   Other Topics Concern  . Not on file   Social History Narrative  . No narrative on file    Family History  Problem Relation Age of Onset  . Goiter Mother   . Heart disease Father   . Hypothyroidism Sister     Allergies  Allergen Reactions  . Demerol [Meperidine] Rash  . Diphenhydramine Hcl     REACTION: abd cramps and diarrhea  . Meperidine Hcl     REACTION: rash  . Prochlorperazine Edisylate     REACTION: increased heart rate    Medication list reviewed and updated in full in [place].   GEN: as above GI: as above Pulm: No SOB Interactive and getting along well at home.  Otherwise, ROS is as per the HPI.  Objective:   BP (!) 146/84   Pulse 77   Temp 98.5 F (36.9 C) (Oral)   Ht 5\' 5"  (1.651 m)   Wt 183 lb 12 oz (83.3 kg)   BMI 30.58 kg/m   GEN: WDWN, NAD, Non-toxic, A & O x 3 HEENT: Atraumatic, Normocephalic. Neck supple. No masses, No LAD. Ears and Nose: No external deformity. CV: RRR, No M/G/R. No JVD. No thrill. No extra heart sounds. PULM: CTA B, no wheezes, crackles, rhonchi. No retractions. No resp. distress. No accessory muscle use. ABD: S, tenderness in the left lower quadrant greater than the right lower quadrant. There  is also to a lesser extent pain in the hypogastric and epigastric regions., ND, + BS, No rebound, No HSM  EXTR: No c/c/e NEURO Normal gait.  PSYCH: Normally interactive. Conversant. Not depressed or anxious appearing.  Calm demeanor.   Laboratory and Imaging Data: Results for orders placed or performed in visit on 10/27/16  POCT Urinalysis Dipstick (Automated)  Result Value Ref Range   Color, UA yellow    Clarity, UA clear    Glucose, UA negative    Bilirubin, UA negative    Ketones, UA negative    Spec Grav, UA >=1.030 (A) 1.010 - 1.025   Blood, UA trace    pH, UA 5.5 5.0 - 8.0   Protein, UA negative    Urobilinogen, UA 0.2 0.2 or 1.0 E.U./dL   Nitrite, UA negative    Leukocytes, UA  Negative Negative     Assessment and Plan:   Lower abdominal pain - Plan: POCT Urinalysis Dipstick (Automated), Basic metabolic panel, CBC with Differential/Platelet, Hepatic function panel, Lipase, H. pylori antibody, IgG, CT ABDOMEN PELVIS W CONTRAST, CANCELED: CT ABDOMEN PELVIS W CONTRAST  Abdominal pain of unclear etiology.  Check basic labs above. Given location, concern for diverticulitis high on differential versus other unknown pathology, obtain a CT of the abdomen and pelvis with contrast to evaluate.  Follow-up: No Follow-up on file.  Future Appointments Date Time Provider [place]  10/27/2016 2:30 PM [facility]  10/31/2016 3:30 PM Luis M. Cintron, Max T, DPM [facility]  11/24/2016 8:00 AM [facility] None  11/29/2016 8:15 AM Elayne Snare, MD [facility] None    No orders of the defined types were placed in this encounter.  There are no discontinued medications. Orders Placed This Encounter  Procedures  . CT ABDOMEN PELVIS W CONTRAST  . Basic metabolic panel  . CBC with Differential/Platelet  . Hepatic function panel  . Lipase  . H. pylori antibody, IgG  . POCT Urinalysis Dipstick (Automated)    Signed,  Aleksandr Pellow T. Junior Huezo, MD   Allergies as of 10/27/2016      Reactions   Demerol [meperidine] Rash   Diphenhydramine Hcl    REACTION: abd cramps and diarrhea   Meperidine Hcl    REACTION: rash   Prochlorperazine Edisylate    REACTION: increased heart rate      Medication List       Accurate as of 10/27/16  1:55 PM. Always use your most recent med list.          clobetasol cream 0.05 % Commonly known as:  TEMOVATE Apply 1 application topically 2 (two) times daily.   SYNTHROID 112 MCG tablet Generic drug:  levothyroxine TAKE 1 TABLET (112 MCG TOTAL) BY MOUTH DAILY BEFORE BREAKFAST.   Vitamin D-3 5000 units Tabs Take 1 tablet by mouth daily.

## 2016-10-27 NOTE — Telephone Encounter (Signed)
She has diverticulitis  Please call in Augmentin 875 mg, 1 po bid, #20, 0 ref

## 2016-10-27 NOTE — Patient Instructions (Signed)

## 2016-10-31 ENCOUNTER — Ambulatory Visit (INDEPENDENT_AMBULATORY_CARE_PROVIDER_SITE_OTHER): Payer: PPO

## 2016-10-31 ENCOUNTER — Encounter: Payer: Self-pay | Admitting: Podiatry

## 2016-10-31 ENCOUNTER — Ambulatory Visit (INDEPENDENT_AMBULATORY_CARE_PROVIDER_SITE_OTHER): Payer: PPO | Admitting: Podiatry

## 2016-10-31 DIAGNOSIS — M722 Plantar fascial fibromatosis: Secondary | ICD-10-CM | POA: Diagnosis not present

## 2016-10-31 MED ORDER — MELOXICAM 15 MG PO TABS: 15.0000 mg | ORAL_TABLET | Freq: Every day | ORAL | 3 refills | Status: DC

## 2016-10-31 MED ORDER — METHYLPREDNISOLONE 4 MG PO TBPK: ORAL_TABLET | ORAL | 0 refills | Status: DC

## 2016-10-31 NOTE — Progress Notes (Addendum)
   Subjective:    Patient ID: Melissa Cox, female    DOB: 24-Nov-1950, 66 y.o.   MRN: [id number]  HPI: She presents today with a chief complaint of a painful right heel. She states this been aching for about 3 weeks mornings are particularly bad. Denies any trauma.    Review of Systems  All other systems reviewed and are negative.      Objective:   Physical Exam: Vital signs are stable alert and oriented 3. Pulses are palpable. Neurologic sensorium is intact and reflexes are intact muscle strength is normal symmetrical bilateral. Orthopedic evaluation was rates rectus foot type pain on palpation medial calcaneal tubercle of the right heel good full range of motion all joints distal to the ankle. Cutaneous evaluation demonstrate no open lesions or wounds. Radiographic evaluation demonstrates soft tissue increase in density of the plantar fascial cannula surgeons site.        Assessment & Plan:  Plantar fasciitis right foot.  Plan: Injected right heel today with Kenalog and local anesthetic posterior plantar fascial brace and a night splint. Discussed appropriate shoe gear stretching exercises ice therapy shear modifications started her on a Medrol Dosepak followed by meloxicam. Follow up with her in 1 month.

## 2016-11-07 ENCOUNTER — Ambulatory Visit (INDEPENDENT_AMBULATORY_CARE_PROVIDER_SITE_OTHER): Payer: PPO | Admitting: Family Medicine

## 2016-11-07 ENCOUNTER — Encounter: Payer: Self-pay | Admitting: Family Medicine

## 2016-11-07 VITALS — BP 140/90 | HR 66 | Temp 98.3°F | Ht 65.0 in | Wt 185.2 lb

## 2016-11-07 DIAGNOSIS — Z1211 Encounter for screening for malignant neoplasm of colon: Secondary | ICD-10-CM | POA: Diagnosis not present

## 2016-11-07 DIAGNOSIS — I889 Nonspecific lymphadenitis, unspecified: Secondary | ICD-10-CM

## 2016-11-07 DIAGNOSIS — K5792 Diverticulitis of intestine, part unspecified, without perforation or abscess without bleeding: Secondary | ICD-10-CM

## 2016-11-07 MED ORDER — AMOXICILLIN-POT CLAVULANATE 875-125 MG PO TABS: 1.0000 | ORAL_TABLET | Freq: Two times a day (BID) | ORAL | 0 refills | Status: AC

## 2016-11-07 NOTE — Patient Instructions (Signed)

## 2016-11-07 NOTE — Progress Notes (Signed)
Dr. Frederico Hamman T. Taylon Coole, MD, [facility] [place], [address] Phone: [phone] Fax: [phone]  11/07/2016  Patient: Melissa Cox, MRN: [id number], DOB: [birth year], 66 y.o.  Primary Physician:  Owens Loffler, MD   Chief Complaint  Patient presents with  . Follow-up    diverticulitis  . pain upper left jaw    knot in neck   Subjective:   Melissa Cox is a 66 y.o. very pleasant female patient who presents with the following:  Much better but not completely better. About 90% better, rolling over in bed and does not hurt. She is still having some occasional pain in left lower quadrant as well as the epigastric region. Overall, it is notably better than before.   She is having some pain in the upper left jaw region also and she has a lymph node that is enlarged and painful in his anterior cervical chain.  Past Medical History, Surgical History, Social History, Family History, Problem List, Medications, and Allergies have been reviewed and updated if relevant.  Patient Active Problem List   Diagnosis Date Noted  . Pain of right heel 10/14/2016  . 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 Unspecified vitamin D deficiency 05/17/2013    Past Medical History:  Diagnosis Date  . 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 Unspecified essential hypertension 10/21/2008    Past Surgical History:  Procedure Laterality Date  . ABDOMINAL HYSTERECTOMY  8657,8469   partial, fibroids  . BREAST LUMPECTOMY     right,benign    Social History   Social History  . Marital status: Married    Spouse name: N/A  . Number of children: N/A  . Years of education: N/A   Occupational History  . Not on file.   Social History Main Topics  . Smoking status: Never Smoker  . Smokeless tobacco: Never Used  . Alcohol use Yes     Comment: occ  glass of wine  . Drug use: No  . Sexual activity: Not on file   Other Topics Concern  . Not on file   Social History Narrative  . No narrative on file    Family History  Problem Relation Age of Onset  . Goiter Mother   . Heart disease Father   . Hypothyroidism Sister     Allergies  Allergen Reactions  . Demerol [Meperidine] Rash  . Diphenhydramine Hcl     REACTION: abd cramps and diarrhea  . Meperidine Hcl     REACTION: rash  . Prochlorperazine Edisylate     REACTION: increased heart rate    Medication list reviewed and updated in full in [facility].   GEN: No acute illnesses, no fevers, chills. GI: No n/v/d, eating normally Pulm: No SOB Interactive and getting along well at home.  Otherwise, ROS is as per the HPI.  Objective:   BP 140/90   Pulse 66   Temp 98.3 F (36.8 C) (Oral)   Ht 5\' 5"  (1.651 m)   Wt 185 lb 4 oz (84 kg)   BMI 30.83 kg/m   GEN: WDWN, NAD, Non-toxic, A & O x 3 HEENT: Atraumatic, Normocephalic. Neck supple. No masses, + L anterior cervical LAD. Ears and Nose: No external deformity. CV: RRR, No M/G/R. No JVD. No thrill. No extra heart sounds. PULM: CTA B, no wheezes, crackles, rhonchi. No retractions. No resp. distress.  No accessory muscle use. ABD: S, mild T epigastric and LLQ, ND, + BS, No rebound, No HSM  EXTR: No c/c/e NEURO Normal gait.  PSYCH: Normally interactive. Conversant. Not depressed or anxious appearing.  Calm demeanor.   Laboratory and Imaging Data: Results for orders placed or performed in visit on 59/56/38  Basic metabolic panel  Result Value Ref Range   Sodium 140 135 - 145 mEq/L   Potassium 3.8 3.5 - 5.1 mEq/L   Chloride 104 96 - 112 mEq/L   CO2 28 19 - 32 mEq/L   Glucose, Bld 105 (H) 70 - 99 mg/dL   BUN 16 6 - 23 mg/dL   Creatinine, Ser 0.77 0.40 - 1.20 mg/dL   Calcium 9.5 8.4 - 10.5 mg/dL   GFR 79.58 >60.00 mL/min  CBC with Differential/Platelet  Result Value Ref Range   WBC 9.5 4.0 - 10.5 K/uL    RBC 4.83 3.87 - 5.11 Mil/uL   Hemoglobin 14.0 12.0 - 15.0 g/dL   HCT 41.9 36.0 - 46.0 %   MCV 86.8 78.0 - 100.0 fl   MCHC 33.3 30.0 - 36.0 g/dL   RDW 14.5 11.5 - 15.5 %   Platelets 298.0 150.0 - 400.0 K/uL   Neutrophils Relative % 61.2 43.0 - 77.0 %   Lymphocytes Relative 29.6 12.0 - 46.0 %   Monocytes Relative 6.4 3.0 - 12.0 %   Eosinophils Relative 2.2 0.0 - 5.0 %   Basophils Relative 0.6 0.0 - 3.0 %   Neutro Abs 5.8 1.4 - 7.7 K/uL   Lymphs Abs 2.8 0.7 - 4.0 K/uL   Monocytes Absolute 0.6 0.1 - 1.0 K/uL   Eosinophils Absolute 0.2 0.0 - 0.7 K/uL   Basophils Absolute 0.1 0.0 - 0.1 K/uL  Hepatic function panel  Result Value Ref Range   Total Bilirubin 0.6 0.2 - 1.2 mg/dL   Bilirubin, Direct 0.1 0.0 - 0.3 mg/dL   Alkaline Phosphatase 66 39 - 117 U/L   AST 16 0 - 37 U/L   ALT 19 0 - 35 U/L   Total Protein 7.4 6.0 - 8.3 g/dL   Albumin 4.3 3.5 - 5.2 g/dL  Lipase  Result Value Ref Range   Lipase 15.0 11.0 - 59.0 U/L  H. pylori antibody, IgG  Result Value Ref Range   H Pylori IgG Negative Negative  POCT Urinalysis Dipstick (Automated)  Result Value Ref Range   Color, UA yellow    Clarity, UA clear    Glucose, UA negative    Bilirubin, UA negative    Ketones, UA negative    Spec Grav, UA >=1.030 (A) 1.010 - 1.025   Blood, UA trace    pH, UA 5.5 5.0 - 8.0   Protein, UA negative    Urobilinogen, UA 0.2 0.2 or 1.0 E.U./dL   Nitrite, UA negative    Leukocytes, UA Negative Negative    Ct Abdomen Pelvis W Contrast  Result Date: 10/27/2016 CLINICAL DATA:  Lower abdominal pain for 4 days EXAM: CT ABDOMEN AND PELVIS WITH CONTRAST TECHNIQUE: Multidetector CT imaging of the abdomen and pelvis was performed using the standard protocol following bolus administration of intravenous contrast. CONTRAST:  125mL ISOVUE-300 IOPAMIDOL (ISOVUE-300) INJECTION 61% COMPARISON:  None. FINDINGS: Lower chest: No acute abnormality. Hepatobiliary: Mild hepatic steatosis. Hepatic cysts are identified with  the largest on image 18. Several low-attenuation lesions are too small to characterize but probably tiny cysts. No suspicious masses. The portal vein is patent. Pancreas: Unremarkable. No pancreatic ductal dilatation  or surrounding inflammatory changes. Spleen: Normal in size without focal abnormality. Adrenals/Urinary Tract: The adrenal glands are normal. Parapelvic cysts are associated with both kidneys with no hydronephrosis. No perinephric stranding or suspicious renal mass. No ureterectasis or ureteral stone. The bladder is normal. Stomach/Bowel: The stomach and small bowel are normal. Colonic diverticuli are identified most numerous in the descending and sigmoid colon. There is pericolonic stranding adjacent sigmoid colon as seen on series 6, image 56 with mild adjacent colonic wall thickening. Diverticuli are identified in this region. No abscess or extraluminal gas identified. Remainder of the colon is unremarkable. The appendix normal. Vascular/Lymphatic: Atherosclerotic change seen in the non aneurysmal aorta. No dissection. No adenopathy. Reproductive: The patient is status post hysterectomy. Other: No free air or free fluid.  Fat containing umbilical hernia. Musculoskeletal: No acute or significant osseous findings. IMPRESSION: 1. Mild colonic wall thickening and adjacent pericolonic fat stranding associated with the sigmoid colon is consistent with mild diverticulitis with no evidence of perforation or abscess. This likely explains the patient's symptoms. 2. Atherosclerosis in the abdominal aorta. These results will be called to the ordering clinician or representative by the Radiologist Assistant, and communication documented in the PACS or zVision Dashboard. Electronically Signed   By: Dorise Bullion III M.D   On: 10/27/2016 15:02   Dg Foot 2 Views Right  Result Date: 10/14/2016 CLINICAL DATA:  Pain EXAM: RIGHT FOOT - 2 VIEW COMPARISON:  None. FINDINGS: Frontal and lateral views were obtained. No  fracture or dislocation. There is mild narrowing of the first MTP and IP joints. There is mild narrowing of all PIP and DIP joints. No erosive change. 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 No fracture or dislocation. Electronically Signed   By: Lowella Grip III M.D.   On: 10/14/2016 07:56   Dg Foot Complete Right  Result Date: 10/31/2016 Please see detailed radiograph report in office note.    Assessment and Plan:   Acute diverticulitis  Colon cancer screening - Plan: Ambulatory referral to Gastroenterology  Lymphadenitis  Resolving diverticulitis versus possible incompletely treated diverticulitis. In the setting of some jaw pain, tooth pain, painful lymph node, we will treat with penicillin based antibiotics. Augmentin to cover all.  Recommended screening colonoscopy.  Follow-up: No Follow-up on file.  Future Appointments Date Time Provider [place]  11/24/2016 8:00 AM [facility] None  11/29/2016 8:15 AM Elayne Snare, MD [facility] None  12/05/2016 1:45 PM Hyatt, Romilda Garret, DPM [facility]    Meds ordered this encounter  Medications  . amoxicillin-clavulanate (AUGMENTIN) 875-125 MG tablet    Sig: Take 1 tablet by mouth 2 (two) times daily.    Dispense:  20 tablet    Refill:  0   There are no discontinued medications. Orders Placed This Encounter  Procedures  . Ambulatory referral to Gastroenterology    Signed,  Frederico Hamman T. Jolleen Seman, MD   Allergies as of 11/07/2016      Reactions   Demerol [meperidine] Rash   Diphenhydramine Hcl    REACTION: abd cramps and diarrhea   Meperidine Hcl    REACTION: rash   Prochlorperazine Edisylate    REACTION: increased heart rate      Medication List       Accurate as of 11/07/16  1:37 PM. Always use your most recent med list.          amoxicillin-clavulanate 875-125 MG tablet Commonly known as:  AUGMENTIN Take 1 tablet by mouth 2 (two) times daily.  clobetasol  cream 0.05 % Commonly known as:  TEMOVATE Apply 1 application topically 2 (two) times daily.   meloxicam 15 MG tablet Commonly known as:  MOBIC Take 1 tablet (15 mg total) by mouth daily.   methylPREDNISolone 4 MG Tbpk tablet Commonly known as:  MEDROL DOSEPAK 6 day dose pack - take as directed   SYNTHROID 112 MCG tablet Generic drug:  levothyroxine TAKE 1 TABLET (112 MCG TOTAL) BY MOUTH DAILY BEFORE BREAKFAST.   Vitamin D-3 5000 units Tabs Take 1 tablet by mouth daily.

## 2016-11-15 NOTE — Telephone Encounter (Signed)
Scheduled 03/06/17.

## 2016-11-23 ENCOUNTER — Encounter: Payer: Self-pay | Admitting: Internal Medicine

## 2016-11-24 ENCOUNTER — Other Ambulatory Visit (INDEPENDENT_AMBULATORY_CARE_PROVIDER_SITE_OTHER): Payer: PPO

## 2016-11-24 DIAGNOSIS — E890 Postprocedural hypothyroidism: Secondary | ICD-10-CM

## 2016-11-24 DIAGNOSIS — E559 Vitamin D deficiency, unspecified: Secondary | ICD-10-CM

## 2016-11-24 LAB — TSH: TSH: 1.12 u[IU]/mL (ref 0.35–4.50)

## 2016-11-24 LAB — VITAMIN D 25 HYDROXY (VIT D DEFICIENCY, FRACTURES): VITD: 29.35 ng/mL — ABNORMAL LOW (ref 30.00–100.00)

## 2016-11-24 LAB — T4, FREE: FREE T4: 1.07 ng/dL (ref 0.60–1.60)

## 2016-11-29 ENCOUNTER — Ambulatory Visit (AMBULATORY_SURGERY_CENTER): Payer: Self-pay | Admitting: *Deleted

## 2016-11-29 ENCOUNTER — Encounter: Payer: Self-pay | Admitting: Endocrinology

## 2016-11-29 ENCOUNTER — Ambulatory Visit (INDEPENDENT_AMBULATORY_CARE_PROVIDER_SITE_OTHER): Payer: PPO | Admitting: Endocrinology

## 2016-11-29 VITALS — BP 134/80 | HR 61 | Ht 65.0 in | Wt 180.4 lb

## 2016-11-29 VITALS — Ht 65.5 in | Wt 181.6 lb

## 2016-11-29 DIAGNOSIS — E890 Postprocedural hypothyroidism: Secondary | ICD-10-CM

## 2016-11-29 DIAGNOSIS — Z1211 Encounter for screening for malignant neoplasm of colon: Secondary | ICD-10-CM

## 2016-11-29 DIAGNOSIS — E559 Vitamin D deficiency, unspecified: Secondary | ICD-10-CM

## 2016-11-29 NOTE — Patient Instructions (Signed)
Take 4000 U of Vitamin D

## 2016-11-29 NOTE — Progress Notes (Signed)
No egg or soy allergy known to patient  No issues with past sedation with any surgeries  or procedures, no intubation problems  No diet pills per patient No home 02 use per patient  No blood thinners per patient  Pt denies issues with constipation  No A fib or A flutter  EMMI video sent to pt's e mail  

## 2016-11-29 NOTE — Progress Notes (Signed)
Patient ID: Melissa Cox, female   DOB: 1950/08/14, 66 y.o.   MRN: [id number]   Reason for Appointment:  Hypothyroidism, followup visit    History of Present Illness:   The hypothyroidism was first diagnosed in 1977 after treatment of her Graves' disease with I-131  The patient has been treated with levothyroxine in varying doses, ranging from 100 up to 137 mcg  In 5/16 she had a high TSH level with using generic levothyroxine At that time she had been complaining of some fatigue and with increasing her dose to 112 g she started having a little better energy  She is taking brand name Synthroid  No unusual fatigue since her last visit and no cold intolerance However she had some intercurrent medical issues and is only recently starting to feel better  Her PCP was checking her thyroid in late July and this was 9.7 The dosage was not changed           Not taking any calcium or iron supplements with the thyroid supplement which she takes in am and has been quite compliant with this     Her TSH level has now come back to normal    Lab Results  Component Value Date   TSH 1.12 11/24/2016   TSH 9.68 (H) 10/14/2016   TSH 3.16 07/25/2016   FREET4 1.07 11/24/2016   FREET4 0.98 07/25/2016   FREET4 1.03 01/25/2016    Wt Readings from Last 3 Encounters:  11/29/16 180 lb 6.4 oz (81.8 kg)  11/07/16 185 lb 4 oz (84 kg)  10/27/16 183 lb 12 oz (83.3 kg)     Allergies as of 11/29/2016      Reactions   Demerol [meperidine] Rash   Diphenhydramine Hcl    REACTION: abd cramps and diarrhea   Meperidine Hcl    REACTION: rash   Prochlorperazine Edisylate    REACTION: increased heart rate      Medication List       Accurate as of 11/29/16  8:32 AM. Always use your most recent med list.          clobetasol cream 0.05 % Commonly known as:  TEMOVATE Apply 1 application topically 2 (two) times daily.   meloxicam 15 MG tablet Commonly known as:  MOBIC Take 1 tablet (15  mg total) by mouth daily.   SYNTHROID 112 MCG tablet Generic drug:  levothyroxine TAKE 1 TABLET (112 MCG TOTAL) BY MOUTH DAILY BEFORE BREAKFAST.   Vitamin D-3 5000 units Tabs Take 1 tablet by mouth daily.       Allergies:  Allergies  Allergen Reactions  . Demerol [Meperidine] Rash  . Diphenhydramine Hcl     REACTION: abd cramps and diarrhea  . Meperidine Hcl     REACTION: rash  . Prochlorperazine Edisylate     REACTION: increased heart rate    Past Medical History:  Diagnosis Date  . 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 Unspecified essential hypertension 10/21/2008    Past Surgical History:  Procedure Laterality Date  . ABDOMINAL HYSTERECTOMY  9924,2683   partial, fibroids  . BREAST LUMPECTOMY     right,benign    Family History  Problem Relation Age of Onset  . Goiter Mother   . Heart disease Father   . Hypothyroidism Sister     Social History:  reports that she has never smoked. She has never used smokeless tobacco. She reports that  she drinks alcohol. She reports that she does not use drugs.  REVIEW Of SYSTEMS:   Her baseline vitamin D level was 18 and has not been taking her supplement again recently, However her current level is near 30 Previously was taking 5000 units that she only has a 1000 units capsule at home   Her blood pressure Has been periodically high Relatively better today She will have her annual wellness exam in December   BP Readings from Last 3 Encounters:  11/29/16 134/80  11/07/16 140/90  10/27/16 (!) 146/84     Examination:   BP 134/80   Pulse 61   Ht 5\' 5"  (1.651 m)   Wt 180 lb 6.4 oz (81.8 kg)   SpO2 97%   BMI 30.02 kg/m   No Swelling of eyes or face Skin appears normal  Biceps reflexes are normal     Assessment   Hypothyroidism, post ablative, long-standing. Her TSH is normal more recently Not clear why her TSH was high in July even though  she was not taking any interacting supplements or forgetting her medication She feels fairly good except for recent intercurrent medical issues  She takes 112 g of brand name Synthroid and she is taking this every day consistently  She prefers the brand name supplement but still has not checked on the mail order program that was given to her  Vitamin D deficiency: She is not taking her supplement regularly and recommending taking 4000 units daily She will need to continue this long-term     Treatment:  She will continue brand name 112 g Synthroid with one tablet daily before breakfast To check into the Synthroid direct program She will call if she starts having any unusual fatigue   Follow-up in 6 months again with repeat labs    [facility] 11/29/2016, 8:32 AM

## 2016-11-30 ENCOUNTER — Encounter: Payer: Self-pay | Admitting: Internal Medicine

## 2016-12-05 ENCOUNTER — Ambulatory Visit: Payer: PPO | Admitting: Podiatry

## 2016-12-06 ENCOUNTER — Ambulatory Visit (AMBULATORY_SURGERY_CENTER): Payer: PPO | Admitting: Internal Medicine

## 2016-12-06 ENCOUNTER — Encounter: Payer: Self-pay | Admitting: Internal Medicine

## 2016-12-06 VITALS — BP 145/82 | HR 59 | Temp 97.5°F | Resp 16 | Ht 65.5 in | Wt 181.0 lb

## 2016-12-06 DIAGNOSIS — Z1212 Encounter for screening for malignant neoplasm of rectum: Secondary | ICD-10-CM | POA: Diagnosis not present

## 2016-12-06 DIAGNOSIS — Z1211 Encounter for screening for malignant neoplasm of colon: Secondary | ICD-10-CM | POA: Diagnosis not present

## 2016-12-06 MED ORDER — SODIUM CHLORIDE 0.9 % IV SOLN: 500.0000 mL | INTRAVENOUS | Status: DC

## 2016-12-06 NOTE — Patient Instructions (Addendum)
No polyps or cancer detected.  You do have diverticulosis - thickened muscle rings and pouches in the colon wall. Please read the handout about this condition.  I appreciate the opportunity to care for you. Gatha Mayer, MD, FACG YOU HAD AN ENDOSCOPIC PROCEDURE TODAY AT [facility]:   Refer to the procedure report that was given to you for any specific questions about what was found during the examination.  If the procedure report does not answer your questions, please call your gastroenterologist to clarify.  If you requested that your care partner not be given the details of your procedure findings, then the procedure report has been included in a sealed envelope for you to review at your convenience later.  YOU SHOULD EXPECT: Some feelings of bloating in the abdomen. Passage of more gas than usual.  Walking can help get rid of the air that was put into your GI tract during the procedure and reduce the bloating. If you had a lower endoscopy (such as a colonoscopy or flexible sigmoidoscopy) you may notice spotting of blood in your stool or on the toilet paper. If you underwent a bowel prep for your procedure, you may not have a normal bowel movement for a few days.  Please Note:  You might notice some irritation and congestion in your nose or some drainage.  This is from the oxygen used during your procedure.  There is no need for concern and it should clear up in a day or so.  SYMPTOMS TO REPORT IMMEDIATELY:   Following lower endoscopy (colonoscopy or flexible sigmoidoscopy):  Excessive amounts of blood in the stool  Significant tenderness or worsening of abdominal pains  Swelling of the abdomen that is new, acute  Fever of 100F or higher  For urgent or emergent issues, a gastroenterologist can be reached at any hour by calling [phone].   DIET:  We do recommend a small meal at first, but then you may proceed to your regular diet.  Drink plenty of fluids  but you should avoid alcoholic beverages for 24 hours.  ACTIVITY:  You should plan to take it easy for the rest of today and you should NOT DRIVE or use heavy machinery until tomorrow (because of the sedation medicines used during the test).    FOLLOW UP: Our staff will call the number listed on your records the next business day following your procedure to check on you and address any questions or concerns that you may have regarding the information given to you following your procedure. If we do not reach you, we will leave a message.  However, if you are feeling well and you are not experiencing any problems, there is no need to return our call.  We will assume that you have returned to your regular daily activities without incident.  If any biopsies were taken you will be contacted by phone or by letter within the next 1-3 weeks.  Please call us at [phone] if you have not heard about the biopsies in 3 weeks.   Diverticulosis (handout given) Consider repeat Colonoscopy vs other appropriate test in 10 years for screening purposes.  SIGNATURES/CONFIDENTIALITY: You and/or your care partner have signed paperwork which will be entered into your electronic medical record.  These signatures attest to the fact that that the information above on your After Visit Summary has been reviewed and is understood.  Full responsibility of the confidentiality of this discharge information lies with you and/or your  care-partner.

## 2016-12-06 NOTE — Op Note (Signed)
[facility] Patient Name: Melissa Cox Procedure Date: 12/06/2016 10:31 AM MRN: [id number] Endoscopist: Gatha Mayer , MD Age: 66 Referring MD:  Date of Birth: [birth year] Gender: Female Account #: [id number] Procedure:                Colonoscopy Indications:              Screening for colorectal malignant neoplasm Medicines:                Propofol per Anesthesia, Monitored Anesthesia Care Procedure:                Pre-Anesthesia Assessment:                           - Prior to the procedure, a History and Physical                            was performed, and patient medications and                            allergies were reviewed. The patient's tolerance of                            previous anesthesia was also reviewed. The risks                            and benefits of the procedure and the sedation                            options and risks were discussed with the patient.                            All questions were answered, and informed consent                            was obtained. Prior Anticoagulants: The patient has                            taken no previous anticoagulant or antiplatelet                            agents. ASA Grade Assessment: II - A patient with                            mild systemic disease. After reviewing the risks                            and benefits, the patient was deemed in                            satisfactory condition to undergo the procedure.                           After obtaining informed consent, the colonoscope  was passed under direct vision. Throughout the                            procedure, the patient's blood pressure, pulse, and                            oxygen saturations were monitored continuously. The                            Model CF-HQ190L [id number]) scope was introduced                            through the anus and advanced to the the cecum,          identified by appendiceal orifice and ileocecal                            valve. The ileocecal valve, appendiceal orifice,                            and rectum were photographed. The quality of the                            bowel preparation was good. The bowel preparation                            used was Miralax. Scope In: 10:35:30 AM Scope Out: 10:50:18 AM Scope Withdrawal Time: 0 hours 8 minutes 53 seconds  Total Procedure Duration: 0 hours 14 minutes 48 seconds  Findings:                 The perianal and digital rectal examinations were                            normal.                           Many small and large-mouthed diverticula were found                            in the sigmoid colon. There was narrowing of the                            colon in association with the diverticular opening.                           The exam was otherwise without abnormality on                            direct and retroflexion views. Complications:            No immediate complications. Estimated blood loss:                            None. Estimated Blood Loss:     Estimated blood loss: none. Recommendation:           -  Consider repeat colonoscopy vs other appropriate                            test in 10 years for screening purposes.                           - Resume previous diet.                           - Continue present medications. Gatha Mayer, MD 12/06/2016 10:55:51 AM This report has been signed electronically.

## 2016-12-06 NOTE — Progress Notes (Signed)
Spontaneous respirations throughout. VSS. Resting comfortably. To PACU on room air. Report to  RN. 

## 2016-12-06 NOTE — Progress Notes (Signed)
Pt's states no medical or surgical changes since previsit or office visit. 

## 2016-12-07 ENCOUNTER — Encounter: Payer: Self-pay | Admitting: Family Medicine

## 2016-12-07 ENCOUNTER — Telehealth: Payer: Self-pay

## 2016-12-07 ENCOUNTER — Ambulatory Visit (INDEPENDENT_AMBULATORY_CARE_PROVIDER_SITE_OTHER): Payer: PPO | Admitting: Family Medicine

## 2016-12-07 DIAGNOSIS — I889 Nonspecific lymphadenitis, unspecified: Secondary | ICD-10-CM

## 2016-12-07 MED ORDER — AMOXICILLIN-POT CLAVULANATE 875-125 MG PO TABS: 1.0000 | ORAL_TABLET | Freq: Two times a day (BID) | ORAL | 0 refills | Status: DC

## 2016-12-07 NOTE — Patient Instructions (Signed)
Restart the augmentin and let me talk to [place].  Take care.  Glad to see you.

## 2016-12-07 NOTE — Progress Notes (Signed)
She had colonoscopy yesterday.  Diverticulosis noted.   She had pain with BM today, before during and then after the BM.  The pain got better and no pain now.  No blood in stool.    Neck mass, bilaterally.  She got home from the colonoscopy and noted a bump on the L side of the neck.  More tender locally now.  Now with similar spot on the R side also.  No fevers, no chills.  She doesn't feel sick except for some occ L ear pressure.  No stiff neck.  Able to swallow well.    No tooth pain now.    Meds, vitals, and allergies reviewed.   ROS: Per HPI unless specifically indicated in ROS section   GEN: nad, alert and oriented HEENT: mucous membranes moist, TM wnl, nasal and OP exam wnl, B LA near the ankle of the jaw.   Parotids not ttp NECK: supple w/o other LA CV: rrr. PULM: ctab, no inc wob ABD: soft, +bs EXT: no edema SKIN: no acute rash

## 2016-12-07 NOTE — Telephone Encounter (Signed)
  Follow up Call-  Call Jeanee Fabre number 12/06/2016  Post procedure Call Fionnuala Hemmerich phone  # [phone]  Permission to leave phone message Yes  Some recent data might be hidden     Patient questions:  Do you have a fever, pain , or abdominal swelling? No. Pain Score  0 *  Have you tolerated food without any problems? Yes.    Have you been able to return to your normal activities? Yes.    Do you have any questions about your discharge instructions: Diet   No. Medications  No. Follow up visit  No.  Do you have questions or concerns about your Care? Yes.    Actions: * If pain score is 4 or above: No action needed, pain <4.  Patient reports small lump below left ear and jaw line. Patient states she feels okay, has been tolerating food, and feeling like her normal self. I instructed her that oxygen tubing is applied during every procedure, and that could be the cause of her discomfort in that area. Instructed her to contact PCP if discomfort worsens, and/or she starts feeling ill.

## 2016-12-08 DIAGNOSIS — I889 Nonspecific lymphadenitis, unspecified: Secondary | ICD-10-CM | POA: Insufficient documentation

## 2016-12-08 NOTE — Assessment & Plan Note (Signed)
She has had this happen before. She has responded Augmentin. Unclear cause. Nontoxic. Okay for outpatient follow-up. I doubt this is related to the procedure yesterday. Given that it is recurrent, likely reasonable to go ahead and treat her. Start Augmentin. I want her to update [place] if worsening or if other symptoms develop. She agrees. Routed to PCP as FYI.

## 2016-12-12 ENCOUNTER — Ambulatory Visit (INDEPENDENT_AMBULATORY_CARE_PROVIDER_SITE_OTHER): Payer: PPO | Admitting: Podiatry

## 2016-12-12 ENCOUNTER — Encounter: Payer: Self-pay | Admitting: Podiatry

## 2016-12-12 DIAGNOSIS — M722 Plantar fascial fibromatosis: Secondary | ICD-10-CM

## 2016-12-12 NOTE — Progress Notes (Signed)
She presents today for follow-up of plantar fasciitis of the right foot. States that she was not taking her meloxicam as she was instructed. She was told this by her primary care provider. She continues well of her braces and supports. Purchased a  Shoes.  Objective: Vital signs are stable she is alert and oriented 3. Pulses are palpable. She has pain on palpation medial calcaneal tubercle of the right heel. Much decreased from previous evaluation.  Assessment: Residual plantar fasciitis.  Plan: Injected the right heel today with Kenalog and local anesthetic recommended that she continue all conservative therapy a fresh brace night splint and stretching exercises. She will continue her shoe gear change.

## 2017-01-11 ENCOUNTER — Ambulatory Visit: Payer: PPO | Admitting: Podiatry

## 2017-03-06 ENCOUNTER — Ambulatory Visit: Payer: PPO

## 2017-03-09 ENCOUNTER — Ambulatory Visit (INDEPENDENT_AMBULATORY_CARE_PROVIDER_SITE_OTHER): Payer: PPO

## 2017-03-09 VITALS — BP 150/90 | HR 68 | Temp 98.5°F | Ht 64.0 in | Wt 179.5 lb

## 2017-03-09 DIAGNOSIS — E782 Mixed hyperlipidemia: Secondary | ICD-10-CM

## 2017-03-09 DIAGNOSIS — Z1159 Encounter for screening for other viral diseases: Secondary | ICD-10-CM | POA: Diagnosis not present

## 2017-03-09 DIAGNOSIS — R7989 Other specified abnormal findings of blood chemistry: Secondary | ICD-10-CM | POA: Diagnosis not present

## 2017-03-09 DIAGNOSIS — Z0000 Encounter for general adult medical examination without abnormal findings: Secondary | ICD-10-CM | POA: Diagnosis not present

## 2017-03-09 DIAGNOSIS — Z9189 Other specified personal risk factors, not elsewhere classified: Secondary | ICD-10-CM | POA: Diagnosis not present

## 2017-03-09 NOTE — Progress Notes (Signed)
I reviewed health advisor's note, was available for consultation, and agree with documentation and plan.  

## 2017-03-09 NOTE — Progress Notes (Signed)
PCP notes:   Health maintenance:  Bone density - PCP please address at next appt Hep C screening - completed  Abnormal screenings:   Fall risk - hx of fall with injury and medical treatment  Hearing - failed  Hearing Screening   125Hz  250Hz  500Hz  1000Hz  2000Hz  3000Hz  4000Hz  6000Hz  8000Hz   Right ear:   0 0 40  0    Left ear:   0 0 40  0      Patient concerns:   None  Nurse concerns:  Patient's BP is elevated. 150/90.   Next PCP appt:   03/13/17 @ 0830

## 2017-03-09 NOTE — Patient Instructions (Signed)
Ms. Drummer , Thank you for taking time to come for your Medicare Wellness Visit. I appreciate your ongoing commitment to your health goals. Please review the following plan we discussed and let me know if I can assist you in the future.   These are the goals we discussed: Goals    . DIET - INCREASE WATER INTAKE     Starting 03/09/2017, I will attempt to drink at least 6-8 glasses of water.       This is a list of the screening recommended for you and due dates:  Health Maintenance  Topic Date Due  . DEXA scan (bone density measurement)  03/27/2018*  . Pneumonia vaccines (2 of 2 - PPSV23) 06/09/2017  . Mammogram  02/01/2018  . Cologuard (Stool DNA test)  05/12/2019  . Tetanus Vaccine  03/03/2026  . Flu Shot  Completed  .  Hepatitis C: One time screening is recommended by Center for Disease Control  (CDC) for  adults born from [birth year] through 1965.   Completed  *Topic was postponed. The date shown is not the original due date.   Preventive Care for Adults  A healthy lifestyle and preventive care can promote health and wellness. Preventive health guidelines for adults include the following key practices.  . A routine yearly physical is a good way to check with your health care provider about your health and preventive screening. It is a chance to share any concerns and updates on your health and to receive a thorough exam.  . Visit your dentist for a routine exam and preventive care every 6 months. Brush your teeth twice a day and floss once a day. Good oral hygiene prevents tooth decay and gum disease.  . The frequency of eye exams is based on your age, health, family medical history, use  of contact lenses, and other factors. Follow your health care provider's recommendations for frequency of eye exams.  . Eat a healthy diet. Foods like vegetables, fruits, whole grains, low-fat dairy products, and lean protein foods contain the nutrients you need without too many calories. Decrease  your intake of foods high in solid fats, added sugars, and salt. Eat the right amount of calories for you. Get information about a proper diet from your health care provider, if necessary.  . Regular physical exercise is one of the most important things you can do for your health. Most adults should get at least 150 minutes of moderate-intensity exercise (any activity that increases your heart rate and causes you to sweat) each week. In addition, most adults need muscle-strengthening exercises on 2 or more days a week.  Silver Sneakers may be a benefit available to you. To determine eligibility, you may visit the website: www.silversneakers.com or contact program at 224-655-8651 Mon-Fri between 8AM-8PM.   . Maintain a healthy weight. The body mass index (BMI) is a screening tool to identify possible weight problems. It provides an estimate of body fat based on height and weight. Your health care provider can find your BMI and can help you achieve or maintain a healthy weight.   For adults 20 years and older: ? A BMI below 18.5 is considered underweight. ? A BMI of 18.5 to 24.9 is normal. ? A BMI of 25 to 29.9 is considered overweight. ? A BMI of 30 and above is considered obese.   . Maintain normal blood lipids and cholesterol levels by exercising and minimizing your intake of saturated fat. Eat a balanced diet with plenty of fruit and  vegetables. Blood tests for lipids and cholesterol should begin at age 13 and be repeated every 5 years. If your lipid or cholesterol levels are high, you are over 50, or you are at high risk for heart disease, you may need your cholesterol levels checked more frequently. Ongoing high lipid and cholesterol levels should be treated with medicines if diet and exercise are not working.  . If you smoke, find out from your health care provider how to quit. If you do not use tobacco, please do not start.  . If you choose to drink alcohol, please do not consume more than  2 drinks per day. One drink is considered to be 12 ounces (355 mL) of beer, 5 ounces (148 mL) of wine, or 1.5 ounces (44 mL) of liquor.  . If you are 54-1 years old, ask your health care provider if you should take aspirin to prevent strokes.  . Use sunscreen. Apply sunscreen liberally and repeatedly throughout the day. You should seek shade when your shadow is shorter than you. Protect yourself by wearing long sleeves, pants, a wide-brimmed hat, and sunglasses year round, whenever you are outdoors.  . Once a month, do a whole body skin exam, using a mirror to look at the skin on your back. Tell your health care provider of new moles, moles that have irregular borders, moles that are larger than a pencil eraser, or moles that have changed in shape or color.

## 2017-03-09 NOTE — Progress Notes (Signed)
Subjective:   Melissa Cox is a 66 y.o. female who presents for an Initial Medicare Annual Wellness Visit.  Review of Systems    N/A  Cardiac Risk Factors include: advanced age (>67men, >32 women);dyslipidemia;obesity (BMI >30kg/m2)     Objective:    Today's Vitals   03/09/17 1455  BP: (!) 150/90  Pulse: 68  Temp: 98.5 F (36.9 C)  TempSrc: Oral  SpO2: 97%  Weight: 179 lb 8 oz (81.4 kg)  Height: 5\' 4"  (1.626 m)  PainSc: 0-No pain   Body mass index is 30.81 kg/m.  Advanced Directives 03/09/2017 11/29/2016  Does Patient Have a Medical Advance Directive? Yes Yes  Type of Paramedic of [place];Living will [place];Living will  Copy of [place] in Chart? No - copy requested -    Current Medications (verified) Outpatient Encounter Medications as of 03/09/2017  Medication Sig  . clobetasol cream (TEMOVATE) 3.81 % Apply 1 application topically as needed.   Marland Kitchen SYNTHROID 112 MCG tablet TAKE 1 TABLET (112 MCG TOTAL) BY MOUTH DAILY BEFORE BREAKFAST.  . [DISCONTINUED] amoxicillin-clavulanate (AUGMENTIN) 875-125 MG tablet Take 1 tablet by mouth 2 (two) times daily.  . Cholecalciferol (VITAMIN D-3) 5000 units TABS Take 1 tablet by mouth daily.  . meloxicam (MOBIC) 15 MG tablet Take 1 tablet (15 mg total) by mouth daily. (Patient not taking: Reported on 03/09/2017)   No facility-administered encounter medications on file as of 03/09/2017.     Allergies (verified) Demerol [meperidine]; Diphenhydramine hcl; Epinephrine; Meperidine hcl; and Prochlorperazine edisylate   History: Past Medical History:  Diagnosis Date  . Anemia    years ago - had hysterectomy to stop - hgb 5.2  . Arthritis    hands  . Blood transfusion without reported diagnosis    prior to hysterectomy  . Cataract    beginnings   . Diverticulitis 2018  . GERD (gastroesophageal reflux disease)    occ -uses TUMS  . Lichen sclerosus   .  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 Unspecified essential hypertension 10/21/2008   Past Surgical History:  Procedure Laterality Date  . ABDOMINAL HYSTERECTOMY  8299,3716   partial, fibroids  . BREAST LUMPECTOMY     right,benign  . MENISCUS REPAIR Left    Family History  Problem Relation Age of Onset  . Goiter Mother   . Heart disease Father   . Hypothyroidism Sister   . Stomach cancer Sister   . Colon cancer Maternal Grandmother   . Colon polyps Neg Hx   . Esophageal cancer Neg Hx   . Rectal cancer Neg Hx    Social History   Socioeconomic History  . Marital status: Married    Spouse name: None  . Number of children: None  . Years of education: None  . Highest education level: None  Social Needs  . Financial resource strain: None  . Food insecurity - worry: None  . Food insecurity - inability: None  . Transportation needs - medical: None  . Transportation needs - non-medical: None  Occupational History  . None  Tobacco Use  . Smoking status: Never Smoker  . Smokeless tobacco: Never Used  Substance and Sexual Activity  . Alcohol use: Yes    Comment: occ glass of wine  . Drug use: No  . Sexual activity: None  Other Topics Concern  . None  Social History Narrative  . None  Tobacco Counseling Counseling given: No   Clinical Intake:  Pre-visit preparation completed: Yes  Pain : No/denies pain Pain Score: 0-No pain     Nutritional Status: BMI 25 -29 Overweight Nutritional Risks: None Diabetes: No  How often do you need to have someone help you when you read instructions, pamphlets, or other written materials from your doctor or pharmacy?: 1 - Never What is the last grade level you completed in school?: 12th grade + 2 yrs technical college  Interpreter Needed?: No  Comments: pt lives with spouse Information entered by :: LPinson, LPN   Activities of Daily Living In your  present state of health, do you have any difficulty performing the following activities: 03/09/2017  Hearing? N  Vision? N  Difficulty concentrating or making decisions? N  Walking or climbing stairs? N  Dressing or bathing? N  Doing errands, shopping? N  Preparing Food and eating ? N  Using the Toilet? N  In the past six months, have you accidently leaked urine? Y  Comment sneezing may cause leakage  Do you have problems with loss of bowel control? N  Managing your Medications? N  Managing your Finances? N  Housekeeping or managing your Housekeeping? N  Some recent data might be hidden     Immunizations and Health Maintenance Immunization History  Administered Date(s) Administered  . Hepatitis A 10/30/2006  . Hepatitis B 05/25/2006, 10/30/2006, 10/21/2008  . Influenza Split 01/26/2012  . Influenza, High Dose Seasonal PF 01/28/2016, 01/17/2017  . Influenza,inj,Quad PF,6+ Mos 02/07/2014, 03/19/2015  . Pneumococcal Conjugate-13 06/09/2016  . Td 02/21/2006  . Tdap 03/03/2016   There are no preventive care reminders to display for this patient.  Patient Care Team: Owens Loffler, MD as PCP - General (Family Medicine)  Indicate any recent Medical Services you may have received from other than [facility] providers in the past year (date may be approximate).     Assessment:   This is a routine wellness examination for Melissa Cox.  Hearing/Vision screen  Hearing Screening   125Hz  250Hz  500Hz  1000Hz  2000Hz  3000Hz  4000Hz  6000Hz  8000Hz   Right ear:   0 0 40  0    Left ear:   0 0 40  0      Visual Acuity Screening   Right eye Left eye Both eyes  Without correction: 20/25 20/25 20/20-1  With correction:       Dietary issues and exercise activities discussed: Current Exercise Habits: The patient does not participate in regular exercise at present, Exercise limited by: None identified  Goals    . DIET - INCREASE WATER INTAKE     Starting 03/09/2017, I will attempt to drink at  least 6-8 glasses of water.      Depression Screen PHQ 2/9 Scores 03/09/2017 10/05/2016  PHQ - 2 Score 0 0  PHQ- 9 Score 0 -    Fall Risk Fall Risk  03/09/2017 10/05/2016  Falls in the past year? Yes Yes  Number falls in past yr: 1 1  Injury with Fall? Yes Yes    Cognitive Function: MMSE - Mini Mental State Exam 03/09/2017  Orientation to time 5  Orientation to Place 5  Registration 3  Attention/ Calculation 0  Recall 3  Language- name 2 objects 0  Language- repeat 1  Language- follow 3 step command 3  Language- read & follow direction 0  Write a sentence 0  Copy design 0  Total score 20     PLEASE NOTE: A Mini-Cog screen was completed.  Maximum score is 20. A value of 0 denotes this part of [place] MMSE was not completed or the patient failed this part of the Mini-Cog screening.   Mini-Cog Screening Orientation to Time - Max 5 pts Orientation to Place - Max 5 pts Registration - Max 3 pts Recall - Max 3 pts Language Repeat - Max 1 pts Language Follow 3 Step Command - Max 3 pts    Screening Tests Health Maintenance  Topic Date Due  . DEXA SCAN  03/27/2018 (Originally 04/02/2015)  . PNA vac Low Risk Adult (2 of 2 - PPSV23) 06/09/2017  . MAMMOGRAM  02/01/2018  . Fecal DNA (Cologuard)  05/12/2019  . TETANUS/TDAP  03/03/2026  . INFLUENZA VACCINE  Completed  . Hepatitis C Screening  Completed      Plan:     I have personally reviewed, addressed, and noted the following in the patient's chart:  A. Medical and social history B. Use of alcohol, tobacco or illicit drugs  C. Current medications and supplements D. Functional ability and status E.  Nutritional status F.  Physical activity G. Advance directives H. List of other physicians I.  Hospitalizations, surgeries, and ER visits in previous 12 months J.  [place] to include hearing, vision, cognitive, depression L. Referrals and appointments - none  In addition, I have reviewed and discussed  with patient certain preventive protocols, quality metrics, and best practice recommendations. A written personalized care plan for preventive services as well as general preventive health recommendations were provided to patient.  See attached scanned questionnaire for additional information.   Signed,   Lindell Noe, MHA, BS, LPN Health Coach

## 2017-03-09 NOTE — Progress Notes (Signed)
Pre visit review using our clinic review tool, if applicable. No additional management support is needed unless otherwise documented below in the visit note. 

## 2017-03-10 LAB — LIPID PANEL
CHOL/HDL RATIO: 4
Cholesterol: 216 mg/dL — ABNORMAL HIGH (ref 0–200)
HDL: 52.2 mg/dL (ref >39.00)
LDL Cholesterol: 140 mg/dL — ABNORMAL HIGH (ref 0–99)
NONHDL: 163.37
Triglycerides: 118 mg/dL (ref 0.0–149.0)
VLDL: 23.6 mg/dL (ref 0.0–40.0)

## 2017-03-10 LAB — HEPATITIS C ANTIBODY
Hepatitis C Ab: NONREACTIVE
SIGNAL TO CUT-OFF: 0.02 (ref <1.00)

## 2017-03-10 LAB — T3, FREE: T3, Free: 3.4 pg/mL (ref 2.3–4.2)

## 2017-03-13 ENCOUNTER — Encounter: Payer: Self-pay | Admitting: Family Medicine

## 2017-03-13 ENCOUNTER — Ambulatory Visit (INDEPENDENT_AMBULATORY_CARE_PROVIDER_SITE_OTHER): Payer: PPO | Admitting: Family Medicine

## 2017-03-13 VITALS — BP 138/88 | HR 69 | Temp 97.8°F | Wt 182.0 lb

## 2017-03-13 DIAGNOSIS — R590 Localized enlarged lymph nodes: Secondary | ICD-10-CM | POA: Diagnosis not present

## 2017-03-13 DIAGNOSIS — Z0000 Encounter for general adult medical examination without abnormal findings: Secondary | ICD-10-CM

## 2017-03-13 NOTE — Progress Notes (Signed)
Dr. Frederico Hamman T. Crimson Dubberly, MD, [facility] [place], [address] Phone: [phone] Fax: [phone]  03/13/2017  Patient: Melissa Cox, MRN: [id number], DOB: 08/12/1950, 66 y.o.  Primary Physician:  Owens Loffler, MD   Chief Complaint  Patient presents with  . Annual Exam    Medicare Wellness Part 2. Saw Katha Cabal 03-09-17  . Knot on jaw line    Has a knot below left ear on jaw line that has been present for awhile.  . Pain    Intermittent sharp pain behind left eye and temple   Subjective:   Melissa Cox is a 66 y.o. pleasant patient who presents with the following:  Health Maintenance Summary Reviewed and updated, unless pt declines services.  Tobacco History Reviewed. Non-smoker Alcohol: No concerns, no excessive use Exercise Habits: Some activity, rec at least 30 mins 5 times a week STD concerns: none Drug Use: None Birth control method: hyst Lumps or breast concerns: no Breast Cancer Family History: no  Lymph node, L neck and some pain behind the L L eye. Sudden pain that will hurt for a few days and go away.   ? 3-4 years enlarged lymph nodes on the L at corner of the jaw.   Immunization History  Administered Date(s) Administered  . Hepatitis A 10/30/2006  . Hepatitis B 05/25/2006, 10/30/2006, 10/21/2008  . Influenza Split 01/26/2012  . Influenza, High Dose Seasonal PF 01/28/2016, 01/17/2017  . Influenza,inj,Quad PF,6+ Mos 02/07/2014, 03/19/2015  . Pneumococcal Conjugate-13 06/09/2016  . Td 02/21/2006  . Tdap 03/03/2016    BP Readings from Last 3 Encounters:  03/13/17 138/88  03/09/17 (!) 150/90  12/07/16 122/80    Wt Readings from Last 3 Encounters:  03/13/17 182 lb (82.6 kg)  03/09/17 179 lb 8 oz (81.4 kg)  12/07/16 178 lb 8 oz (81 kg)    Health Maintenance  Topic Date Due  . DEXA SCAN  03/27/2018 (Originally 04/02/2015)  . PNA vac Low Risk Adult (2 of 2 - PPSV23) 06/09/2017  .  MAMMOGRAM  02/01/2018  . Fecal DNA (Cologuard)  05/12/2019  . TETANUS/TDAP  03/03/2026  . INFLUENZA VACCINE  Completed  . Hepatitis C Screening  Completed    Immunization History  Administered Date(s) Administered  . Hepatitis A 10/30/2006  . Hepatitis B 05/25/2006, 10/30/2006, 10/21/2008  . Influenza Split 01/26/2012  . Influenza, High Dose Seasonal PF 01/28/2016, 01/17/2017  . Influenza,inj,Quad PF,6+ Mos 02/07/2014, 03/19/2015  . Pneumococcal Conjugate-13 06/09/2016  . Td 02/21/2006  . Tdap 03/03/2016   Patient Active Problem List   Diagnosis Date Noted  . Lymphadenitis 12/08/2016  . Pain of right heel 10/14/2016  . 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 Unspecified vitamin D deficiency 05/17/2013   Past Medical History:  Diagnosis Date  . Anemia    years ago - had hysterectomy to stop - hgb 5.2  . Arthritis    hands  . Blood transfusion without reported diagnosis    prior to hysterectomy  . Cataract    beginnings   . Diverticulitis 2018  . GERD (gastroesophageal reflux disease)    occ -uses TUMS  . Lichen sclerosus   . 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 Unspecified essential hypertension 10/21/2008   Past Surgical History:  Procedure Laterality Date  . ABDOMINAL HYSTERECTOMY  3818,2993   partial, fibroids  . BREAST LUMPECTOMY  right,benign  . MENISCUS REPAIR Left    Social History   Socioeconomic History  . Marital status: Married    Spouse name: Not on file  . Number of children: Not on file  . Years of education: Not on file  . Highest education level: Not on file  Social Needs  . Financial resource strain: Not on file  . Food insecurity - worry: Not on file  . Food insecurity - inability: Not on file  . Transportation needs - medical: Not on file  . Transportation needs - non-medical: Not on file  Occupational History  . Not on file  Tobacco Use  .  Smoking status: Never Smoker  . Smokeless tobacco: Never Used  Substance and Sexual Activity  . Alcohol use: Yes    Comment: occ glass of wine  . Drug use: No  . Sexual activity: Not on file  Other Topics Concern  . Not on file  Social History Narrative  . Not on file   Family History  Problem Relation Age of Onset  . Goiter Mother   . Heart disease Father   . Hypothyroidism Sister   . Stomach cancer Sister   . Colon cancer Maternal Grandmother   . Colon polyps Neg Hx   . Esophageal cancer Neg Hx   . Rectal cancer Neg Hx    Allergies  Allergen Reactions  . Demerol [Meperidine] Rash  . Diphenhydramine Hcl     REACTION: abd cramps and diarrhea  . Epinephrine Other (See Comments)    Increased heart rate   . Meperidine Hcl     REACTION: rash  . Prochlorperazine Edisylate     REACTION: increased heart rate- compazine     Medication list has been reviewed and updated.   General: Denies fever, chills, sweats. No significant weight loss. Eyes: Denies blurring,significant itching ENT: Denies earache, sore throat, and hoarseness.  Cardiovascular: Denies chest pains, palpitations, dyspnea on exertion,  Respiratory: Denies cough, dyspnea at rest,wheeezing Breast: no concerns about lumps GI: Denies nausea, vomiting, diarrhea, constipation, change in bowel habits, abdominal pain, melena, hematochezia GU: Denies dysuria, hematuria, urinary hesitancy, nocturia, denies STD risk, no concerns about discharge Musculoskeletal: Denies back pain, joint pain Derm: Denies rash, itching Neuro: Denies  paresthesias, frequent falls, frequent headaches Psych: Denies depression, anxiety Endocrine: Denies cold intolerance, heat intolerance, polydipsia Heme: Denies enlarged lymph nodes Allergy: No hayfever  Objective:   BP 138/88 (BP Location: Left Arm, Patient Position: Sitting, Cuff Size: Large)   Pulse 69   Temp 97.8 F (36.6 C) (Oral)   Wt 182 lb (82.6 kg)   SpO2 98%   BMI 31.24  kg/m  No exam data present  GEN: well developed, well nourished, no acute distress Eyes: conjunctiva and lids normal, PERRLA, EOMI ENT: TM clear, nares clear, oral exam WNL Neck: supple, AT THE ANGLE OF THE JAW THERE IS A LYMPH NODE > 1 CM IN SIZE, MOBILE, no thyromegaly, no JVD Pulm: clear to auscultation and percussion, respiratory effort normal CV: regular rate and rhythm, S1-S2, no murmur, rub or gallop, no bruits Chest: no scars, masses, no lumps BREAST: breast exam declined GI: soft, non-tender; no hepatosplenomegaly, masses; active bowel sounds all quadrants GU: GU exam declined Lymph: no cervical, axillary or inguinal adenopathy MSK: gait normal, muscle tone and strength WNL, no joint swelling, effusions, discoloration, crepitus  SKIN: clear, good turgor, color WNL, no rashes, lesions, or ulcerations Neuro: normal mental status, normal strength, sensation, and motion Psych: alert; oriented to  person, place and time, normally interactive and not anxious or depressed in appearance.   All labs reviewed with patient. Lipids:    Component Value Date/Time   CHOL 216 (H) 03/09/2017 1538   TRIG 118.0 03/09/2017 1538   HDL 52.20 03/09/2017 1538   VLDL 23.6 03/09/2017 1538   CHOLHDL 4 03/09/2017 1538   CBC: CBC Latest Ref Rng & Units 10/27/2016 10/14/2016 02/25/2016  WBC 4.0 - 10.5 K/uL 9.5 8.2 10.3  Hemoglobin 12.0 - 15.0 g/dL 14.0 14.4 14.5  Hematocrit 36.0 - 46.0 % 41.9 42.9 42.6  Platelets 150.0 - 400.0 K/uL 298.0 311.0 563.8    Basic Metabolic Panel:    Component Value Date/Time   NA 140 10/27/2016 1017   K 3.8 10/27/2016 1017   CL 104 10/27/2016 1017   CO2 28 10/27/2016 1017   BUN 16 10/27/2016 1017   CREATININE 0.77 10/27/2016 1017   GLUCOSE 105 (H) 10/27/2016 1017   CALCIUM 9.5 10/27/2016 1017   Hepatic Function Latest Ref Rng & Units 10/27/2016 10/14/2016 02/25/2016  Total Protein 6.0 - 8.3 g/dL 7.4 7.1 7.4  Albumin 3.5 - 5.2 g/dL 4.3 4.1 4.2  AST 0 - 37 U/L _0 ALT 0 - 35 U/L _1 Alk Phosphatase 39 - 117 U/L 66 66 73  Total Bilirubin 0.2 - 1.2 mg/dL 0.6 0.4 0.5  Bilirubin, Direct 0.0 - 0.3 mg/dL 0.1 0.1 0.0    Lab Results  Component Value Date   TSH 1.12 11/24/2016   No results found.  Assessment and Plan:   Healthcare maintenance  Enlarged lymph node in neck - Plan: Ambulatory referral to ENT  This is been treated twice with oral antibiotics, and persist.  It is been present according to the patient for at least 3 years.  I would like to get another opinion and have ENT look at it.  Health Maintenance Exam: The patient's preventative maintenance and recommended screening tests for an annual wellness exam were reviewed in full today. Brought up to date unless services declined.  Counselled on the importance of diet, exercise, and its role in overall health and mortality. The patient's FH and SH was reviewed, including their home life, tobacco status, and drug and alcohol status.  Follow-up in 1 year for physical exam or additional follow-up below.  Follow-up: No Follow-up on file. Or follow-up in 1 year if not noted.  Future Appointments  Date Time Provider Terminous  05/26/2017 10:00 AM [facility] None  05/29/2017 10:00 AM Elayne Snare, MD [facility] None   Orders Placed This Encounter  Procedures  . Ambulatory referral to ENT    Signed,  Frederico Hamman T. Altin Sease, MD   Allergies as of 03/13/2017      Reactions   Demerol [meperidine] Rash   Diphenhydramine Hcl    REACTION: abd cramps and diarrhea   Epinephrine Other (See Comments)   Increased heart rate    Meperidine Hcl    REACTION: rash   Prochlorperazine Edisylate    REACTION: increased heart rate- compazine       Medication List        Accurate as of 03/13/17 11:29 AM. Always use your most recent med list.          clobetasol cream 0.05 % Commonly known as:  TEMOVATE Apply 1 application topically as needed.   meloxicam  15 MG tablet Commonly known as:  MOBIC Take 1 tablet (15 mg total) by mouth daily.  SYNTHROID 112 MCG tablet Generic drug:  levothyroxine TAKE 1 TABLET (112 MCG TOTAL) BY MOUTH DAILY BEFORE BREAKFAST.   Vitamin D-3 5000 units Tabs Take 1 tablet by mouth daily.

## 2017-03-13 NOTE — Patient Instructions (Signed)
Get the new Shingrix shingles vaccines.

## 2017-04-12 ENCOUNTER — Other Ambulatory Visit: Payer: Self-pay | Admitting: Unknown Physician Specialty

## 2017-04-12 DIAGNOSIS — R221 Localized swelling, mass and lump, neck: Secondary | ICD-10-CM

## 2017-04-17 ENCOUNTER — Other Ambulatory Visit: Payer: Self-pay

## 2017-04-17 MED ORDER — MELOXICAM 15 MG PO TABS: 15.0000 mg | ORAL_TABLET | Freq: Every day | ORAL | 3 refills | Status: DC

## 2017-04-17 NOTE — Telephone Encounter (Signed)
Pharmacy refill request for Meloxicam.  Per Dr.Hyatt, ok to refill.  Script has been sent to pharmacy 

## 2017-04-24 ENCOUNTER — Ambulatory Visit
Admission: RE | Admit: 2017-04-24 | Discharge: 2017-04-24 | Disposition: A | Discharge status: Home / Self Care | Payer: PPO | Source: Ambulatory Visit | Attending: Unknown Physician Specialty | Admitting: Unknown Physician Specialty

## 2017-04-24 DIAGNOSIS — R221 Localized swelling, mass and lump, neck: Secondary | ICD-10-CM | POA: Insufficient documentation

## 2017-05-02 DIAGNOSIS — R221 Localized swelling, mass and lump, neck: Secondary | ICD-10-CM | POA: Diagnosis not present

## 2017-05-26 ENCOUNTER — Other Ambulatory Visit: Payer: PPO

## 2017-05-29 ENCOUNTER — Ambulatory Visit: Payer: PPO | Admitting: Endocrinology

## 2017-08-01 ENCOUNTER — Encounter: Payer: Self-pay | Admitting: Family Medicine

## 2017-08-01 ENCOUNTER — Ambulatory Visit (INDEPENDENT_AMBULATORY_CARE_PROVIDER_SITE_OTHER): Payer: PPO | Admitting: Family Medicine

## 2017-08-01 VITALS — BP 140/80 | HR 80 | Temp 98.9°F | Wt 183.1 lb

## 2017-08-01 DIAGNOSIS — J320 Chronic maxillary sinusitis: Secondary | ICD-10-CM | POA: Diagnosis not present

## 2017-08-01 MED ORDER — AMOXICILLIN-POT CLAVULANATE 875-125 MG PO TABS: 1.0000 | ORAL_TABLET | Freq: Two times a day (BID) | ORAL | 0 refills | Status: DC

## 2017-08-01 NOTE — Patient Instructions (Signed)
Leave off the hydrogen peroxide to mouth and use salt water rinses.

## 2017-08-01 NOTE — Progress Notes (Signed)
  Subjective:     Patient ID: Melissa Cox, female   DOB: [birth year], 68 y.o.   MRN: [id number]  HPI Patient seen with progressive left facial pain maxillary region. Onset last Thursday of some congestion and sore throat. She's had some postnasal drip. No purulent secretions. No cough. She's developed progressive left upper teeth pain. She had episode of chills but no documented fever.  Past Medical History:  Diagnosis Date  . Anemia    years ago - had hysterectomy to stop - hgb 5.2  . Arthritis    hands  . Blood transfusion without reported diagnosis    prior to hysterectomy  . Cataract    beginnings   . Diverticulitis 2018  . GERD (gastroesophageal reflux disease)    occ -uses TUMS  . Lichen sclerosus   . 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 Unspecified essential hypertension 10/21/2008   Past Surgical History:  Procedure Laterality Date  . ABDOMINAL HYSTERECTOMY  0102,7253   partial, fibroids  . BREAST LUMPECTOMY     right,benign  . MENISCUS REPAIR Left     reports that she has never smoked. She has never used smokeless tobacco. She reports that she drinks alcohol. She reports that she does not use drugs. family history includes Colon cancer in her maternal grandmother; Goiter in her mother; Heart disease in her father; Hypothyroidism in her sister; Stomach cancer in her sister. Allergies  Allergen Reactions  . Demerol [Meperidine] Rash  . Diphenhydramine Hcl     REACTION: abd cramps and diarrhea  . Epinephrine Other (See Comments)    Increased heart rate   . Meperidine Hcl     REACTION: rash  . Prochlorperazine Edisylate     REACTION: increased heart rate- compazine      Review of Systems  Constitutional: Positive for chills. Negative for fever.  HENT: Positive for congestion and sinus pain.   Respiratory: Negative for cough and shortness of breath.        Objective:   Physical Exam   Constitutional: She appears well-developed and well-nourished.  HENT:  Right Ear: Tympanic membrane normal.  Left Ear: Tympanic membrane normal.  Patient has superficial aphthous type ulcer left upper hard palate region.  Neck: Normal range of motion.  Cardiovascular: Normal rate.  Pulmonary/Chest: Effort normal and breath sounds normal. She has no wheezes. She has no rales.  Lymphadenopathy:    She has no cervical adenopathy.       Assessment:     Probable left maxillary sinusitis    Plan:     -Augmentin 875 mg twice daily for 10 days -Stay well-hydrated -Leave off hydrogen peroxide to oral lesion and try saltwater gargles/rinses  Eulas Post MD [facility]

## 2017-08-03 ENCOUNTER — Other Ambulatory Visit: Payer: Self-pay | Admitting: Endocrinology

## 2017-08-13 DIAGNOSIS — R112 Nausea with vomiting, unspecified: Secondary | ICD-10-CM | POA: Diagnosis not present

## 2017-08-13 DIAGNOSIS — R42 Dizziness and giddiness: Secondary | ICD-10-CM | POA: Diagnosis not present

## 2017-08-14 ENCOUNTER — Emergency Department (HOSPITAL_COMMUNITY): Payer: PPO

## 2017-08-14 ENCOUNTER — Emergency Department (HOSPITAL_COMMUNITY)
Admission: EM | Admit: 2017-08-14 | Discharge: 2017-08-14 | Disposition: A | Discharge status: Home / Self Care | Payer: PPO | Attending: Emergency Medicine | Admitting: Emergency Medicine

## 2017-08-14 ENCOUNTER — Other Ambulatory Visit: Payer: Self-pay

## 2017-08-14 ENCOUNTER — Encounter (HOSPITAL_COMMUNITY): Payer: Self-pay | Admitting: *Deleted

## 2017-08-14 DIAGNOSIS — I10 Essential (primary) hypertension: Secondary | ICD-10-CM | POA: Diagnosis not present

## 2017-08-14 DIAGNOSIS — R112 Nausea with vomiting, unspecified: Secondary | ICD-10-CM | POA: Diagnosis not present

## 2017-08-14 DIAGNOSIS — Z79899 Other long term (current) drug therapy: Secondary | ICD-10-CM | POA: Insufficient documentation

## 2017-08-14 DIAGNOSIS — R42 Dizziness and giddiness: Secondary | ICD-10-CM | POA: Diagnosis not present

## 2017-08-14 DIAGNOSIS — H81399 Other peripheral vertigo, unspecified ear: Secondary | ICD-10-CM | POA: Diagnosis not present

## 2017-08-14 LAB — BASIC METABOLIC PANEL
Anion gap: 12 (ref 5–15)
BUN: 13 mg/dL (ref 6–20)
CHLORIDE: 106 mmol/L (ref 101–111)
CO2: 21 mmol/L — AB (ref 22–32)
CREATININE: 0.71 mg/dL (ref 0.44–1.00)
Calcium: 9.1 mg/dL (ref 8.9–10.3)
GFR calc Af Amer: >60 mL/min (ref >60)
GFR calc non Af Amer: >60 mL/min (ref >60)
GLUCOSE: 160 mg/dL — AB (ref 65–99)
Potassium: 3.6 mmol/L (ref 3.5–5.1)
Sodium: 139 mmol/L (ref 135–145)

## 2017-08-14 LAB — CBC
HCT: 41.6 % (ref 36.0–46.0)
Hemoglobin: 13.6 g/dL (ref 12.0–15.0)
MCH: 28.5 pg (ref 26.0–34.0)
MCHC: 32.7 g/dL (ref 30.0–36.0)
MCV: 87 fL (ref 78.0–100.0)
PLATELETS: 279 10*3/uL (ref 150–400)
RBC: 4.78 MIL/uL (ref 3.87–5.11)
RDW: 13.6 % (ref 11.5–15.5)
WBC: 16.4 10*3/uL — ABNORMAL HIGH (ref 4.0–10.5)

## 2017-08-14 LAB — URINALYSIS, ROUTINE W REFLEX MICROSCOPIC
Bilirubin Urine: NEGATIVE
GLUCOSE, UA: 50 mg/dL — AB
HGB URINE DIPSTICK: NEGATIVE
KETONES UR: 5 mg/dL — AB
Leukocytes, UA: NEGATIVE
Nitrite: NEGATIVE
PH: 8 (ref 5.0–8.0)
PROTEIN: NEGATIVE mg/dL
Specific Gravity, Urine: 1.013 (ref 1.005–1.030)

## 2017-08-14 LAB — CBG MONITORING, ED: Glucose-Capillary: 160 mg/dL — ABNORMAL HIGH (ref 65–99)

## 2017-08-14 MED ORDER — MECLIZINE HCL 25 MG PO TABS: 25.0000 mg | ORAL_TABLET | Freq: Three times a day (TID) | ORAL | 0 refills | Status: DC | PRN

## 2017-08-14 MED ORDER — MECLIZINE HCL 25 MG PO TABS
12.5000 mg | ORAL_TABLET | Freq: Once | ORAL | Status: AC
  Administered 2017-08-14: 12.5 mg via ORAL
  Filled 2017-08-14: qty 1

## 2017-08-14 MED ORDER — SODIUM CHLORIDE 0.9 % IV BOLUS
500.0000 mL | Freq: Once | INTRAVENOUS | Status: AC
  Administered 2017-08-14: 500 mL via INTRAVENOUS

## 2017-08-14 MED ORDER — ONDANSETRON HCL 4 MG/2ML IJ SOLN
4.0000 mg | Freq: Once | INTRAMUSCULAR | Status: AC
  Administered 2017-08-14: 4 mg via INTRAVENOUS
  Filled 2017-08-14: qty 2

## 2017-08-14 MED ORDER — LORAZEPAM 2 MG/ML IJ SOLN
0.5000 mg | Freq: Once | INTRAMUSCULAR | Status: AC
  Administered 2017-08-14: 0.5 mg via INTRAVENOUS
  Filled 2017-08-14: qty 1

## 2017-08-14 MED ORDER — GADOBENATE DIMEGLUMINE 529 MG/ML IV SOLN
16.0000 mL | Freq: Once | INTRAVENOUS | Status: AC | PRN
  Administered 2017-08-14: 16 mL via INTRAVENOUS

## 2017-08-14 NOTE — ED Notes (Signed)
"  I still feel a little dizzy"

## 2017-08-14 NOTE — ED Notes (Signed)
Pt states that she does not feel like the room is spinning anymore but doesn't feel like she can stand up and walk and just wants to lay down. Upon sitting up she feels a little better but when looking at the RN and husband still felt a little dizzy. EDP made aware.

## 2017-08-14 NOTE — ED Provider Notes (Addendum)
[facility] DEPART[facility] Provider Note   CSN: [id number] Arrival date & time: 08/14/17  0110     History   Chief Complaint Chief Complaint  Patient presents with  . Dizziness    HPI Melissa Cox is a 67 y.o. female.  Patient presents to the emergency department for evaluation of dizziness.  Patient reports sudden onset of severe sense of spinning with nausea and vomiting.  She reports that she has had similar episodes in the past but usually it lasts only briefly and then resolves.  This has been continuous for several hours.  Patient denies any headache or blurred vision.  She has not noticed any unilateral weakness, numbness or tingling.     Past Medical History:  Diagnosis Date  . Anemia    years ago - had hysterectomy to stop - hgb 5.2  . Arthritis    hands  . Blood transfusion without reported diagnosis    prior to hysterectomy  . Cataract    beginnings   . Diverticulitis 2018  . GERD (gastroesophageal reflux disease)    occ -uses TUMS  . Lichen sclerosus   . 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 Unspecified essential hypertension 10/21/2008    Patient Active Problem List   Diagnosis Date Noted  . Lymphadenitis 12/08/2016  . Pain of right heel 10/14/2016  . 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 Unspecified vitamin D deficiency 05/17/2013    Past Surgical History:  Procedure Laterality Date  . ABDOMINAL HYSTERECTOMY  5188,4166   partial, fibroids  . BREAST LUMPECTOMY     right,benign  . MENISCUS REPAIR Left      OB History   None      Home Medications    Prior to Admission medications   Medication Sig Start Date End Date Taking? Authorizing Provider  SYNTHROID 112 MCG tablet TAKE 1 TABLET (112 MCG TOTAL) BY MOUTH DAILY BEFORE BREAKFAST. 08/04/16  Yes Elayne Snare, MD  amoxicillin-clavulanate (AUGMENTIN) 875-125 MG tablet Take 1  tablet by mouth 2 (two) times daily. Patient not taking: Reported on 08/14/2017 08/01/17   Eulas Post, MD  meclizine (ANTIVERT) 25 MG tablet Take 1 tablet (25 mg total) by mouth every 8 (eight) hours as needed for dizziness. 08/14/17   Orpah Greek, MD  meloxicam (MOBIC) 15 MG tablet Take 1 tablet (15 mg total) by mouth daily. Patient not taking: Reported on 08/14/2017 04/17/17   Garrel Ridgel, DPM    Family History Family History  Problem Relation Age of Onset  . Goiter Mother   . Heart disease Father   . Hypothyroidism Sister   . Stomach cancer Sister   . Colon cancer Maternal Grandmother   . Colon polyps Neg Hx   . Esophageal cancer Neg Hx   . Rectal cancer Neg Hx     Social History Social History   Tobacco Use  . Smoking status: Never Smoker  . Smokeless tobacco: Never Used  Substance Use Topics  . Alcohol use: Yes    Comment: occ glass of wine  . Drug use: No     Allergies   Demerol [meperidine]; Diphenhydramine hcl; Epinephrine; Meperidine hcl; and Prochlorperazine edisylate   Review of Systems Review of Systems  Gastrointestinal: Positive for nausea and vomiting.  Neurological: Positive for dizziness.  All other systems reviewed and are negative.    Physical Exam Updated Vital Signs BP (!) 149/108 (BP  Location: Left Arm)   Pulse 89   Temp 98 F (36.7 C) (Oral)   Resp 20   SpO2 95%   Physical Exam  Constitutional: She is oriented to person, place, and time. She appears well-developed and well-nourished. No distress.  HENT:  Head: Normocephalic and atraumatic.  Right Ear: Hearing normal.  Left Ear: Hearing normal.  Nose: Nose normal.  Mouth/Throat: Oropharynx is clear and moist and mucous membranes are normal.  Eyes: Pupils are equal, round, and reactive to light. Conjunctivae and EOM are normal.  Neck: Normal range of motion. Neck supple.  Cardiovascular: Regular rhythm, S1 normal and S2 normal. Exam reveals no gallop and no friction  rub.  No murmur heard. Pulmonary/Chest: Effort normal and breath sounds normal. No respiratory distress. She exhibits no tenderness.  Abdominal: Soft. Normal appearance and bowel sounds are normal. There is no hepatosplenomegaly. There is no tenderness. There is no rebound, no guarding, no tenderness at McBurney's point and negative Murphy's sign. No hernia.  Musculoskeletal: Normal range of motion.  Neurological: She is alert and oriented to person, place, and time. She has normal strength. No cranial nerve deficit or sensory deficit. Coordination normal. GCS eye subscore is 4. GCS verbal subscore is 5. GCS motor subscore is 6.  Extraocular muscle movement: normal No visual field cut Pupils: equal and reactive both direct and consensual response is normal No nystagmus present    Sensory function is intact to light touch, pinprick Proprioception intact  Grip strength 5/5 symmetric in upper extremities No pronator drift Normal finger to nose bilaterally  Lower extremity strength 5/5 against gravity    Skin: Skin is warm, dry and intact. No rash noted. No cyanosis.  Psychiatric: She has a normal mood and affect. Her speech is normal and behavior is normal. Thought content normal.  Nursing note and vitals reviewed.    ED Treatments / Results  Labs (all labs ordered are listed, but only abnormal results are displayed) Labs Reviewed  BASIC METABOLIC PANEL - Abnormal; Notable for the following components:      Result Value   CO2 21 (*)    Glucose, Bld 160 (*)    All other components within normal limits  CBC - Abnormal; Notable for the following components:   WBC 16.4 (*)    All other components within normal limits  URINALYSIS, ROUTINE W REFLEX MICROSCOPIC - Abnormal; Notable for the following components:   Color, Urine STRAW (*)    Glucose, UA 50 (*)    Ketones, ur 5 (*)    All other components within normal limits  CBG MONITORING, ED - Abnormal; Notable for the following  components:   Glucose-Capillary 160 (*)    All other components within normal limits    EKG EKG Interpretation  Date/Time:  Monday Aug 14 2017 01:25:41 EDT Ventricular Rate:  88 PR Interval:  228 QRS Duration: 84 QT Interval:  372 QTC Calculation: 450 R Axis:   9 Text Interpretation:  Sinus rhythm with 1st degree A-V block Low voltage QRS Cannot rule out Anterior infarct , age undetermined Abnormal ECG No significant change since last tracing Confirmed by Orpah Greek [phone]) on 08/14/2017 1:30:17 AM   Radiology Ct Head Wo Contrast  Result Date: 08/14/2017 CLINICAL DATA:  Vertigo EXAM: CT HEAD WITHOUT CONTRAST TECHNIQUE: Contiguous axial images were obtained from the base of the skull through the vertex without intravenous contrast. COMPARISON:  None. FINDINGS: Brain: No acute territorial infarction, hemorrhage or intracranial mass. Nonenlarged ventricles. Mild  atrophy. Vascular: No hyperdense vessels.  Carotid vascular calcification Skull: Normal. Negative for fracture or focal lesion. Sinuses/Orbits: No acute finding. Other: None IMPRESSION: Negative non contrasted CT appearance of the brain for age Electronically Signed   By: Donavan Foil M.D.   On: 08/14/2017 03:10   Mr Angiogram Head Wo Contrast  Result Date: 08/14/2017 CLINICAL DATA:  Sudden onset of severe spanning with nausea and vomiting. Vertigo, persistent, central. EXAM: MRI HEAD WITHOUT CONTRAST MRA HEAD WITHOUT CONTRAST MRA NECK WITHOUT AND WITH CONTRAST TECHNIQUE: Multiplanar, multiecho pulse sequences of the brain and surrounding structures were obtained without intravenous contrast. Angiographic images of the Circle of Willis were obtained using MRA technique without intravenous contrast. Angiographic images of the neck were obtained using MRA technique without and with intravenous contrast. Carotid stenosis measurements (when applicable) are obtained utilizing NASCET criteria, using the distal internal carotid  diameter as the denominator. CONTRAST:  56mL MULTIHANCE GADOBENATE DIMEGLUMINE 529 MG/ML IV SOLN COMPARISON:  CT head without contrast 08/14/2017. FINDINGS: MRI HEAD FINDINGS Brain: Mild periventricular T2 hyperintensities are present bilaterally. Dilated perivascular spaces are noted within the basal ganglia. Ventricles are proportionate. No significant extra-axial fluid collection is present. Brainstem and cerebellum are normal. No acute infarct, hemorrhage, or mass lesion is present. The internal auditory canals are within normal limits. The inner ear structures are unremarkable. Vascular: Low is present in the major intracranial arteries. Skull and upper cervical spine: The skull base is within normal limits. Grade 1 anterolisthesis is present at C4-5. Posterior elements appear fused at C2-3 and C3-4. This may be congenital. Marrow signal is normal. Midline sagittal structures are otherwise unremarkable. Sinuses/Orbits: Globes and orbits are within normal limits bilaterally. Mild mucosal thickening is present in the ethmoid air cells. The remaining paranasal sinuses and the mastoid air cells are clear. MRA HEAD FINDINGS The internal carotid arteries are within normal limits from the high cervical segments through the ICA termini bilaterally. The A1 and M1 segments are normal. The anterior communicating artery is not clearly visualized. MCA bifurcations are intact. ACA and MCA branch vessels are within normal limits. The right vertebral artery is the dominant vessel. The left posterior communicating artery is visualized and within normal limits. There is focal irregularity in the mid basilar segment a significant stenosis relative to the more distal vessel. A small left P1 segment is present. The basilar artery otherwise terminates at the basilar artery. Fetal type posterior cerebral arteries are present bilaterally. MRA NECK FINDINGS Time-of-flight images demonstrate no significant flow disturbance at either  carotid bifurcation. Flow is antegrade within the vertebral arteries bilaterally. Postcontrast images demonstrate a standard 3 vessel arch configuration. The left common carotid artery demonstrates mild tortuosity without significant stenosis. Carotid bifurcations are within normal limits bilaterally. There is mild tortuosity of the cervical internal carotid arteries bilaterally without a significant stenosis. The right vertebral artery is the dominant vessel. There is no significant stenosis of either vertebral artery. There is minimal narrowing of the mid basilar artery without a significant stenosis. Fetal type posterior cerebral arteries are confirmed. IMPRESSION: 1. Mild irregularity of the mid basilar artery without a significant stenosis relative to the more distal vessel. 2. No other significant aneurysm, stenosis, or branch vessel occlusion within the circle-of-Willis. 3. Mild tortuosity of the left common carotid artery and bilateral internal carotid arteries without significant stenoses. MRA neck is otherwise normal. 4. Atrophy and white matter changes are likely within normal limits for age. 5. No acute intracranial abnormality. 6. Grade 1 anterolisthesis at  C4-5 as described. Electronically Signed   By: San Morelle M.D.   On: 08/14/2017 07:09   Mr Angiogram Neck W Or Wo Contrast  Result Date: 08/14/2017 CLINICAL DATA:  Sudden onset of severe spanning with nausea and vomiting. Vertigo, persistent, central. EXAM: MRI HEAD WITHOUT CONTRAST MRA HEAD WITHOUT CONTRAST MRA NECK WITHOUT AND WITH CONTRAST TECHNIQUE: Multiplanar, multiecho pulse sequences of the brain and surrounding structures were obtained without intravenous contrast. Angiographic images of the Circle of Willis were obtained using MRA technique without intravenous contrast. Angiographic images of the neck were obtained using MRA technique without and with intravenous contrast. Carotid stenosis measurements (when applicable) are  obtained utilizing NASCET criteria, using the distal internal carotid diameter as the denominator. CONTRAST:  72mL MULTIHANCE GADOBENATE DIMEGLUMINE 529 MG/ML IV SOLN COMPARISON:  CT head without contrast 08/14/2017. FINDINGS: MRI HEAD FINDINGS Brain: Mild periventricular T2 hyperintensities are present bilaterally. Dilated perivascular spaces are noted within the basal ganglia. Ventricles are proportionate. No significant extra-axial fluid collection is present. Brainstem and cerebellum are normal. No acute infarct, hemorrhage, or mass lesion is present. The internal auditory canals are within normal limits. The inner ear structures are unremarkable. Vascular: Low is present in the major intracranial arteries. Skull and upper cervical spine: The skull base is within normal limits. Grade 1 anterolisthesis is present at C4-5. Posterior elements appear fused at C2-3 and C3-4. This may be congenital. Marrow signal is normal. Midline sagittal structures are otherwise unremarkable. Sinuses/Orbits: Globes and orbits are within normal limits bilaterally. Mild mucosal thickening is present in the ethmoid air cells. The remaining paranasal sinuses and the mastoid air cells are clear. MRA HEAD FINDINGS The internal carotid arteries are within normal limits from the high cervical segments through the ICA termini bilaterally. The A1 and M1 segments are normal. The anterior communicating artery is not clearly visualized. MCA bifurcations are intact. ACA and MCA branch vessels are within normal limits. The right vertebral artery is the dominant vessel. The left posterior communicating artery is visualized and within normal limits. There is focal irregularity in the mid basilar segment a significant stenosis relative to the more distal vessel. A small left P1 segment is present. The basilar artery otherwise terminates at the basilar artery. Fetal type posterior cerebral arteries are present bilaterally. MRA NECK FINDINGS  Time-of-flight images demonstrate no significant flow disturbance at either carotid bifurcation. Flow is antegrade within the vertebral arteries bilaterally. Postcontrast images demonstrate a standard 3 vessel arch configuration. The left common carotid artery demonstrates mild tortuosity without significant stenosis. Carotid bifurcations are within normal limits bilaterally. There is mild tortuosity of the cervical internal carotid arteries bilaterally without a significant stenosis. The right vertebral artery is the dominant vessel. There is no significant stenosis of either vertebral artery. There is minimal narrowing of the mid basilar artery without a significant stenosis. Fetal type posterior cerebral arteries are confirmed. IMPRESSION: 1. Mild irregularity of the mid basilar artery without a significant stenosis relative to the more distal vessel. 2. No other significant aneurysm, stenosis, or branch vessel occlusion within the circle-of-Willis. 3. Mild tortuosity of the left common carotid artery and bilateral internal carotid arteries without significant stenoses. MRA neck is otherwise normal. 4. Atrophy and white matter changes are likely within normal limits for age. 5. No acute intracranial abnormality. 6. Grade 1 anterolisthesis at C4-5 as described. Electronically Signed   By: San Morelle M.D.   On: 08/14/2017 07:09   Mr Brain Wo Contrast  Result Date: 08/14/2017 CLINICAL DATA:  Sudden onset of severe spanning with nausea and vomiting. Vertigo, persistent, central. EXAM: MRI HEAD WITHOUT CONTRAST MRA HEAD WITHOUT CONTRAST MRA NECK WITHOUT AND WITH CONTRAST TECHNIQUE: Multiplanar, multiecho pulse sequences of the brain and surrounding structures were obtained without intravenous contrast. Angiographic images of the Circle of Willis were obtained using MRA technique without intravenous contrast. Angiographic images of the neck were obtained using MRA technique without and with intravenous  contrast. Carotid stenosis measurements (when applicable) are obtained utilizing NASCET criteria, using the distal internal carotid diameter as the denominator. CONTRAST:  52mL MULTIHANCE GADOBENATE DIMEGLUMINE 529 MG/ML IV SOLN COMPARISON:  CT head without contrast 08/14/2017. FINDINGS: MRI HEAD FINDINGS Brain: Mild periventricular T2 hyperintensities are present bilaterally. Dilated perivascular spaces are noted within the basal ganglia. Ventricles are proportionate. No significant extra-axial fluid collection is present. Brainstem and cerebellum are normal. No acute infarct, hemorrhage, or mass lesion is present. The internal auditory canals are within normal limits. The inner ear structures are unremarkable. Vascular: Low is present in the major intracranial arteries. Skull and upper cervical spine: The skull base is within normal limits. Grade 1 anterolisthesis is present at C4-5. Posterior elements appear fused at C2-3 and C3-4. This may be congenital. Marrow signal is normal. Midline sagittal structures are otherwise unremarkable. Sinuses/Orbits: Globes and orbits are within normal limits bilaterally. Mild mucosal thickening is present in the ethmoid air cells. The remaining paranasal sinuses and the mastoid air cells are clear. MRA HEAD FINDINGS The internal carotid arteries are within normal limits from the high cervical segments through the ICA termini bilaterally. The A1 and M1 segments are normal. The anterior communicating artery is not clearly visualized. MCA bifurcations are intact. ACA and MCA branch vessels are within normal limits. The right vertebral artery is the dominant vessel. The left posterior communicating artery is visualized and within normal limits. There is focal irregularity in the mid basilar segment a significant stenosis relative to the more distal vessel. A small left P1 segment is present. The basilar artery otherwise terminates at the basilar artery. Fetal type posterior cerebral  arteries are present bilaterally. MRA NECK FINDINGS Time-of-flight images demonstrate no significant flow disturbance at either carotid bifurcation. Flow is antegrade within the vertebral arteries bilaterally. Postcontrast images demonstrate a standard 3 vessel arch configuration. The left common carotid artery demonstrates mild tortuosity without significant stenosis. Carotid bifurcations are within normal limits bilaterally. There is mild tortuosity of the cervical internal carotid arteries bilaterally without a significant stenosis. The right vertebral artery is the dominant vessel. There is no significant stenosis of either vertebral artery. There is minimal narrowing of the mid basilar artery without a significant stenosis. Fetal type posterior cerebral arteries are confirmed. IMPRESSION: 1. Mild irregularity of the mid basilar artery without a significant stenosis relative to the more distal vessel. 2. No other significant aneurysm, stenosis, or branch vessel occlusion within the circle-of-Willis. 3. Mild tortuosity of the left common carotid artery and bilateral internal carotid arteries without significant stenoses. MRA neck is otherwise normal. 4. Atrophy and white matter changes are likely within normal limits for age. 5. No acute intracranial abnormality. 6. Grade 1 anterolisthesis at C4-5 as described. Electronically Signed   By: San Morelle M.D.   On: 08/14/2017 07:09    Procedures Procedures (including critical care time)  Medications Ordered in ED Medications  sodium chloride 0.9 % bolus 500 mL (0 mLs Intravenous Stopped 08/14/17 0355)  ondansetron (ZOFRAN) injection 4 mg (4 mg Intravenous Given 08/14/17 0258)  LORazepam (ATIVAN) injection  0.5 mg (0.5 mg Intravenous Given 08/14/17 0258)  meclizine (ANTIVERT) tablet 12.5 mg (12.5 mg Oral Given 08/14/17 0259)  gadobenate dimeglumine (MULTIHANCE) injection 16 mL (16 mLs Intravenous Contrast Given 08/14/17 0640)     Initial Impression  / Assessment and Plan / ED Course  I have reviewed the triage vital signs and the nursing notes.  Pertinent labs & imaging results that were available during my care of the patient were reviewed by me and considered in my medical decision making (see chart for details).     Patient presents to the emergency department for evaluation of sudden onset vertigo.  Patient was sitting and reading a book when the symptoms began.  She has severe vertiginous symptoms associated with nausea and vomiting.  On arrival she did not have any focal neurologic deficits.  She has had improvement with IV fluids, Ativan, meclizine but is still experiencing some persistent dizziness.  MRI/MRA to further evaluate were performed.  No evidence of any significant vascular abnormality or stroke noted on MRI and MRA.  Patient will be treated for peripheral vertigo.  Final Clinical Impressions(s) / ED Diagnoses   Final diagnoses:  Vertigo  Peripheral vertigo, unspecified laterality    ED Discharge Orders        Ordered    meclizine (ANTIVERT) 25 MG tablet  Every 8 hours PRN     08/14/17 0716       Orpah Greek, MD 08/14/17 3428    Orpah Greek, MD 08/14/17 [phone]

## 2017-08-14 NOTE — ED Triage Notes (Addendum)
Pt arrived by EMS for dizziness onset at 2200 with emesis. 4mg  zofran given enroute Pt reports being bit by a tick on Saturday, and recently finished amoxicillen for jaw pain.

## 2017-08-27 ENCOUNTER — Other Ambulatory Visit: Payer: Self-pay | Admitting: Podiatry

## 2017-08-28 ENCOUNTER — Encounter: Payer: Self-pay | Admitting: Family Medicine

## 2017-08-28 ENCOUNTER — Ambulatory Visit (INDEPENDENT_AMBULATORY_CARE_PROVIDER_SITE_OTHER): Payer: PPO | Admitting: Family Medicine

## 2017-08-28 VITALS — BP 130/80 | HR 75 | Temp 98.4°F | Ht 64.0 in | Wt 180.8 lb

## 2017-08-28 DIAGNOSIS — R42 Dizziness and giddiness: Secondary | ICD-10-CM

## 2017-08-28 DIAGNOSIS — K529 Noninfective gastroenteritis and colitis, unspecified: Secondary | ICD-10-CM | POA: Diagnosis not present

## 2017-08-28 NOTE — Patient Instructions (Signed)

## 2017-08-28 NOTE — Progress Notes (Signed)
Dr. Frederico Hamman T. Aundraya Dripps, MD, [facility] [place], [address] Phone: [phone] Fax: [phone]  08/28/2017  Patient: Melissa Cox, MRN: [id number], DOB: Jan 12, 1951, 67 y.o.  Primary Physician:  Owens Loffler, MD   Chief Complaint  Patient presents with  . Hospitalization Follow-up    Vertigo   Subjective:   Melissa Cox is a 67 y.o. very pleasant female patient who presents with the following:  F/u ER, vertigo: 08/14/2017   Feeling better, some times will get fleeting moments, went on for hours ad got sick and had some throwing up and dirrhea. Called brother. Trembling and shaking violently.   Initially she felt really bad, and was weak.  She went to the ER, and they did a full assessment as well as an MRI and MRA of the brain to rule out a potential stroke, and this was normal without any evidence of infarct.  Diagnosed with vertigo, she is rarely use meclizine.  Symptoms have improved, though they are still persistent now 2 weeks out.  Past Medical History, Surgical History, Social History, Family History, Problem List, Medications, and Allergies have been reviewed and updated if relevant.  Patient Active Problem List   Diagnosis Date Noted  . Lymphadenitis 12/08/2016  . Pain of right heel 10/14/2016  . 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 Unspecified vitamin D deficiency 05/17/2013    Past Medical History:  Diagnosis Date  . Anemia    years ago - had hysterectomy to stop - hgb 5.2  . Arthritis    hands  . Blood transfusion without reported diagnosis    prior to hysterectomy  . Cataract    beginnings   . Diverticulitis 2018  . GERD (gastroesophageal reflux disease)    occ -uses TUMS  . Lichen sclerosus   . 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 Unspecified essential hypertension 10/21/2008    Past  Surgical History:  Procedure Laterality Date  . ABDOMINAL HYSTERECTOMY  1017,5102   partial, fibroids  . BREAST LUMPECTOMY     right,benign  . MENISCUS REPAIR Left     Social History   Socioeconomic History  . Marital status: Married    Spouse name: Not on file  . Number of children: Not on file  . Years of education: Not on file  . Highest education level: Not on file  Occupational History  . Not on file  Social Needs  . Financial resource strain: Not on file  . Food insecurity:    Worry: Not on file    Inability: Not on file  . Transportation needs:    Medical: Not on file    Non-medical: Not on file  Tobacco Use  . Smoking status: Never Smoker  . Smokeless tobacco: Never Used  Substance and Sexual Activity  . Alcohol use: Yes    Comment: occ glass of wine  . Drug use: No  . Sexual activity: Not on file  Lifestyle  . Physical activity:    Days per week: Not on file    Minutes per session: Not on file  . Stress: Not on file  Relationships  . Social connections:    Talks on phone: Not on file    Gets together: Not on file    Attends religious service: Not on file    Active member of club or organization: Not on file  Attends meetings of clubs or organizations: Not on file    Relationship status: Not on file  . Intimate partner violence:    Fear of current or ex partner: Not on file    Emotionally abused: Not on file    Physically abused: Not on file    Forced sexual activity: Not on file  Other Topics Concern  . Not on file  Social History Narrative  . Not on file    Family History  Problem Relation Age of Onset  . Goiter Mother   . Heart disease Father   . Hypothyroidism Sister   . Stomach cancer Sister   . Colon cancer Maternal Grandmother   . Colon polyps Neg Hx   . Esophageal cancer Neg Hx   . Rectal cancer Neg Hx     Allergies  Allergen Reactions  . Demerol [Meperidine] Rash  . Diphenhydramine Hcl     REACTION: abd cramps and diarrhea    . Epinephrine Other (See Comments)    Increased heart rate   . Meperidine Hcl     REACTION: rash  . Prochlorperazine Edisylate     REACTION: increased heart rate- compazine     Medication list reviewed and updated in full in [facility].   GEN: No acute illnesses, no fevers, chills. GI: No n/v/d, eating normally Pulm: No SOB Interactive and getting along well at home.  Otherwise, ROS is as per the HPI.  Objective:   BP 130/80   Pulse 75   Temp 98.4 F (36.9 C) (Oral)   Ht 5\' 4"  (1.626 m)   Wt 180 lb 12 oz (82 kg)   BMI 31.03 kg/m   GEN: WDWN, NAD, Non-toxic, A & O x 3 HEENT: Atraumatic, Normocephalic. Neck supple. No masses, No LAD. Ears and Nose: No external deformity. CV: RRR, No M/G/R. No JVD. No thrill. No extra heart sounds. PULM: CTA B, no wheezes, crackles, rhonchi. No retractions. No resp. distress. No accessory muscle use. EXTR: No c/c/e NEURO Normal gait.  PSYCH: Normally interactive. Conversant. Not depressed or anxious appearing.  Calm demeanor.   Laboratory and Imaging Data: Results for orders placed or performed during the hospital encounter of 78/29/56  Basic metabolic panel  Result Value Ref Range   Sodium 139 135 - 145 mmol/L   Potassium 3.6 3.5 - 5.1 mmol/L   Chloride 106 101 - 111 mmol/L   CO2 21 (L) 22 - 32 mmol/L   Glucose, Bld 160 (H) 65 - 99 mg/dL   BUN 13 6 - 20 mg/dL   Creatinine, Ser 0.71 0.44 - 1.00 mg/dL   Calcium 9.1 8.9 - 10.3 mg/dL   GFR calc non Af Amer >60 >60 mL/min   GFR calc Af Amer >60 >60 mL/min   Anion gap 12 5 - 15  CBC  Result Value Ref Range   WBC 16.4 (H) 4.0 - 10.5 K/uL   RBC 4.78 3.87 - 5.11 MIL/uL   Hemoglobin 13.6 12.0 - 15.0 g/dL   HCT 41.6 36.0 - 46.0 %   MCV 87.0 78.0 - 100.0 fL   MCH 28.5 26.0 - 34.0 pg   MCHC 32.7 30.0 - 36.0 g/dL   RDW 13.6 11.5 - 15.5 %   Platelets 279 150 - 400 K/uL  Urinalysis, Routine w reflex microscopic  Result Value Ref Range   Color, Urine STRAW (A) YELLOW    APPearance CLEAR CLEAR   Specific Gravity, Urine 1.013 1.005 - 1.030   pH 8.0 5.0 -  8.0   Glucose, UA 50 (A) NEGATIVE mg/dL   Hgb urine dipstick NEGATIVE NEGATIVE   Bilirubin Urine NEGATIVE NEGATIVE   Ketones, ur 5 (A) NEGATIVE mg/dL   Protein, ur NEGATIVE NEGATIVE mg/dL   Nitrite NEGATIVE NEGATIVE   Leukocytes, UA NEGATIVE NEGATIVE  CBG monitoring, ED  Result Value Ref Range   Glucose-Capillary 160 (H) 65 - 99 mg/dL    Ct Head Wo Contrast  Result Date: 08/14/2017 CLINICAL DATA:  Vertigo EXAM: CT HEAD WITHOUT CONTRAST TECHNIQUE: Contiguous axial images were obtained from the base of the skull through the vertex without intravenous contrast. COMPARISON:  None. FINDINGS: Brain: No acute territorial infarction, hemorrhage or intracranial mass. Nonenlarged ventricles. Mild atrophy. Vascular: No hyperdense vessels.  Carotid vascular calcification Skull: Normal. Negative for fracture or focal lesion. Sinuses/Orbits: No acute finding. Other: None IMPRESSION: Negative non contrasted CT appearance of the brain for age Electronically Signed   By: Donavan Foil M.D.   On: 08/14/2017 03:10   Mr Angiogram Head Wo Contrast  Result Date: 08/14/2017 CLINICAL DATA:  Sudden onset of severe spanning with nausea and vomiting. Vertigo, persistent, central. EXAM: MRI HEAD WITHOUT CONTRAST MRA HEAD WITHOUT CONTRAST MRA NECK WITHOUT AND WITH CONTRAST TECHNIQUE: Multiplanar, multiecho pulse sequences of the brain and surrounding structures were obtained without intravenous contrast. Angiographic images of the Circle of Willis were obtained using MRA technique without intravenous contrast. Angiographic images of the neck were obtained using MRA technique without and with intravenous contrast. Carotid stenosis measurements (when applicable) are obtained utilizing NASCET criteria, using the distal internal carotid diameter as the denominator. CONTRAST:  38mL MULTIHANCE GADOBENATE DIMEGLUMINE 529 MG/ML IV SOLN COMPARISON:   CT head without contrast 08/14/2017. FINDINGS: MRI HEAD FINDINGS Brain: Mild periventricular T2 hyperintensities are present bilaterally. Dilated perivascular spaces are noted within the basal ganglia. Ventricles are proportionate. No significant extra-axial fluid collection is present. Brainstem and cerebellum are normal. No acute infarct, hemorrhage, or mass lesion is present. The internal auditory canals are within normal limits. The inner ear structures are unremarkable. Vascular: Low is present in the major intracranial arteries. Skull and upper cervical spine: The skull base is within normal limits. Grade 1 anterolisthesis is present at C4-5. Posterior elements appear fused at C2-3 and C3-4. This may be congenital. Marrow signal is normal. Midline sagittal structures are otherwise unremarkable. Sinuses/Orbits: Globes and orbits are within normal limits bilaterally. Mild mucosal thickening is present in the ethmoid air cells. The remaining paranasal sinuses and the mastoid air cells are clear. MRA HEAD FINDINGS The internal carotid arteries are within normal limits from the high cervical segments through the ICA termini bilaterally. The A1 and M1 segments are normal. The anterior communicating artery is not clearly visualized. MCA bifurcations are intact. ACA and MCA branch vessels are within normal limits. The right vertebral artery is the dominant vessel. The left posterior communicating artery is visualized and within normal limits. There is focal irregularity in the mid basilar segment a significant stenosis relative to the more distal vessel. A small left P1 segment is present. The basilar artery otherwise terminates at the basilar artery. Fetal type posterior cerebral arteries are present bilaterally. MRA NECK FINDINGS Time-of-flight images demonstrate no significant flow disturbance at either carotid bifurcation. Flow is antegrade within the vertebral arteries bilaterally. Postcontrast images demonstrate  a standard 3 vessel arch configuration. The left common carotid artery demonstrates mild tortuosity without significant stenosis. Carotid bifurcations are within normal limits bilaterally. There is mild tortuosity of the cervical internal carotid  arteries bilaterally without a significant stenosis. The right vertebral artery is the dominant vessel. There is no significant stenosis of either vertebral artery. There is minimal narrowing of the mid basilar artery without a significant stenosis. Fetal type posterior cerebral arteries are confirmed. IMPRESSION: 1. Mild irregularity of the mid basilar artery without a significant stenosis relative to the more distal vessel. 2. No other significant aneurysm, stenosis, or branch vessel occlusion within the circle-of-Willis. 3. Mild tortuosity of the left common carotid artery and bilateral internal carotid arteries without significant stenoses. MRA neck is otherwise normal. 4. Atrophy and white matter changes are likely within normal limits for age. 5. No acute intracranial abnormality. 6. Grade 1 anterolisthesis at C4-5 as described. Electronically Signed   By: San Morelle M.D.   On: 08/14/2017 07:09   Mr Angiogram Neck W Or Wo Contrast  Result Date: 08/14/2017 CLINICAL DATA:  Sudden onset of severe spanning with nausea and vomiting. Vertigo, persistent, central. EXAM: MRI HEAD WITHOUT CONTRAST MRA HEAD WITHOUT CONTRAST MRA NECK WITHOUT AND WITH CONTRAST TECHNIQUE: Multiplanar, multiecho pulse sequences of the brain and surrounding structures were obtained without intravenous contrast. Angiographic images of the Circle of Willis were obtained using MRA technique without intravenous contrast. Angiographic images of the neck were obtained using MRA technique without and with intravenous contrast. Carotid stenosis measurements (when applicable) are obtained utilizing NASCET criteria, using the distal internal carotid diameter as the denominator. CONTRAST:  67mL  MULTIHANCE GADOBENATE DIMEGLUMINE 529 MG/ML IV SOLN COMPARISON:  CT head without contrast 08/14/2017. FINDINGS: MRI HEAD FINDINGS Brain: Mild periventricular T2 hyperintensities are present bilaterally. Dilated perivascular spaces are noted within the basal ganglia. Ventricles are proportionate. No significant extra-axial fluid collection is present. Brainstem and cerebellum are normal. No acute infarct, hemorrhage, or mass lesion is present. The internal auditory canals are within normal limits. The inner ear structures are unremarkable. Vascular: Low is present in the major intracranial arteries. Skull and upper cervical spine: The skull base is within normal limits. Grade 1 anterolisthesis is present at C4-5. Posterior elements appear fused at C2-3 and C3-4. This may be congenital. Marrow signal is normal. Midline sagittal structures are otherwise unremarkable. Sinuses/Orbits: Globes and orbits are within normal limits bilaterally. Mild mucosal thickening is present in the ethmoid air cells. The remaining paranasal sinuses and the mastoid air cells are clear. MRA HEAD FINDINGS The internal carotid arteries are within normal limits from the high cervical segments through the ICA termini bilaterally. The A1 and M1 segments are normal. The anterior communicating artery is not clearly visualized. MCA bifurcations are intact. ACA and MCA branch vessels are within normal limits. The right vertebral artery is the dominant vessel. The left posterior communicating artery is visualized and within normal limits. There is focal irregularity in the mid basilar segment a significant stenosis relative to the more distal vessel. A small left P1 segment is present. The basilar artery otherwise terminates at the basilar artery. Fetal type posterior cerebral arteries are present bilaterally. MRA NECK FINDINGS Time-of-flight images demonstrate no significant flow disturbance at either carotid bifurcation. Flow is antegrade within  the vertebral arteries bilaterally. Postcontrast images demonstrate a standard 3 vessel arch configuration. The left common carotid artery demonstrates mild tortuosity without significant stenosis. Carotid bifurcations are within normal limits bilaterally. There is mild tortuosity of the cervical internal carotid arteries bilaterally without a significant stenosis. The right vertebral artery is the dominant vessel. There is no significant stenosis of either vertebral artery. There is minimal narrowing of the  mid basilar artery without a significant stenosis. Fetal type posterior cerebral arteries are confirmed. IMPRESSION: 1. Mild irregularity of the mid basilar artery without a significant stenosis relative to the more distal vessel. 2. No other significant aneurysm, stenosis, or branch vessel occlusion within the circle-of-Willis. 3. Mild tortuosity of the left common carotid artery and bilateral internal carotid arteries without significant stenoses. MRA neck is otherwise normal. 4. Atrophy and white matter changes are likely within normal limits for age. 5. No acute intracranial abnormality. 6. Grade 1 anterolisthesis at C4-5 as described. Electronically Signed   By: San Morelle M.D.   On: 08/14/2017 07:09   Mr Brain Wo Contrast  Result Date: 08/14/2017 CLINICAL DATA:  Sudden onset of severe spanning with nausea and vomiting. Vertigo, persistent, central. EXAM: MRI HEAD WITHOUT CONTRAST MRA HEAD WITHOUT CONTRAST MRA NECK WITHOUT AND WITH CONTRAST TECHNIQUE: Multiplanar, multiecho pulse sequences of the brain and surrounding structures were obtained without intravenous contrast. Angiographic images of the Circle of Willis were obtained using MRA technique without intravenous contrast. Angiographic images of the neck were obtained using MRA technique without and with intravenous contrast. Carotid stenosis measurements (when applicable) are obtained utilizing NASCET criteria, using the distal internal  carotid diameter as the denominator. CONTRAST:  31mL MULTIHANCE GADOBENATE DIMEGLUMINE 529 MG/ML IV SOLN COMPARISON:  CT head without contrast 08/14/2017. FINDINGS: MRI HEAD FINDINGS Brain: Mild periventricular T2 hyperintensities are present bilaterally. Dilated perivascular spaces are noted within the basal ganglia. Ventricles are proportionate. No significant extra-axial fluid collection is present. Brainstem and cerebellum are normal. No acute infarct, hemorrhage, or mass lesion is present. The internal auditory canals are within normal limits. The inner ear structures are unremarkable. Vascular: Low is present in the major intracranial arteries. Skull and upper cervical spine: The skull base is within normal limits. Grade 1 anterolisthesis is present at C4-5. Posterior elements appear fused at C2-3 and C3-4. This may be congenital. Marrow signal is normal. Midline sagittal structures are otherwise unremarkable. Sinuses/Orbits: Globes and orbits are within normal limits bilaterally. Mild mucosal thickening is present in the ethmoid air cells. The remaining paranasal sinuses and the mastoid air cells are clear. MRA HEAD FINDINGS The internal carotid arteries are within normal limits from the high cervical segments through the ICA termini bilaterally. The A1 and M1 segments are normal. The anterior communicating artery is not clearly visualized. MCA bifurcations are intact. ACA and MCA branch vessels are within normal limits. The right vertebral artery is the dominant vessel. The left posterior communicating artery is visualized and within normal limits. There is focal irregularity in the mid basilar segment a significant stenosis relative to the more distal vessel. A small left P1 segment is present. The basilar artery otherwise terminates at the basilar artery. Fetal type posterior cerebral arteries are present bilaterally. MRA NECK FINDINGS Time-of-flight images demonstrate no significant flow disturbance at  either carotid bifurcation. Flow is antegrade within the vertebral arteries bilaterally. Postcontrast images demonstrate a standard 3 vessel arch configuration. The left common carotid artery demonstrates mild tortuosity without significant stenosis. Carotid bifurcations are within normal limits bilaterally. There is mild tortuosity of the cervical internal carotid arteries bilaterally without a significant stenosis. The right vertebral artery is the dominant vessel. There is no significant stenosis of either vertebral artery. There is minimal narrowing of the mid basilar artery without a significant stenosis. Fetal type posterior cerebral arteries are confirmed. IMPRESSION: 1. Mild irregularity of the mid basilar artery without a significant stenosis relative to the more distal  vessel. 2. No other significant aneurysm, stenosis, or branch vessel occlusion within the circle-of-Willis. 3. Mild tortuosity of the left common carotid artery and bilateral internal carotid arteries without significant stenoses. MRA neck is otherwise normal. 4. Atrophy and white matter changes are likely within normal limits for age. 5. No acute intracranial abnormality. 6. Grade 1 anterolisthesis at C4-5 as described. Electronically Signed   By: San Morelle M.D.   On: 08/14/2017 07:09     Assessment and Plan:   Vertigo  Gastroenteritis  Vertigo, is not clear to me if this is BPV versus labyrinthitis.  It is improving.  She actually has an appointment with ENT on Friday to follow-up on separate issue.  I spoke with her to ask Dr. Tami Ribas about his opinion also start some Epley maneuvers.  Given that she was having some nausea, vomiting, diarrhea, I think that this is an entirely unrelated issue and she likely had some gastroenteritis which probably resolved.  Follow-up: No follow-ups on file.  Signed,  Maud Deed. Chiann Goffredo, MD   Allergies as of 08/28/2017      Reactions   Demerol [meperidine] Rash    Diphenhydramine Hcl    REACTION: abd cramps and diarrhea   Epinephrine Other (See Comments)   Increased heart rate    Meperidine Hcl    REACTION: rash   Prochlorperazine Edisylate    REACTION: increased heart rate- compazine       Medication List        Accurate as of 08/28/17 11:59 PM. Always use your most recent med list.          magic mouthwash Soln SWISH 5 ML BY MOUTH ,HOLD AND EXPECTORATE, REPEAT NO MORE THAN EVERY 4 HOURS   meclizine 25 MG tablet Commonly known as:  ANTIVERT Take 1 tablet (25 mg total) by mouth every 8 (eight) hours as needed for dizziness.   meloxicam 15 MG tablet Commonly known as:  MOBIC Take 1 tablet (15 mg total) by mouth daily.   SYNTHROID 112 MCG tablet Generic drug:  levothyroxine TAKE 1 TABLET (112 MCG TOTAL) BY MOUTH DAILY BEFORE BREAKFAST.

## 2017-08-30 DIAGNOSIS — R42 Dizziness and giddiness: Secondary | ICD-10-CM | POA: Diagnosis not present

## 2017-08-30 DIAGNOSIS — R221 Localized swelling, mass and lump, neck: Secondary | ICD-10-CM | POA: Diagnosis not present

## 2017-09-02 ENCOUNTER — Other Ambulatory Visit: Payer: Self-pay | Admitting: Family Medicine

## 2017-09-04 ENCOUNTER — Ambulatory Visit (INDEPENDENT_AMBULATORY_CARE_PROVIDER_SITE_OTHER): Payer: PPO | Admitting: Family Medicine

## 2017-09-04 ENCOUNTER — Encounter: Payer: Self-pay | Admitting: Family Medicine

## 2017-09-04 VITALS — BP 160/96 | HR 91 | Temp 98.8°F | Ht 64.0 in | Wt 180.5 lb

## 2017-09-04 DIAGNOSIS — J069 Acute upper respiratory infection, unspecified: Secondary | ICD-10-CM

## 2017-09-04 MED ORDER — DOXYCYCLINE HYCLATE 100 MG PO TABS: 100.0000 mg | ORAL_TABLET | Freq: Two times a day (BID) | ORAL | 0 refills | Status: DC

## 2017-09-04 NOTE — Telephone Encounter (Signed)
Last office visit 08/28/2017.  Last refilled 08/14/2017 for #15 with no refills.  Ok to refill?

## 2017-09-04 NOTE — Progress Notes (Signed)
Dr. Frederico Hamman T. Kensie Susman, MD, [facility] [place], [address] Phone: [phone] Fax: [phone]  09/04/2017  Patient: Melissa Cox, MRN: [id number], DOB: 11-Nov-1950, 67 y.o.  Primary Physician:  Owens Loffler, MD   Chief Complaint  Patient presents with  . Cough  . Nasal Congestion  . Sinus Drainage  . Ear Pain  . Headache   Subjective:   This 67 y.o. female patient presents with runny nose, sneezing, cough, sore throat, malaise and minimal / low-grade fever .  Company last weekend and was staying last weekend and one of the guests was sick. Drinage down the back of her throat and coughing. Did not sleep well. Stye that is improving.   + recent exposure to others with similar symptoms.   The patent denies sore throat as the primary complaint. Denies sthortness of breath/wheezing, high fever, chest pain, rhinits for more than 14 days, significant myalgia, otalgia, facial pain, abdominal pain, changes in bowel or bladder.  PMH, PHS, Allergies, Problem List, Medications, Family History, and Social History have all been reviewed.  Patient Active Problem List   Diagnosis Date Noted  . Lymphadenitis 12/08/2016  . Pain of right heel 10/14/2016  . 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 Unspecified vitamin D deficiency 05/17/2013    Past Medical History:  Diagnosis Date  . Anemia    years ago - had hysterectomy to stop - hgb 5.2  . Arthritis    hands  . Blood transfusion without reported diagnosis    prior to hysterectomy  . Cataract    beginnings   . Diverticulitis 2018  . GERD (gastroesophageal reflux disease)    occ -uses TUMS  . Lichen sclerosus   . 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 Unspecified essential hypertension 10/21/2008    Past Surgical History:  Procedure Laterality Date  . ABDOMINAL HYSTERECTOMY   4259,5638   partial, fibroids  . BREAST LUMPECTOMY     right,benign  . MENISCUS REPAIR Left     Social History   Socioeconomic History  . Marital status: Married    Spouse name: Not on file  . Number of children: Not on file  . Years of education: Not on file  . Highest education level: Not on file  Occupational History  . Not on file  Social Needs  . Financial resource strain: Not on file  . Food insecurity:    Worry: Not on file    Inability: Not on file  . Transportation needs:    Medical: Not on file    Non-medical: Not on file  Tobacco Use  . Smoking status: Never Smoker  . Smokeless tobacco: Never Used  Substance and Sexual Activity  . Alcohol use: Yes    Comment: occ glass of wine  . Drug use: No  . Sexual activity: Not on file  Lifestyle  . Physical activity:    Days per week: Not on file    Minutes per session: Not on file  . Stress: Not on file  Relationships  . Social connections:    Talks on phone: Not on file    Gets together: Not on file    Attends religious service: Not on file    Active member of club or organization: Not on file    Attends meetings of clubs or organizations: Not on file    Relationship status: Not  on file  . Intimate partner violence:    Fear of current or ex partner: Not on file    Emotionally abused: Not on file    Physically abused: Not on file    Forced sexual activity: Not on file  Other Topics Concern  . Not on file  Social History Narrative  . Not on file    Family History  Problem Relation Age of Onset  . Goiter Mother   . Heart disease Father   . Hypothyroidism Sister   . Stomach cancer Sister   . Colon cancer Maternal Grandmother   . Colon polyps Neg Hx   . Esophageal cancer Neg Hx   . Rectal cancer Neg Hx     Allergies  Allergen Reactions  . Demerol [Meperidine] Rash  . Diphenhydramine Hcl     REACTION: abd cramps and diarrhea  . Epinephrine Other (See Comments)    Increased heart rate   .  Meperidine Hcl     REACTION: rash  . Prochlorperazine Edisylate     REACTION: increased heart rate- compazine     Medication list reviewed and updated in full in [facility].  ROS as above, eating and drinking - tolerating PO. Urinating normally. No excessive vomitting or diarrhea. O/w as above.  Objective:   Blood pressure (!) 160/96, pulse 91, temperature 98.8 F (37.1 C), temperature source Oral, height 5\' 4"  (1.626 m), weight 180 lb 8 oz (81.9 kg), SpO2 96 %.  GEN: WDWN, Non-toxic, Atraumatic, normocephalic. A and O x 3. HEENT: Oropharynx clear without exudate, MMM, no significant LAD, mild rhinnorhea Ears: TM clear, COL visualized with good landmarks CV: RRR, no m/g/r. Pulm: CTA B, no wheezes, rhonchi, or crackles, normal respiratory effort. EXT: no c/c/e Psych: well oriented, neither depressed nor anxious in appearance  Objective Data:  Assessment and Plan:   URI, acute  Supportive care reviewed with patient. See patient instruction section.  Likely viral, ok to hold paper script - reviewed signs of worsening and when to use.  Follow-up: No follow-ups on file.  Meds ordered this encounter  Medications  . doxycycline (VIBRA-TABS) 100 MG tablet    Sig: Take 1 tablet (100 mg total) by mouth 2 (two) times daily for 10 days.    Dispense:  20 tablet    Refill:  0   Signed,  Cavion Faiola T. Akhil Piscopo, MD   Patient's Medications  New Prescriptions   DOXYCYCLINE (VIBRA-TABS) 100 MG TABLET    Take 1 tablet (100 mg total) by mouth 2 (two) times daily for 10 days.  Previous Medications   MAGIC MOUTHWASH SOLN    SWISH 5 ML BY MOUTH ,HOLD AND EXPECTORATE, REPEAT NO MORE THAN EVERY 4 HOURS   MECLIZINE (ANTIVERT) 25 MG TABLET    TAKE ONE TABLET BY MOUTH EVERY 8 HOURS AS NEEDED FOR DIZZINESS   MELOXICAM (MOBIC) 15 MG TABLET    Take 1 tablet (15 mg total) by mouth daily.   SYNTHROID 112 MCG TABLET    TAKE 1 TABLET (112 MCG TOTAL) BY MOUTH DAILY BEFORE BREAKFAST.  Modified  Medications   No medications on file  Discontinued Medications   No medications on file

## 2017-09-08 ENCOUNTER — Other Ambulatory Visit: Payer: Self-pay

## 2017-09-08 ENCOUNTER — Ambulatory Visit (INDEPENDENT_AMBULATORY_CARE_PROVIDER_SITE_OTHER): Payer: PPO | Admitting: Family Medicine

## 2017-09-08 ENCOUNTER — Encounter: Payer: Self-pay | Admitting: Family Medicine

## 2017-09-08 DIAGNOSIS — J209 Acute bronchitis, unspecified: Secondary | ICD-10-CM | POA: Insufficient documentation

## 2017-09-08 MED ORDER — AZITHROMYCIN 250 MG PO TABS: ORAL_TABLET | ORAL | 0 refills | Status: DC

## 2017-09-08 NOTE — Progress Notes (Signed)
Subjective:    Patient ID: Melissa Cox, female    DOB: July 16, 1950, 67 y.o.   MRN: [id number]  HPI Here for cough and congestion   abx are making her sick  Dx with uri Given doxycycline to hold - she filled it several days later   Made her feel sick/bad- nausea/dizziness/ malaise  Stopped it after 3 dose   Felt worse today  A lot of cough all night  Can hear a rattle  Feels phlegm but she cannot get it out  Also pnd   Has dayqul and nyquil   Patient Active Problem List   Diagnosis Date Noted  . Acute bronchitis 09/08/2017  . Lymphadenitis 12/08/2016  . Pain of right heel 10/14/2016  . 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 Unspecified vitamin D deficiency 05/17/2013   Past Medical History:  Diagnosis Date  . Anemia    years ago - had hysterectomy to stop - hgb 5.2  . Arthritis    hands  . Blood transfusion without reported diagnosis    prior to hysterectomy  . Cataract    beginnings   . Diverticulitis 2018  . GERD (gastroesophageal reflux disease)    occ -uses TUMS  . Lichen sclerosus   . 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 Unspecified essential hypertension 10/21/2008   Past Surgical History:  Procedure Laterality Date  . ABDOMINAL HYSTERECTOMY  4967,5916   partial, fibroids  . BREAST LUMPECTOMY     right,benign  . MENISCUS REPAIR Left    Social History   Tobacco Use  . Smoking status: Never Smoker  . Smokeless tobacco: Never Used  Substance Use Topics  . Alcohol use: Yes    Comment: occ glass of wine  . Drug use: No   Family History  Problem Relation Age of Onset  . Goiter Mother   . Heart disease Father   . Hypothyroidism Sister   . Stomach cancer Sister   . Colon cancer Maternal Grandmother   . Colon polyps Neg Hx   . Esophageal cancer Neg Hx   . Rectal cancer Neg Hx    Allergies  Allergen Reactions  . Demerol [Meperidine] Rash  .  Diphenhydramine Hcl     REACTION: abd cramps and diarrhea  . Doxycycline Nausea Only  . Epinephrine Other (See Comments)    Increased heart rate   . Meperidine Hcl     REACTION: rash  . Prochlorperazine Edisylate     REACTION: increased heart rate- compazine    Current Outpatient Medications on File Prior to Visit  Medication Sig Dispense Refill  . magic mouthwash SOLN SWISH 5 ML BY MOUTH ,HOLD AND EXPECTORATE, REPEAT NO MORE THAN EVERY 4 HOURS  0  . meclizine (ANTIVERT) 25 MG tablet TAKE ONE TABLET BY MOUTH EVERY 8 HOURS AS NEEDED FOR DIZZINESS 50 tablet 1  . meloxicam (MOBIC) 15 MG tablet Take 1 tablet (15 mg total) by mouth daily. 30 tablet 3  . SYNTHROID 112 MCG tablet TAKE 1 TABLET (112 MCG TOTAL) BY MOUTH DAILY BEFORE BREAKFAST. 90 tablet 3   No current facility-administered medications on file prior to visit.     Review of Systems  Constitutional: Positive for appetite change and fatigue. Negative for fever.  HENT: Positive for congestion, postnasal drip, rhinorrhea, sinus pressure, sneezing and sore throat. Negative for ear pain.   Eyes: Negative for pain and discharge.  Respiratory: Positive for cough. Negative  for shortness of breath, wheezing and stridor.   Cardiovascular: Negative for chest pain.  Gastrointestinal: Negative for diarrhea, nausea and vomiting.  Genitourinary: Negative for frequency, hematuria and urgency.  Musculoskeletal: Negative for arthralgias and myalgias.  Skin: Negative for rash.  Neurological: Positive for headaches. Negative for dizziness, weakness and light-headedness.  Psychiatric/Behavioral: Negative for confusion and dysphoric mood.       Objective:   Physical Exam  Constitutional: She appears well-developed and well-nourished. No distress.  HENT:  Head: Normocephalic and atraumatic.  Right Ear: External ear normal.  Left Ear: External ear normal.  Mouth/Throat: Oropharynx is clear and moist.  Nares are injected and congested  No  sinus tenderness Clear rhinorrhea and post nasal drip   Eyes: Pupils are equal, round, and reactive to light. Conjunctivae and EOM are normal. Right eye exhibits no discharge. Left eye exhibits no discharge.  Neck: Normal range of motion. Neck supple.  Cardiovascular: Normal rate and normal heart sounds.  Pulmonary/Chest: Effort normal and breath sounds normal. No stridor. No respiratory distress. She has no wheezes. She has no rales. She exhibits no tenderness.  Harsh bs occ scattered rhonchi  No rales  Lymphadenopathy:    She has no cervical adenopathy.  Neurological: She is alert.  Skin: Skin is warm and dry. No rash noted.  Psychiatric: She has a normal mood and affect.          Assessment & Plan:   Problem List Items Addressed This Visit      Respiratory   Acute bronchitis    Into of doxycycline Change to zpak Disc symptomatic care - see instructions on AVS  Rest and drink fluids  Take the zithromax as directed   mucinex DM is a good choice for cough over the counter as needed  It will help loosen and suppress cough  Watch for fever -tylenol is ok to use   Update if not starting to improve in a week or if worsening    Meds ordered this encounter  Medications  . azithromycin (ZITHROMAX Z-PAK) 250 MG tablet    Sig: Take 2 pills by mouth today and then 1 pill daily for 4 days    Dispense:  6 tablet    Refill:  0

## 2017-09-08 NOTE — Patient Instructions (Signed)
Rest and drink fluids  Take the zithromax as directed   mucinex DM is a good choice for cough over the counter as needed  It will help loosen and suppress cough  Watch for fever -tylenol is ok to use   Update if not starting to improve in a week or if worsening

## 2017-09-08 NOTE — Patient Outreach (Signed)
[facility]) Care Management  09/08/2017  Melissa Cox January 13, 1951 [id number]   Referral Date: 09/07/17 Referral Source: Nurseline Referral Reason: cough and congestion   Outreach Attempt #1 Spoke with patient.  She is able to verify HIPAA.  Discussed reason for the call. Patient states she is still sick but she has an appointment with the doctor this morning.  Patient offers no concerns.  Advised CM would send letter and brochure for future reference.     Plan: RN CM will send letter and brochure and close case.   Jone Baseman, RN, MSN [facility] Care Management Care Management Coordinator Direct Line [phone] Toll Free: [phone]  Fax: [phone]

## 2017-09-09 NOTE — Assessment & Plan Note (Signed)
Into of doxycycline Change to zpak Disc symptomatic care - see instructions on AVS  Rest and drink fluids  Take the zithromax as directed   mucinex DM is a good choice for cough over the counter as needed  It will help loosen and suppress cough  Watch for fever -tylenol is ok to use   Update if not starting to improve in a week or if worsening    Meds ordered this encounter  Medications  . azithromycin (ZITHROMAX Z-PAK) 250 MG tablet    Sig: Take 2 pills by mouth today and then 1 pill daily for 4 days    Dispense:  6 tablet    Refill:  0

## 2018-01-28 IMAGING — US US SOFT TISSUE HEAD/NECK
1 series · 14 of 14 positions shown · non-contrast
Comparison: None.

CLINICAL DATA: Neck mass

EXAM:
ULTRASOUND OF HEAD/NECK SOFT TISSUES
TECHNIQUE: Ultrasound examination of the head and neck soft tissues was
performed in the area of clinical concern.

[Series 1: us soft tissue head/neck · 0.06mm/px · 14 acquisitions, 14 frames shown]
[im 1/14]
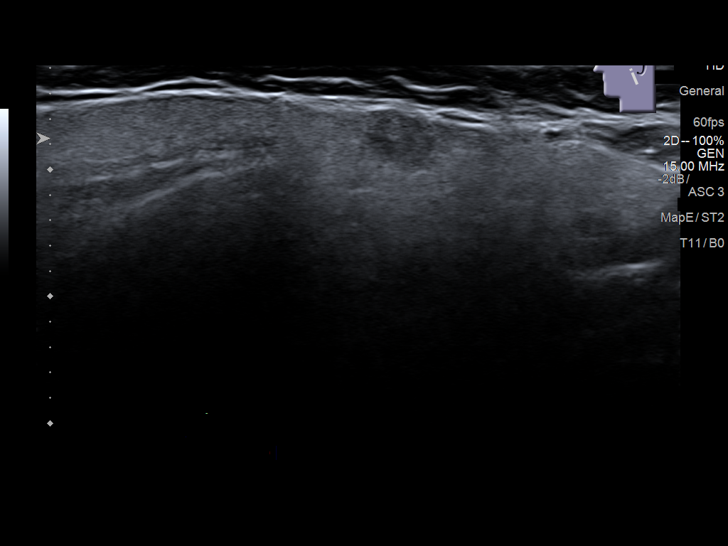
[im 2/14]
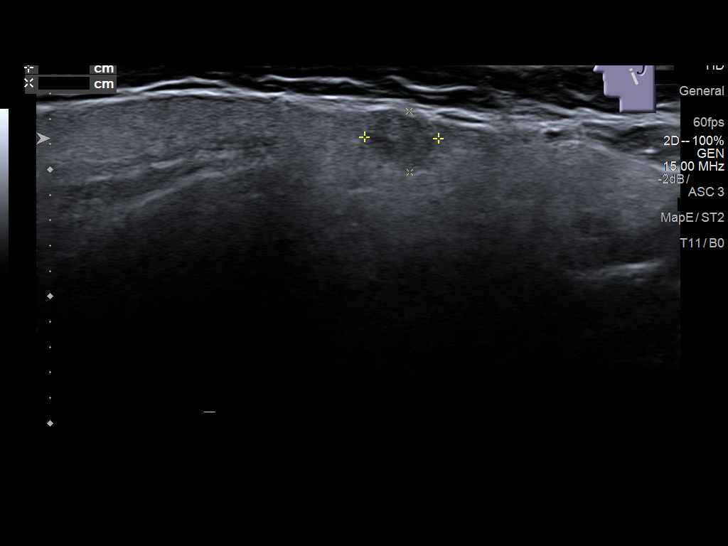
[im 3/14]
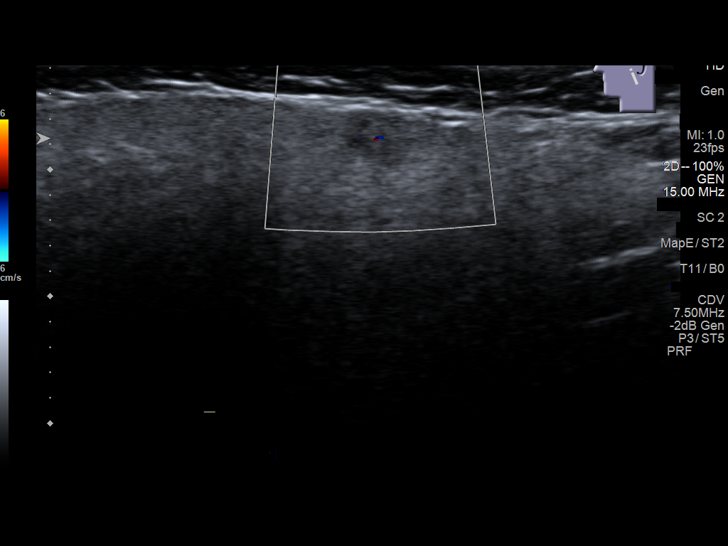
[im 4/14]
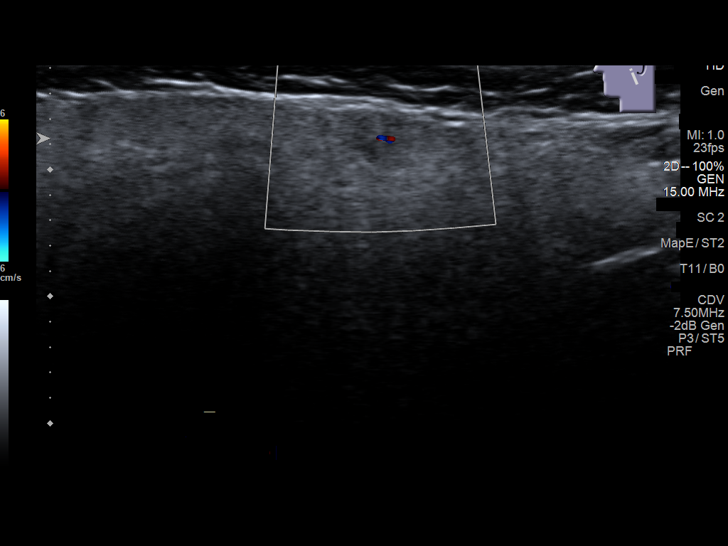
[im 5/14]
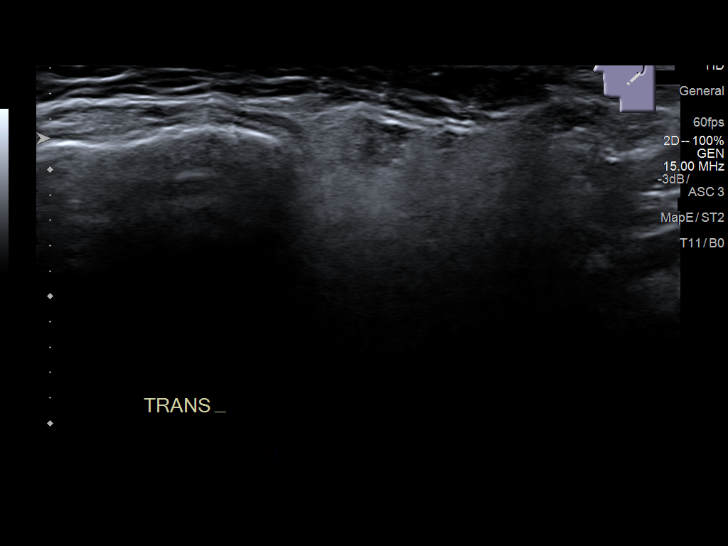
[im 6/14]
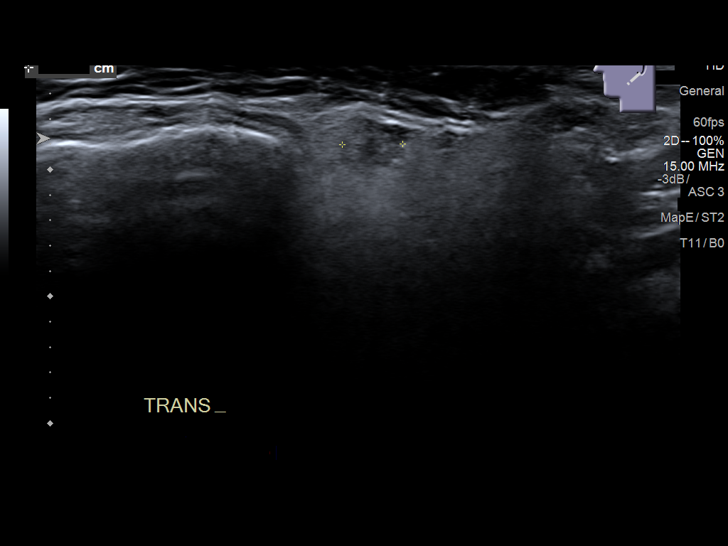
[im 7/14]
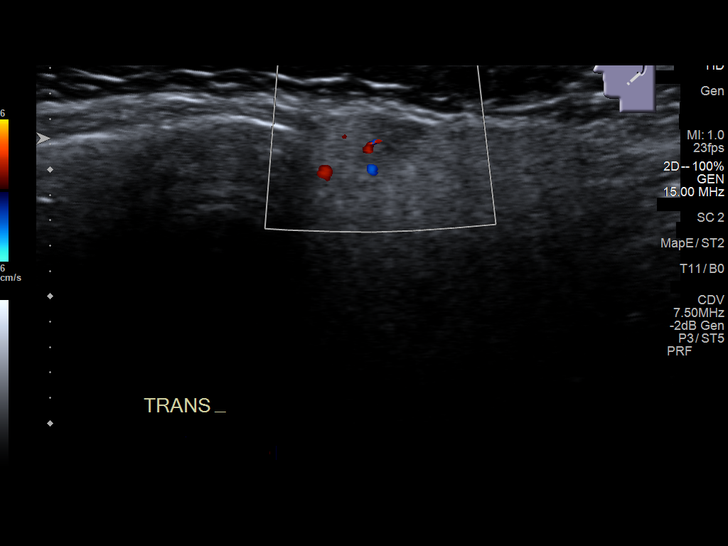
[im 8/14]
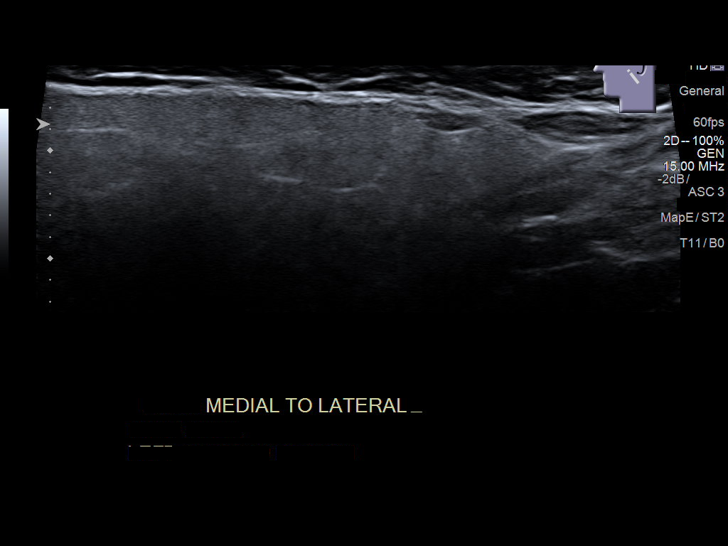
[im 9/14]
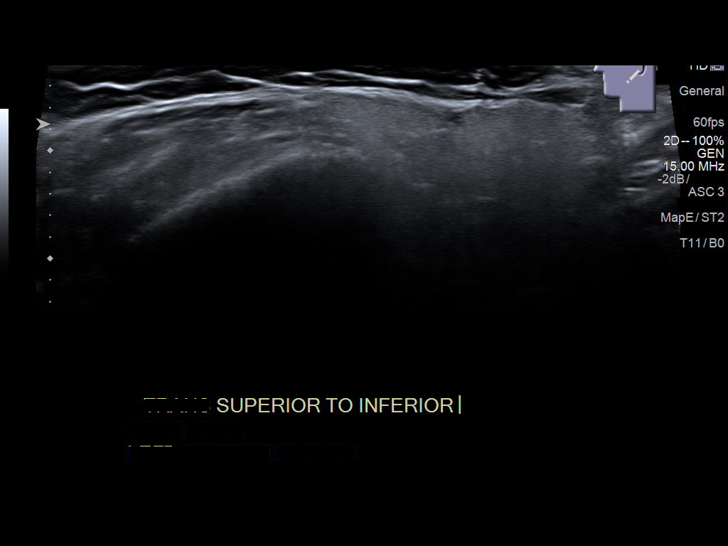
[im 10/14]
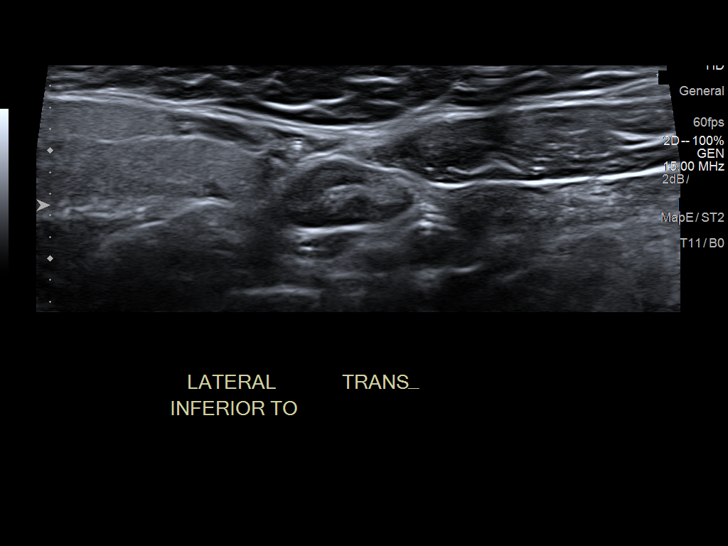
[im 11/14]
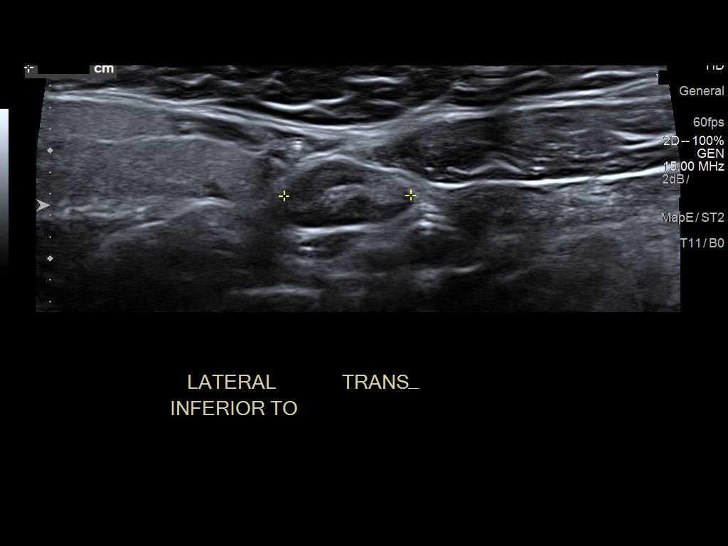
[im 12/14]
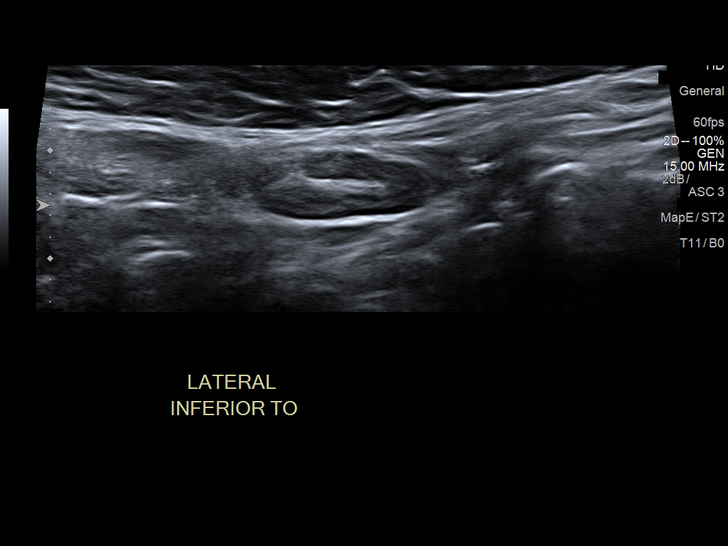
[im 13/14]
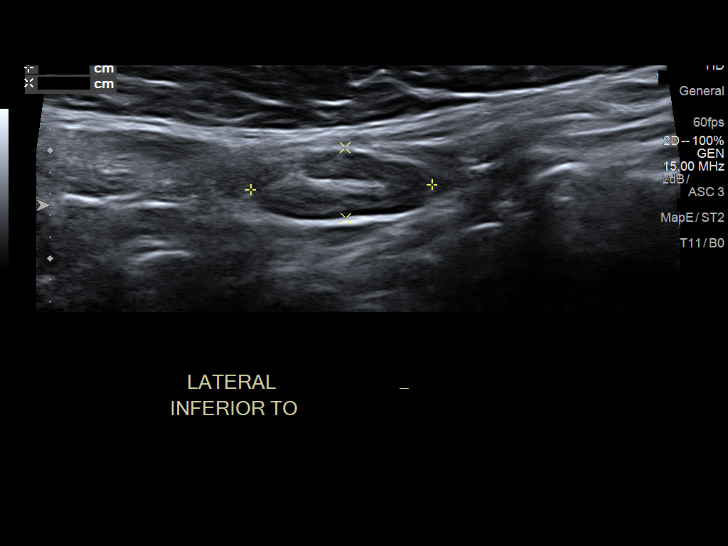
[im 14/14]
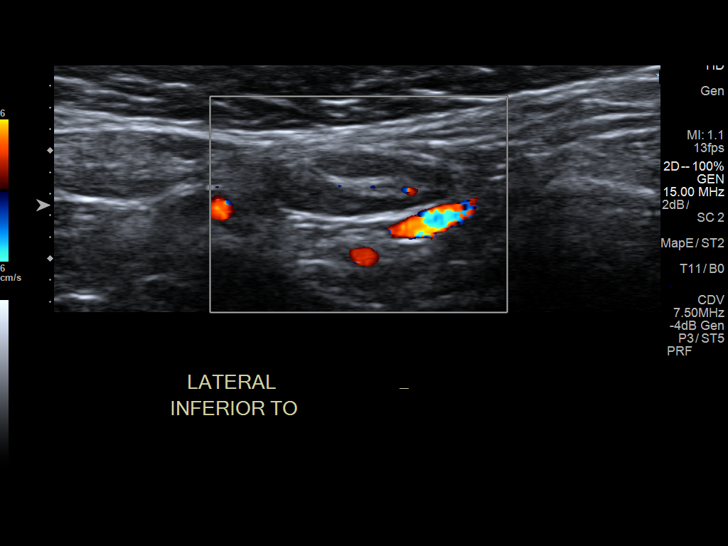

[14 of 14 positions shown; findings below may reference images not displayed]

FINDINGS: [HOSPITAL] the left parotid gland vicinity demonstrates a 6 x 5 x 5
mm hypoechoic mass. Parotid neoplasm cannot be excluded. Adjacent
subcentimeter short axis diameter lymph nodes are identified.
IMPRESSION: Sonographic [HOSPITAL] the palpable abnormality demonstrates a 6 mm
hypoechoic mass in the location of the parotid gland. CT neck with
contrast can be performed to further delineate pathology.

## 2018-04-03 DIAGNOSIS — R221 Localized swelling, mass and lump, neck: Secondary | ICD-10-CM | POA: Diagnosis not present

## 2018-05-27 ENCOUNTER — Other Ambulatory Visit: Payer: Self-pay | Admitting: Endocrinology

## 2018-05-27 DIAGNOSIS — E890 Postprocedural hypothyroidism: Secondary | ICD-10-CM

## 2018-05-27 DIAGNOSIS — E559 Vitamin D deficiency, unspecified: Secondary | ICD-10-CM

## 2018-05-28 ENCOUNTER — Other Ambulatory Visit (INDEPENDENT_AMBULATORY_CARE_PROVIDER_SITE_OTHER): Payer: PPO

## 2018-05-28 ENCOUNTER — Other Ambulatory Visit: Payer: Self-pay

## 2018-05-28 DIAGNOSIS — E559 Vitamin D deficiency, unspecified: Secondary | ICD-10-CM

## 2018-05-28 DIAGNOSIS — E890 Postprocedural hypothyroidism: Secondary | ICD-10-CM

## 2018-05-28 LAB — T4, FREE: Free T4: 1.02 ng/dL (ref 0.60–1.60)

## 2018-05-28 LAB — TSH: TSH: 1.66 u[IU]/mL (ref 0.35–4.50)

## 2018-05-28 LAB — VITAMIN D 25 HYDROXY (VIT D DEFICIENCY, FRACTURES): VITD: 30.67 ng/mL (ref 30.00–100.00)

## 2018-05-29 ENCOUNTER — Other Ambulatory Visit: Payer: PPO

## 2018-05-30 ENCOUNTER — Other Ambulatory Visit: Payer: PPO

## 2018-06-05 ENCOUNTER — Other Ambulatory Visit: Payer: Self-pay

## 2018-06-05 ENCOUNTER — Ambulatory Visit: Payer: PPO | Admitting: Endocrinology

## 2018-06-05 ENCOUNTER — Encounter: Payer: Self-pay | Admitting: Endocrinology

## 2018-06-05 VITALS — BP 140/72 | HR 75 | Ht 64.0 in | Wt 182.0 lb

## 2018-06-05 DIAGNOSIS — E890 Postprocedural hypothyroidism: Secondary | ICD-10-CM | POA: Diagnosis not present

## 2018-06-05 DIAGNOSIS — E559 Vitamin D deficiency, unspecified: Secondary | ICD-10-CM | POA: Diagnosis not present

## 2018-06-05 MED ORDER — SYNTHROID 112 MCG PO TABS: 112.0000 ug | ORAL_TABLET | Freq: Every day | ORAL | 3 refills | Status: DC

## 2018-06-05 NOTE — Patient Instructions (Signed)
Vitamin D3, 2000 units day

## 2018-06-05 NOTE — Progress Notes (Signed)
Patient ID: Melissa Cox, female   DOB: 1951/02/13, 68 y.o.   MRN: [id number]   Reason for Appointment:  Hypothyroidism, followup visit    History of Present Illness:   The hypothyroidism was first diagnosed in 1977 after treatment of her Graves' disease with [id number]  The patient has been treated with levothyroxine in varying doses, ranging from 100 up to 137 mcg  In 5/16 she had a high TSH level with using generic levothyroxine At that time she had been complaining of some fatigue and with increasing her dose to 112 g she started having a little better energy  She is taking brand name Synthroid  No unusual fatigue since her last visit and no cold intolerance  Previously had a high TSH in July 2018 and this was 9.7 however repeat level was normal Her dose of 112 mcg has been continued unchanged for some time She is quite regular with taking her thyroid supplement before breakfast           Not taking any calcium or iron supplements with the thyroid supplement  She does not complain of any fatigue or significant weight change    Her TSH level is again normal   Lab Results  Component Value Date   TSH 1.66 05/28/2018   TSH 1.12 11/24/2016   TSH 9.68 (H) 10/14/2016   FREET4 1.02 05/28/2018   FREET4 1.07 11/24/2016   FREET4 0.98 07/25/2016    Wt Readings from Last 3 Encounters:  06/05/18 182 lb (82.6 kg)  09/08/17 178 lb (80.7 kg)  09/04/17 180 lb 8 oz (81.9 kg)     Allergies as of 06/05/2018      Reactions   Demerol [meperidine] Rash   Diphenhydramine Hcl    REACTION: abd cramps and diarrhea   Doxycycline Nausea Only   Epinephrine Other (See Comments)   Increased heart rate    Meperidine Hcl    REACTION: rash   Prochlorperazine Edisylate    REACTION: increased heart rate- compazine       Medication List       Accurate as of June 05, 2018  1:31 PM. Always use your most recent med list.        magic mouthwash Soln SWISH 5 ML BY MOUTH ,HOLD AND  EXPECTORATE, REPEAT NO MORE THAN EVERY 4 HOURS   meclizine 25 MG tablet Commonly known as:  ANTIVERT TAKE ONE TABLET BY MOUTH EVERY 8 HOURS AS NEEDED FOR DIZZINESS   meloxicam 15 MG tablet Commonly known as:  MOBIC Take 1 tablet (15 mg total) by mouth daily.   Synthroid 112 MCG tablet Generic drug:  levothyroxine TAKE 1 TABLET (112 MCG TOTAL) BY MOUTH DAILY BEFORE BREAKFAST.       Allergies:  Allergies  Allergen Reactions  . Demerol [Meperidine] Rash  . Diphenhydramine Hcl     REACTION: abd cramps and diarrhea  . Doxycycline Nausea Only  . Epinephrine Other (See Comments)    Increased heart rate   . Meperidine Hcl     REACTION: rash  . Prochlorperazine Edisylate     REACTION: increased heart rate- compazine     Past Medical History:  Diagnosis Date  . Anemia    years ago - had hysterectomy to stop - hgb 5.2  . Arthritis    hands  . Blood transfusion without reported diagnosis    prior to hysterectomy  . Cataract    beginnings   . Diverticulitis 2018  . GERD (gastroesophageal reflux  disease)    occ -uses TUMS  . Lichen sclerosus   . 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 Unspecified essential hypertension 10/21/2008    Past Surgical History:  Procedure Laterality Date  . ABDOMINAL HYSTERECTOMY  3570,1779   partial, fibroids  . BREAST LUMPECTOMY     right,benign  . MENISCUS REPAIR Left     Family History  Problem Relation Age of Onset  . Goiter Mother   . Heart disease Father   . Hypothyroidism Sister   . Stomach cancer Sister   . Colon cancer Maternal Grandmother   . Colon polyps Neg Hx   . Esophageal cancer Neg Hx   . Rectal cancer Neg Hx     Social History:  reports that she has never smoked. She has never used smokeless tobacco. She reports current alcohol use. She reports that she does not use drugs.  REVIEW Of SYSTEMS:   Her baseline vitamin D level was 18 and has not  been taking her supplement again recently, Her current level is near 30 again Currently using 1000 units supplements   Her blood pressure Has been periodically high  BP Readings from Last 3 Encounters:  06/05/18 140/72  09/08/17 (!) 146/86  09/04/17 (!) 160/96     Examination:   BP 140/72 (BP Location: Left Arm, Patient Position: Sitting, Cuff Size: Normal)   Pulse 75   Ht 5\' 4"  (1.626 m)   Wt 182 lb (82.6 kg)   SpO2 96%   BMI 31.24 kg/m   She looks well Skin temperature is normal No tremor  Biceps reflexes are normal     Assessment   Hypothyroidism, post ablative, long-standing. Her TSH is consistently good since about 2016  Subjectively doing well and has been compliant with her supplement  She takes 112 g of brand name Synthroid and is willing to continue the brand-name  Vitamin D deficiency: Her level is only about 30 a.m. she is currently on 1000 units supplement  Patient is asking for Pneumovax which appears to be due and will defer to PCP since it is not available in our office today     Treatment:  She will continue brand name 112 g Synthroid with one tablet daily before breakfast  Needs to make regular follow-up visits   Increase vitamin D to 2000 units daily  Patient Instructions  Vitamin D3, 2000 units day    Elayne Snare 06/05/2018, 1:31 PM

## 2018-06-07 ENCOUNTER — Telehealth: Payer: Self-pay

## 2018-06-07 ENCOUNTER — Ambulatory Visit (INDEPENDENT_AMBULATORY_CARE_PROVIDER_SITE_OTHER): Payer: PPO

## 2018-06-07 ENCOUNTER — Other Ambulatory Visit: Payer: Self-pay

## 2018-06-07 DIAGNOSIS — Z23 Encounter for immunization: Secondary | ICD-10-CM | POA: Diagnosis not present

## 2018-06-07 NOTE — Telephone Encounter (Signed)
Left message for patient to call back so we can schedule her Annual Wellness Visit and Physical appointment with Dr. Lorelei Pont. Patient was aware that I would call back since computers were not working earlier in the day.

## 2018-06-07 NOTE — Progress Notes (Signed)
Patient received Pneumovax 23 today. Patient tolerated well.

## 2018-06-08 NOTE — Telephone Encounter (Signed)
Appointments have been made

## 2018-09-10 ENCOUNTER — Telehealth: Payer: Self-pay | Admitting: Family Medicine

## 2018-09-10 ENCOUNTER — Other Ambulatory Visit: Payer: Self-pay | Admitting: Family Medicine

## 2018-09-10 DIAGNOSIS — Z131 Encounter for screening for diabetes mellitus: Secondary | ICD-10-CM

## 2018-09-10 DIAGNOSIS — E890 Postprocedural hypothyroidism: Secondary | ICD-10-CM

## 2018-09-10 DIAGNOSIS — R5383 Other fatigue: Secondary | ICD-10-CM

## 2018-09-10 DIAGNOSIS — E785 Hyperlipidemia, unspecified: Secondary | ICD-10-CM

## 2018-09-10 NOTE — Telephone Encounter (Signed)
FLP, E78.5 hyperlipidemia Hemoglobin a1c: screening for diabetes, hyperglycemia Cbc with diff, HFP, BMET: R53.83 long-term medication usage

## 2018-09-10 NOTE — Telephone Encounter (Signed)
Please enter annual lab orders. Pt coming this Thursday 6.18.20  Thanks

## 2018-09-13 ENCOUNTER — Other Ambulatory Visit (INDEPENDENT_AMBULATORY_CARE_PROVIDER_SITE_OTHER): Payer: PPO

## 2018-09-13 ENCOUNTER — Ambulatory Visit (INDEPENDENT_AMBULATORY_CARE_PROVIDER_SITE_OTHER): Payer: PPO

## 2018-09-13 ENCOUNTER — Other Ambulatory Visit: Payer: Self-pay

## 2018-09-13 DIAGNOSIS — R5383 Other fatigue: Secondary | ICD-10-CM

## 2018-09-13 DIAGNOSIS — Z0000 Encounter for general adult medical examination without abnormal findings: Secondary | ICD-10-CM

## 2018-09-13 DIAGNOSIS — Z131 Encounter for screening for diabetes mellitus: Secondary | ICD-10-CM | POA: Diagnosis not present

## 2018-09-13 DIAGNOSIS — E785 Hyperlipidemia, unspecified: Secondary | ICD-10-CM | POA: Diagnosis not present

## 2018-09-13 DIAGNOSIS — E2839 Other primary ovarian failure: Secondary | ICD-10-CM | POA: Diagnosis not present

## 2018-09-13 DIAGNOSIS — Z1231 Encounter for screening mammogram for malignant neoplasm of breast: Secondary | ICD-10-CM | POA: Diagnosis not present

## 2018-09-13 LAB — CBC WITH DIFFERENTIAL/PLATELET
Basophils Absolute: 0 10*3/uL (ref 0.0–0.1)
Basophils Relative: 0.5 % (ref 0.0–3.0)
Eosinophils Absolute: 0.3 10*3/uL (ref 0.0–0.7)
Eosinophils Relative: 2.8 % (ref 0.0–5.0)
HCT: 41.8 % (ref 36.0–46.0)
Hemoglobin: 13.7 g/dL (ref 12.0–15.0)
Lymphocytes Relative: 30.5 % (ref 12.0–46.0)
Lymphs Abs: 3 10*3/uL (ref 0.7–4.0)
MCHC: 32.8 g/dL (ref 30.0–36.0)
MCV: 86.1 fl (ref 78.0–100.0)
Monocytes Absolute: 0.7 10*3/uL (ref 0.1–1.0)
Monocytes Relative: 7.1 % (ref 3.0–12.0)
Neutro Abs: 5.8 10*3/uL (ref 1.4–7.7)
Neutrophils Relative %: 59.1 % (ref 43.0–77.0)
Platelets: 299 10*3/uL (ref 150.0–400.0)
RBC: 4.85 Mil/uL (ref 3.87–5.11)
RDW: 14.3 % (ref 11.5–15.5)
WBC: 9.9 10*3/uL (ref 4.0–10.5)

## 2018-09-13 LAB — LIPID PANEL
Cholesterol: 224 mg/dL — ABNORMAL HIGH (ref 0–200)
HDL: 51.8 mg/dL (ref >39.00)
LDL Cholesterol: 138 mg/dL — ABNORMAL HIGH (ref 0–99)
NonHDL: 171.81
Total CHOL/HDL Ratio: 4
Triglycerides: 169 mg/dL — ABNORMAL HIGH (ref 0.0–149.0)
VLDL: 33.8 mg/dL (ref 0.0–40.0)

## 2018-09-13 LAB — HEPATIC FUNCTION PANEL
ALT: 21 U/L (ref 0–35)
AST: 17 U/L (ref 0–37)
Albumin: 4.2 g/dL (ref 3.5–5.2)
Alkaline Phosphatase: 71 U/L (ref 39–117)
Bilirubin, Direct: 0.1 mg/dL (ref 0.0–0.3)
Total Bilirubin: 0.3 mg/dL (ref 0.2–1.2)
Total Protein: 6.8 g/dL (ref 6.0–8.3)

## 2018-09-13 LAB — BASIC METABOLIC PANEL
BUN: 16 mg/dL (ref 6–23)
CO2: 28 mEq/L (ref 19–32)
Calcium: 9.6 mg/dL (ref 8.4–10.5)
Chloride: 105 mEq/L (ref 96–112)
Creatinine, Ser: 0.76 mg/dL (ref 0.40–1.20)
GFR: 75.58 mL/min (ref >60.00)
Glucose, Bld: 93 mg/dL (ref 70–99)
Potassium: 4.3 mEq/L (ref 3.5–5.1)
Sodium: 140 mEq/L (ref 135–145)

## 2018-09-13 LAB — HEMOGLOBIN A1C: Hgb A1c MFr Bld: 6.1 % (ref 4.6–6.5)

## 2018-09-13 NOTE — Patient Instructions (Signed)
Melissa Cox , Thank you for taking time to come for your Medicare Wellness Visit. I appreciate your ongoing commitment to your health goals. Please review the following plan we discussed and let me know if I can assist you in the future.   These are the goals we discussed: Goals    . Patient Stated     Starting 09/13/2018, I will continue to take medications as prescribed.        This is a list of the screening recommended for you and due dates:  Health Maintenance  Topic Date Due  . Mammogram  03/28/2019*  . DEXA scan (bone density measurement)  03/28/2019*  . Flu Shot  10/27/2018  . Cologuard (Stool DNA test)  05/12/2019  . Tetanus Vaccine  03/03/2026  .  Hepatitis C: One time screening is recommended by Center for Disease Control  (CDC) for  adults born from [birth year] through 1965.   Completed  . Pneumonia vaccines  Completed  *Topic was postponed. The date shown is not the original due date.   Preventive Care for Adults  A healthy lifestyle and preventive care can promote health and wellness. Preventive health guidelines for adults include the following key practices.  . A routine yearly physical is a good way to check with your health care provider about your health and preventive screening. It is a chance to share any concerns and updates on your health and to receive a thorough exam.  . Visit your dentist for a routine exam and preventive care every 6 months. Brush your teeth twice a day and floss once a day. Good oral hygiene prevents tooth decay and gum disease.  . The frequency of eye exams is based on your age, health, family medical history, use  of contact lenses, and other factors. Follow your health care provider's recommendations for frequency of eye exams.  . Eat a healthy diet. Foods like vegetables, fruits, whole grains, low-fat dairy products, and lean protein foods contain the nutrients you need without too many calories. Decrease your intake of foods high in solid  fats, added sugars, and salt. Eat the right amount of calories for you. Get information about a proper diet from your health care provider, if necessary.  . Regular physical exercise is one of the most important things you can do for your health. Most adults should get at least 150 minutes of moderate-intensity exercise (any activity that increases your heart rate and causes you to sweat) each week. In addition, most adults need muscle-strengthening exercises on 2 or more days a week.  Silver Sneakers may be a benefit available to you. To determine eligibility, you may visit the website: www.silversneakers.com or contact program at 951-280-6769 Mon-Fri between 8AM-8PM.   . Maintain a healthy weight. The body mass index (BMI) is a screening tool to identify possible weight problems. It provides an estimate of body fat based on height and weight. Your health care provider can find your BMI and can help you achieve or maintain a healthy weight.   For adults 20 years and older: ? A BMI below 18.5 is considered underweight. ? A BMI of 18.5 to 24.9 is normal. ? A BMI of 25 to 29.9 is considered overweight. ? A BMI of 30 and above is considered obese.   . Maintain normal blood lipids and cholesterol levels by exercising and minimizing your intake of saturated fat. Eat a balanced diet with plenty of fruit and vegetables. Blood tests for lipids and cholesterol should begin  at age 21 and be repeated every 5 years. If your lipid or cholesterol levels are high, you are over 50, or you are at high risk for heart disease, you may need your cholesterol levels checked more frequently. Ongoing high lipid and cholesterol levels should be treated with medicines if diet and exercise are not working.  . If you smoke, find out from your health care provider how to quit. If you do not use tobacco, please do not start.  . If you choose to drink alcohol, please do not consume more than 2 drinks per day. One drink is  considered to be 12 ounces (355 mL) of beer, 5 ounces (148 mL) of wine, or 1.5 ounces (44 mL) of liquor.  . If you are 78-55 years old, ask your health care provider if you should take aspirin to prevent strokes.  . Use sunscreen. Apply sunscreen liberally and repeatedly throughout the day. You should seek shade when your shadow is shorter than you. Protect yourself by wearing long sleeves, pants, a wide-brimmed hat, and sunglasses year round, whenever you are outdoors.  . Once a month, do a whole body skin exam, using a mirror to look at the skin on your back. Tell your health care provider of new moles, moles that have irregular borders, moles that are larger than a pencil eraser, or moles that have changed in shape or color.

## 2018-09-13 NOTE — Progress Notes (Signed)
Subjective:   Melissa Cox is a 68 y.o. female who presents for Medicare Annual (Subsequent) preventive examination.  Review of Systems:  N/A Cardiac Risk Factors include: advanced age (>78men, >67 women);dyslipidemia;obesity (BMI >30kg/m2)     Objective:     Vitals: There were no vitals taken for this visit.  There is no height or weight on file to calculate BMI.  Advanced Directives 09/13/2018 03/09/2017 11/29/2016  Does Patient Have a Medical Advance Directive? Yes Yes Yes  Type of Paramedic of [place];Living will [place];Living will [place];Living will  Does patient want to make changes to medical advance directive? No - Patient declined - -  Copy of [place] in Chart? No - copy requested No - copy requested -    Tobacco Social History   Tobacco Use  Smoking Status Never Smoker  Smokeless Tobacco Never Used     Counseling given: No   Clinical Intake:  Pre-visit preparation completed: Yes  Pain : No/denies pain Pain Score: 0-No pain     Nutritional Status: BMI 25 -29 Overweight Nutritional Risks: None Diabetes: No  How often do you need to have someone help you when you read instructions, pamphlets, or other written materials from your doctor or pharmacy?: 1 - Never What is the last grade level you completed in school?: Associate degree  Interpreter Needed?: No  Comments: pt lives with spouse Information entered by :: LPinson, LPN  Past Medical History:  Diagnosis Date  . Anemia    years ago - had hysterectomy to stop - hgb 5.2  . Arthritis    hands  . Blood transfusion without reported diagnosis    prior to hysterectomy  . Cataract    beginnings   . Diverticulitis 2018  . GERD (gastroesophageal reflux disease)    occ -uses TUMS  . Lichen sclerosus   . 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  Unspecified essential hypertension 10/21/2008   Past Surgical History:  Procedure Laterality Date  . ABDOMINAL HYSTERECTOMY  4235,[id number]   partial, fibroids  . BREAST LUMPECTOMY     right,benign  . MENISCUS REPAIR Left    Family History  Problem Relation Age of Onset  . Goiter Mother   . Heart disease Father   . Hypothyroidism Sister   . Stomach cancer Sister   . Colon cancer Maternal Grandmother   . Colon polyps Neg Hx   . Esophageal cancer Neg Hx   . Rectal cancer Neg Hx    Social History   Socioeconomic History  . Marital status: Married    Spouse name: Not on file  . Number of children: Not on file  . Years of education: Not on file  . Highest education level: Not on file  Occupational History  . Not on file  Social Needs  . Financial resource strain: Not on file  . Food insecurity    Worry: Not on file    Inability: Not on file  . Transportation needs    Medical: Not on file    Non-medical: Not on file  Tobacco Use  . Smoking status: Never Smoker  . Smokeless tobacco: Never Used  Substance and Sexual Activity  . Alcohol use: Yes    Comment: occ glass of wine  . Drug use: No  . Sexual activity: Not Currently  Lifestyle  . Physical activity    Days  per week: Not on file    Minutes per session: Not on file  . Stress: Not on file  Relationships  . Social Herbalist on phone: Not on file    Gets together: Not on file    Attends religious service: Not on file    Active member of club or organization: Not on file    Attends meetings of clubs or organizations: Not on file    Relationship status: Not on file  Other Topics Concern  . Not on file  Social History Narrative  . Not on file    Outpatient Encounter Medications as of 09/13/2018  Medication Sig  . Ascorbic Acid (VITAMIN C PO) Take 1,000 mg by mouth daily.  . Cholecalciferol (VITAMIN D3 PO) Take 1,000 Units by mouth daily.  . magic mouthwash SOLN SWISH 5 ML BY MOUTH ,HOLD AND EXPECTORATE,  REPEAT NO MORE THAN EVERY 4 HOURS  . meclizine (ANTIVERT) 25 MG tablet TAKE ONE TABLET BY MOUTH EVERY 8 HOURS AS NEEDED FOR DIZZINESS  . meloxicam (MOBIC) 15 MG tablet Take 1 tablet (15 mg total) by mouth daily.  Marland Kitchen SYNTHROID 112 MCG tablet Take 1 tablet (112 mcg total) by mouth daily before breakfast.   No facility-administered encounter medications on file as of 09/13/2018.     Activities of Daily Living In your present state of health, do you have any difficulty performing the following activities: 09/13/2018  Hearing? Y  Vision? N  Difficulty concentrating or making decisions? N  Walking or climbing stairs? N  Dressing or bathing? N  Doing errands, shopping? N  Preparing Food and eating ? N  Using the Toilet? N  In the past six months, have you accidently leaked urine? Y  Do you have problems with loss of bowel control? N  Managing your Medications? N  Managing your Finances? N  Housekeeping or managing your Housekeeping? N  Some recent data might be hidden    Patient Care Team: Owens Loffler, MD as PCP - General (Family Medicine)    Assessment:   This is a routine wellness examination for Melissa Cox.  No exam data present  Exercise Activities and Dietary recommendations Current Exercise Habits: The patient does not participate in regular exercise at present, Exercise limited by: None identified  Goals    . Patient Stated     Starting 09/13/2018, I will continue to take medications as prescribed.        Fall Risk Fall Risk  09/13/2018 03/09/2017 10/05/2016  Falls in the past year? 0 Yes Yes  Number falls in past yr: - 1 1  Injury with Fall? - Yes Yes   Depression Screen PHQ 2/9 Scores 09/13/2018 03/09/2017 10/05/2016  PHQ - 2 Score 0 0 0  PHQ- 9 Score 0 0 -     Cognitive Function MMSE - Mini Mental State Exam 09/13/2018 03/09/2017  Orientation to time 5 5  Orientation to Place 5 5  Registration 3 3  Attention/ Calculation 0 0  Recall 3 3  Language- name 2  objects 0 0  Language- repeat 1 1  Language- follow 3 step command 0 3  Language- read & follow direction 0 0  Write a sentence 0 0  Copy design 0 0  Total score 17 20     PLEASE NOTE: A Mini-Cog screen was completed. Maximum score is 17. A value of 0 denotes this part of Folstein MMSE was not completed or the patient failed this part of the  Mini-Cog screening.   Mini-Cog Screening Orientation to Time - Max 5 pts Orientation to Place - Max 5 pts Registration - Max 3 pts Recall - Max 3 pts Language Repeat - Max 1 pts      Immunization History  Administered Date(s) Administered  . Hepatitis A 10/30/2006  . Hepatitis B 05/25/2006, 10/30/2006, 10/21/2008  . Influenza Split 01/26/2012  . Influenza, High Dose Seasonal PF 01/28/2016, 01/17/2017, 02/05/2018  . Influenza,inj,Quad PF,6+ Mos 02/07/2014, 03/19/2015  . Pneumococcal Conjugate-13 06/09/2016  . Pneumococcal Polysaccharide-23 06/07/2018  . Td 02/21/2006  . Tdap 03/03/2016    Screening Tests Health Maintenance  Topic Date Due  . MAMMOGRAM  03/28/2019 (Originally 02/01/2018)  . DEXA SCAN  03/28/2019 (Originally 04/02/2015)  . INFLUENZA VACCINE  10/27/2018  . Fecal DNA (Cologuard)  05/12/2019  . TETANUS/TDAP  03/03/2026  . Hepatitis C Screening  Completed  . PNA vac Low Risk Adult  Completed       Plan:     I have personally reviewed, addressed, and noted the following in the patient's chart:  A. Medical and social history B. Use of alcohol, tobacco or illicit drugs  C. Current medications and supplements D. Functional ability and status E.  Nutritional status F.  Physical activity G. Advance directives H. List of other physicians I.  Hospitalizations, surgeries, and ER visits in previous 12 months J.  Vitals (unless it is a telemedicine encounter) K. Screenings to include cognitive, depression, hearing, vision (NOTE: hearing and vision screenings not completed in telemedicine encounter) L. Referrals and  appointments   In addition, I have reviewed and discussed with patient certain preventive protocols, quality metrics, and best practice recommendations. A written personalized care plan for preventive services and recommendations were provided to patient.  With patient's permission, we connected on 09/13/18 at 12:30 PM EDT. Interactive audio and video telecommunications were attempted with patient. This attempt was unsuccessful due to patient having technical difficulties OR patient did not have access to video capability.  Encounter was completed with audio only.  Two patient identifiers were used to ensure the encounter occurred with the correct person. Patient was in home and writer was in office.    Signed,   Lindell Noe, MHA, BS, RN Health Coach

## 2018-09-13 NOTE — Progress Notes (Signed)
PCP notes:   Health maintenance:  Mammogram - order generated and sent to PCP Bone density - order generated and sent to PCP  Abnormal screenings:   None  Patient concerns:   Patient is intermittent respiratory symptoms. Verbalizes she feels she has a sinus infection. Next day appt scheduled with Dr. Diona Browner.   Nurse concerns:  None  Next PCP appt:   09/17/18 @ 1120

## 2018-09-14 ENCOUNTER — Encounter: Payer: Self-pay | Admitting: Family Medicine

## 2018-09-14 ENCOUNTER — Ambulatory Visit (INDEPENDENT_AMBULATORY_CARE_PROVIDER_SITE_OTHER): Payer: PPO | Admitting: Family Medicine

## 2018-09-14 VITALS — Temp 97.4°F | Ht 64.0 in | Wt 180.0 lb

## 2018-09-14 DIAGNOSIS — J0100 Acute maxillary sinusitis, unspecified: Secondary | ICD-10-CM

## 2018-09-14 MED ORDER — AMOXICILLIN 500 MG PO CAPS: 1000.0000 mg | ORAL_CAPSULE | Freq: Two times a day (BID) | ORAL | 0 refills | Status: DC

## 2018-09-14 NOTE — Assessment & Plan Note (Signed)
>   2 weeks of symptoms, unilateral face pain, possible subejctive fever. No exposure to COVID19  Possible allergy precursor.. treat with nasal saline, nasal steroid and antihistamine.  Treat with antibiotics given likely bacterial superinfection.

## 2018-09-14 NOTE — Progress Notes (Signed)
VIRTUAL VISIT Due to national recommendations of social distancing due to [place] 19, a virtual visit is felt to be most appropriate for this patient at this time.   I connected with the patient on 09/14/18 at 10:40 AM EDT by virtual telehealth platform and verified that I am speaking with the correct person using two identifiers.   Interactive audio and video telecommunications were attempted between this provider and patient, however failed, due to patient having technical difficulties OR patient did not have access to video capability.  We continued and completed visit with audio only.   I discussed the limitations, risks, security and privacy concerns of performing an evaluation and management service by  virtual telehealth platform and the availability of in person appointments. I also discussed with the patient that there may be a patient responsible charge related to this service. The patient expressed understanding and agreed to proceed.  Patient location: Home Provider Location: [facility] [place] Participants: Melissa Cox and Melissa Cox   Chief Complaint  Patient presents with  . Sinus Drainage  . Headache    History of Present Illness: Sinusitis This is a new problem. The current episode started 1 to 4 weeks ago (2-3 weeks). The problem has been gradually worsening since onset. There has been no fever. The pain is moderate. Associated symptoms include congestion, coughing, headaches, a hoarse voice, sinus pressure, sneezing and a sore throat. Pertinent negatives include no chills, ear pain, neck pain or shortness of breath. (Post nasal drip  gums and teeth hurt, pain behind right eye and in maxillary area. Hoarse, sore throat on right Mild occ cough) Treatments tried: vit C, gargle. The treatment provided no relief.     COVID 19 screen No recent travel or known exposure to [place]. Not out of house much, did see son.  The importance of social distancing was discussed  today.   Review of Systems  Constitutional: Negative for chills.  HENT: Positive for congestion, hoarse voice, sinus pressure, sneezing and sore throat. Negative for ear pain.   Respiratory: Positive for cough. Negative for shortness of breath.   Musculoskeletal: Negative for neck pain.  Neurological: Positive for headaches.      Past Medical History:  Diagnosis Date  . Anemia    years ago - had hysterectomy to stop - hgb 5.2  . Arthritis    hands  . Blood transfusion without reported diagnosis    prior to hysterectomy  . Cataract    beginnings   . Diverticulitis 2018  . GERD (gastroesophageal reflux disease)    occ -uses TUMS  . Lichen sclerosus   . 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 Unspecified essential hypertension 10/21/2008    reports that she has never smoked. She has never used smokeless tobacco. She reports current alcohol use. She reports that she does not use drugs.   Current Outpatient Medications:  .  Ascorbic Acid (VITAMIN C PO), Take 1,000 mg by mouth daily., Disp: , Rfl:  .  Cholecalciferol (VITAMIN D3 PO), Take 1,000 Units by mouth daily., Disp: , Rfl:  .  clobetasol cream (TEMOVATE) 2.62 %, Apply 1 application topically 2 (two) times daily., Disp: , Rfl:  .  magic mouthwash SOLN, SWISH 5 ML BY MOUTH ,HOLD AND EXPECTORATE, REPEAT NO MORE THAN EVERY 4 HOURS, Disp: , Rfl: 0 .  meclizine (ANTIVERT) 25 MG tablet, TAKE ONE TABLET BY MOUTH EVERY 8 HOURS AS NEEDED FOR  DIZZINESS, Disp: 50 tablet, Rfl: 1 .  meloxicam (MOBIC) 15 MG tablet, Take 1 tablet (15 mg total) by mouth daily., Disp: 30 tablet, Rfl: 3 .  SYNTHROID 112 MCG tablet, Take 1 tablet (112 mcg total) by mouth daily before breakfast., Disp: 90 tablet, Rfl: 3   Observations/Objective: Temperature (!) 97.4 F (36.3 C), temperature source Oral, height 5\' 4"  (1.626 m), weight 180 lb (81.6 kg).  Physical Exam  Physical  Exam Constitutional:      General: The patient is not in acute distress. Pulmonary:     Effort: Pulmonary effort is normal. No respiratory distress.  Neurological:     Mental Status: The patient is alert and oriented to person, place, and time.  Psychiatric:        Mood and Affect: Mood normal.        Behavior: Behavior normal.   Assessment and Plan Acute non-recurrent maxillary sinusitis > 2 weeks of symptoms, unilateral face pain, possible subejctive fever. No exposure to COVID19  Possible allergy precursor.. treat with nasal saline, nasal steroid and antihistamine.  Treat with antibiotics given likely bacterial superinfection.     I discussed the assessment and treatment plan with the patient. The patient was provided an opportunity to ask questions and all were answered. The patient agreed with the plan and demonstrated an understanding of the instructions.   The patient was advised to call back or seek an in-person evaluation if the symptoms worsen or if the condition fails to improve as anticipated.     Melissa Lofts, MD

## 2018-09-14 NOTE — Patient Instructions (Addendum)
Start nasal saline spray.  Start flonase 2 sprays per nostril daily  Start antibiotics... amoxicillin 500 mg 2 tabs twice daily x 10 days.  Can try Claritin daily for allergies.  Call if not improving in 2 weeks.

## 2018-09-14 NOTE — Progress Notes (Signed)
I reviewed health advisor's note, was available for consultation, and agree with documentation and plan.  

## 2018-09-17 ENCOUNTER — Encounter: Payer: PPO | Admitting: Family Medicine

## 2018-10-21 NOTE — Progress Notes (Signed)
Melissa Borgen T. Chivonne Rascon, MD Primary [facility] and [place] at [facility] [place], [address] Phone: [phone]  FAX: Macy - 68 y.o. female  MRN [id number]  Date of Birth: 1950-07-20  Visit Date: 10/22/2018  PCP: Owens Loffler, MD  Referred by: Owens Loffler, MD  Chief Complaint  Patient presents with  . Annual Exam    Part 2   Patient Care Team: Owens Loffler, MD as PCP - General (Family Medicine) Subjective:   Melissa Cox is a 68 y.o. pleasant patient who presents with the following:  Health Maintenance Summary Reviewed and updated, unless pt declines services.  Tobacco History Reviewed. Non-smoker Alcohol: No concerns, no excessive use Exercise Habits: Some activity, rec at least 30 mins 5 times a week STD concerns: none Drug Use: None Birth control method: n/a Menses regular: no Lumps or breast concerns: no Breast Cancer Family History: no  Scripts - ointment  Rare benadryl Claritin - not much Did not get nose.  cathch in the throat. It will go away. Does not know what causes that.  Drainage off and on.    The 10-year ASCVD risk score Mikey Bussing DC Brooke Bonito., et al., 2013) is: 9.6%   Values used to calculate the score:     Age: 77 years     Sex: Female     Is Non-Hispanic African American: No     Diabetic: No     Tobacco smoker: No     Systolic Blood Pressure: 242 mmHg     Is BP treated: No     HDL Cholesterol: 51.8 mg/dL     Total Cholesterol: 224 mg/dL   Bone DEXA p  Health Maintenance  Topic Date Due  . MAMMOGRAM  03/28/2019 (Originally 02/01/2018)  . DEXA SCAN  03/28/2019 (Originally 04/02/2015)  . INFLUENZA VACCINE  10/27/2018  . Fecal DNA (Cologuard)  05/12/2019  . TETANUS/TDAP  03/03/2026  . Hepatitis C Screening  Completed  . PNA vac Low Risk Adult  Completed    Immunization History  Administered Date(s) Administered  . Hepatitis A 10/30/2006  . Hepatitis B  05/25/2006, 10/30/2006, 10/21/2008  . Influenza Split 01/26/2012  . Influenza, High Dose Seasonal PF 01/28/2016, 01/17/2017, 02/05/2018  . Influenza,inj,Quad PF,6+ Mos 02/07/2014, 03/19/2015  . Pneumococcal Conjugate-13 06/09/2016  . Pneumococcal Polysaccharide-23 06/07/2018  . Td 02/21/2006  . Tdap 03/03/2016   Patient Active Problem List   Diagnosis Date Noted  . Acute non-recurrent maxillary sinusitis 09/14/2018  . Acute bronchitis 09/08/2017  . Lymphadenitis 12/08/2016  . Pain of right heel 10/14/2016  . 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 Unspecified vitamin D deficiency 05/17/2013   Past Medical History:  Diagnosis Date  . Anemia    years ago - had hysterectomy to stop - hgb 5.2  . Arthritis    hands  . Blood transfusion without reported diagnosis    prior to hysterectomy  . Cataract    beginnings   . Diverticulitis 2018  . GERD (gastroesophageal reflux disease)    occ -uses TUMS  . Lichen sclerosus   . 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 Unspecified essential hypertension 10/21/2008   Past Surgical History:  Procedure Laterality Date  . ABDOMINAL HYSTERECTOMY  6834,1962   partial, fibroids  . BREAST LUMPECTOMY     right,benign  . MENISCUS REPAIR Left    Social History  Socioeconomic History  . Marital status: Married    Spouse name: Not on file  . Number of children: Not on file  . Years of education: Not on file  . Highest education level: Not on file  Occupational History  . Not on file  Social Needs  . Financial resource strain: Not on file  . Food insecurity    Worry: Not on file    Inability: Not on file  . Transportation needs    Medical: Not on file    Non-medical: Not on file  Tobacco Use  . Smoking status: Never Smoker  . Smokeless tobacco: Never Used  Substance and Sexual Activity  . Alcohol use: Yes    Comment: occ glass of wine  . Drug  use: No  . Sexual activity: Not Currently  Lifestyle  . Physical activity    Days per week: Not on file    Minutes per session: Not on file  . Stress: Not on file  Relationships  . Social Herbalist on phone: Not on file    Gets together: Not on file    Attends religious service: Not on file    Active member of club or organization: Not on file    Attends meetings of clubs or organizations: Not on file    Relationship status: Not on file  . Intimate partner violence    Fear of current or ex partner: Not on file    Emotionally abused: Not on file    Physically abused: Not on file    Forced sexual activity: Not on file  Other Topics Concern  . Not on file  Social History Narrative  . Not on file   Family History  Problem Relation Age of Onset  . Goiter Mother   . Heart disease Father   . Hypothyroidism Sister   . Stomach cancer Sister   . Colon cancer Maternal Grandmother   . Colon polyps Neg Hx   . Esophageal cancer Neg Hx   . Rectal cancer Neg Hx    Allergies  Allergen Reactions  . Demerol [Meperidine] Rash  . Diphenhydramine Hcl     REACTION: abd cramps and diarrhea  . Doxycycline Nausea Only  . Epinephrine Other (See Comments)    Increased heart rate   . Meperidine Hcl     REACTION: rash  . Prochlorperazine Edisylate     REACTION: increased heart rate- compazine     Medication list has been reviewed and updated.   General: Denies fever, chills, sweats. No significant weight loss. Eyes: Denies blurring,significant itching ENT: Denies earache, sore throat, and hoarseness.  Cardiovascular: Denies chest pains, palpitations, dyspnea on exertion,  Respiratory: Denies cough, dyspnea at rest,wheeezing Breast: no concerns about lumps GI: Denies nausea, vomiting, diarrhea, constipation, change in bowel habits, abdominal pain, melena, hematochezia GU: Denies dysuria, hematuria, urinary hesitancy, nocturia, denies STD risk, no concerns about discharge  Musculoskeletal: Denies back pain, joint pain Derm: Denies rash, itching Neuro: Denies  paresthesias, frequent falls, frequent headaches Psych: Denies depression, anxiety Endocrine: Denies cold intolerance, heat intolerance, polydipsia Heme: Denies enlarged lymph nodes Allergy: No hayfever  Objective:   BP 140/76   Pulse 82   Temp 98 F (36.7 C) (Temporal)   Ht 5\' 4"  (1.626 m)   Wt 177 lb 8 oz (80.5 kg)   BMI 30.47 kg/m  Ideal Body Weight: Weight in (lb) to have BMI = 25: 145.3 No exam data present Depression screen [facility] 2/9 09/13/2018 03/09/2017  10/05/2016  Decreased Interest 0 0 0  Down, Depressed, Hopeless 0 0 0  PHQ - 2 Score 0 0 0  Altered sleeping 0 0 -  Tired, decreased energy 0 0 -  Change in appetite 0 0 -  Feeling bad or failure about yourself  0 0 -  Trouble concentrating 0 0 -  Moving slowly or fidgety/restless 0 0 -  Suicidal thoughts 0 0 -  PHQ-9 Score 0 0 -  Difficult doing work/chores Not difficult at all Not difficult at all -     GEN: well developed, well nourished, no acute distress Eyes: conjunctiva and lids normal, PERRLA, EOMI ENT: TM clear, nares clear, oral exam WNL Neck: supple, no lymphadenopathy, no thyromegaly, no JVD Pulm: clear to auscultation and percussion, respiratory effort normal CV: regular rate and rhythm, S1-S2, no murmur, rub or gallop, no bruits Chest: no scars, masses, no lumps BREAST: breast exam declined GI: soft, non-tender; no hepatosplenomegaly, masses; active bowel sounds all quadrants GU: GU exam declined Lymph: no cervical, axillary or inguinal adenopathy MSK: gait normal, muscle tone and strength WNL, no joint swelling, effusions, discoloration, crepitus  SKIN: clear, good turgor, color WNL, no rashes, lesions, or ulcerations Neuro: normal mental status, normal strength, sensation, and motion Psych: alert; oriented to person, place and time, normally interactive and not anxious or depressed in appearance.   All labs  reviewed with patient. Results for orders placed or performed in visit on 09/13/18  Hemoglobin A1c  Result Value Ref Range   Hgb A1c MFr Bld 6.1 4.6 - 6.5 %  CBC with Differential/Platelet  Result Value Ref Range   WBC 9.9 4.0 - 10.5 K/uL   RBC 4.85 3.87 - 5.11 Mil/uL   Hemoglobin 13.7 12.0 - 15.0 g/dL   HCT 41.8 36.0 - 46.0 %   MCV 86.1 78.0 - 100.0 fl   MCHC 32.8 30.0 - 36.0 g/dL   RDW 14.3 11.5 - 15.5 %   Platelets 299.0 150.0 - 400.0 K/uL   Neutrophils Relative % 59.1 43.0 - 77.0 %   Lymphocytes Relative 30.5 12.0 - 46.0 %   Monocytes Relative 7.1 3.0 - 12.0 %   Eosinophils Relative 2.8 0.0 - 5.0 %   Basophils Relative 0.5 0.0 - 3.0 %   Neutro Abs 5.8 1.4 - 7.7 K/uL   Lymphs Abs 3.0 0.7 - 4.0 K/uL   Monocytes Absolute 0.7 0.1 - 1.0 K/uL   Eosinophils Absolute 0.3 0.0 - 0.7 K/uL   Basophils Absolute 0.0 0.0 - 0.1 K/uL  Basic metabolic panel  Result Value Ref Range   Sodium 140 135 - 145 mEq/L   Potassium 4.3 3.5 - 5.1 mEq/L   Chloride 105 96 - 112 mEq/L   CO2 28 19 - 32 mEq/L   Glucose, Bld 93 70 - 99 mg/dL   BUN 16 6 - 23 mg/dL   Creatinine, Ser 0.76 0.40 - 1.20 mg/dL   Calcium 9.6 8.4 - 10.5 mg/dL   GFR 75.58 >60.00 mL/min  Hepatic function panel  Result Value Ref Range   Total Bilirubin 0.3 0.2 - 1.2 mg/dL   Bilirubin, Direct 0.1 0.0 - 0.3 mg/dL   Alkaline Phosphatase 71 39 - 117 U/L   AST 17 0 - 37 U/L   ALT 21 0 - 35 U/L   Total Protein 6.8 6.0 - 8.3 g/dL   Albumin 4.2 3.5 - 5.2 g/dL  Lipid panel  Result Value Ref Range   Cholesterol 224 (H) 0 - 200  mg/dL   Triglycerides 169.0 (H) 0.0 - 149.0 mg/dL   HDL 51.80 >39.00 mg/dL   VLDL 33.8 0.0 - 40.0 mg/dL   LDL Cholesterol 138 (H) 0 - 99 mg/dL   Total CHOL/HDL Ratio 4    NonHDL 171.81    No results found.  Assessment and Plan:     ICD-10-CM   1. Healthcare maintenance  Z00.00    Declines statin rec - wants to try diet and exercise  Health Maintenance Exam: The patient's preventative maintenance and  recommended screening tests for an annual wellness exam were reviewed in full today. Brought up to date unless services declined.  Counselled on the importance of diet, exercise, and its role in overall health and mortality. The patient's FH and SH was reviewed, including their home life, tobacco status, and drug and alcohol status.  Follow-up in 1 year for physical exam or additional follow-up below.  Follow-up: No follow-ups on file. Or follow-up in 1 year if not noted.  Future Appointments  Date Time Provider Alexander  11/27/2018  1:00 PM [facility] MM 2 [facility] GI-BREAST CE  11/27/2018  1:30 PM [facility] DX DEXA 1 [facility]-BC[facility] GI[facility]BREAST CE  06/05/2019  8:30 AM [facility] LAB [facility] None  06/07/2019 10:45 AM Elayne Snare, MD [facility] None    Meds ordered this encounter  Medications  . clobetasol cream (TEMOVATE) 0.05 %    Sig: Apply 1 application topically 2 (two) times daily.    Dispense:  30 g    Refill:  1   Medications Discontinued During This Encounter  Medication Reason  . amoxicillin (AMOXIL) 500 MG capsule Completed Course  . clobetasol cream (TEMOVATE) 0.05 % Reorder   No orders of the defined types were placed in this encounter.   Signed,  Maud Deed. Hava Massingale, MD   Allergies as of 10/22/2018      Reactions   Demerol [meperidine] Rash   Diphenhydramine Hcl    REACTION: abd cramps and diarrhea   Doxycycline Nausea Only   Epinephrine Other (See Comments)   Increased heart rate    Meperidine Hcl    REACTION: rash   Prochlorperazine Edisylate    REACTION: increased heart rate- compazine       Medication List       Accurate as of October 22, 2018 12:40 PM. If you have any questions, ask your nurse or doctor.        STOP taking these medications   amoxicillin 500 MG capsule Commonly known as: AMOXIL Stopped by: Owens Loffler, MD     TAKE these medications   clobetasol cream 0.05 % Commonly known as: TEMOVATE Apply 1 application topically 2  (two) times daily.   magic mouthwash Soln SWISH 5 ML BY MOUTH ,HOLD AND EXPECTORATE, REPEAT NO MORE THAN EVERY 4 HOURS   meclizine 25 MG tablet Commonly known as: ANTIVERT TAKE ONE TABLET BY MOUTH EVERY 8 HOURS AS NEEDED FOR DIZZINESS   meloxicam 15 MG tablet Commonly known as: MOBIC Take 1 tablet (15 mg total) by mouth daily.   Synthroid 112 MCG tablet Generic drug: levothyroxine Take 1 tablet (112 mcg total) by mouth daily before breakfast.   VITAMIN C PO Take 1,000 mg by mouth daily.   VITAMIN D3 PO Take 1,000 Units by mouth daily.

## 2018-10-22 ENCOUNTER — Other Ambulatory Visit: Payer: Self-pay

## 2018-10-22 ENCOUNTER — Ambulatory Visit (INDEPENDENT_AMBULATORY_CARE_PROVIDER_SITE_OTHER): Payer: PPO | Admitting: Family Medicine

## 2018-10-22 VITALS — BP 140/76 | HR 82 | Temp 98.0°F | Ht 64.0 in | Wt 177.5 lb

## 2018-10-22 DIAGNOSIS — Z0000 Encounter for general adult medical examination without abnormal findings: Secondary | ICD-10-CM | POA: Diagnosis not present

## 2018-10-22 MED ORDER — CLOBETASOL PROPIONATE 0.05 % EX CREA: 1.0000 "application " | TOPICAL_CREAM | Freq: Two times a day (BID) | CUTANEOUS | 1 refills | Status: DC

## 2018-10-22 MED ORDER — TOLTERODINE TARTRATE ER 4 MG PO CP24: 4.0000 mg | ORAL_CAPSULE | Freq: Every day | ORAL | 3 refills | Status: DC

## 2018-10-22 MED ORDER — FLUTICASONE PROPIONATE 50 MCG/ACT NA SUSP: 2.0000 | Freq: Every day | NASAL | 3 refills | Status: DC

## 2018-10-22 NOTE — Patient Instructions (Addendum)
Start the Flonase 2 inhalations each nostril  Change the oral anti-histamine to [facility]

## 2018-10-23 ENCOUNTER — Encounter: Payer: Self-pay | Admitting: Family Medicine

## 2018-11-24 IMAGING — DX DG FOOT 2V*R*
2 series · 2 of 2 positions shown · non-contrast
Comparison: None.

CLINICAL DATA: Pain

EXAM:
RIGHT FOOT - 2 VIEW

[foot ap]
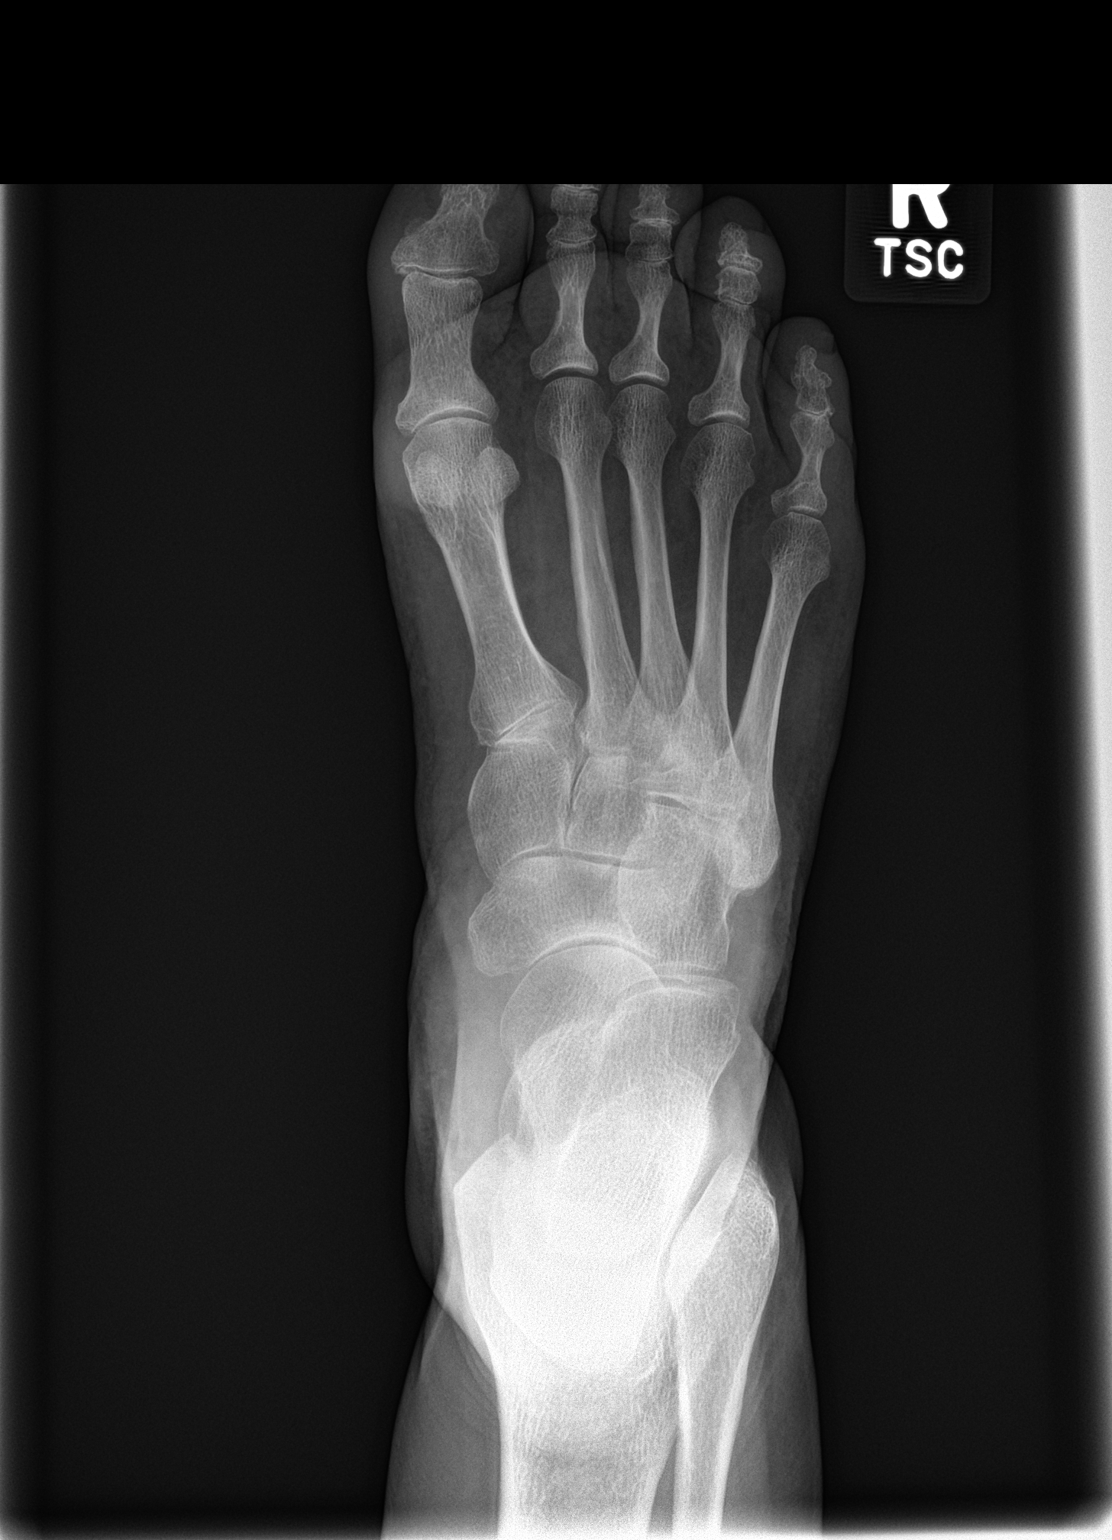

[foot lat]
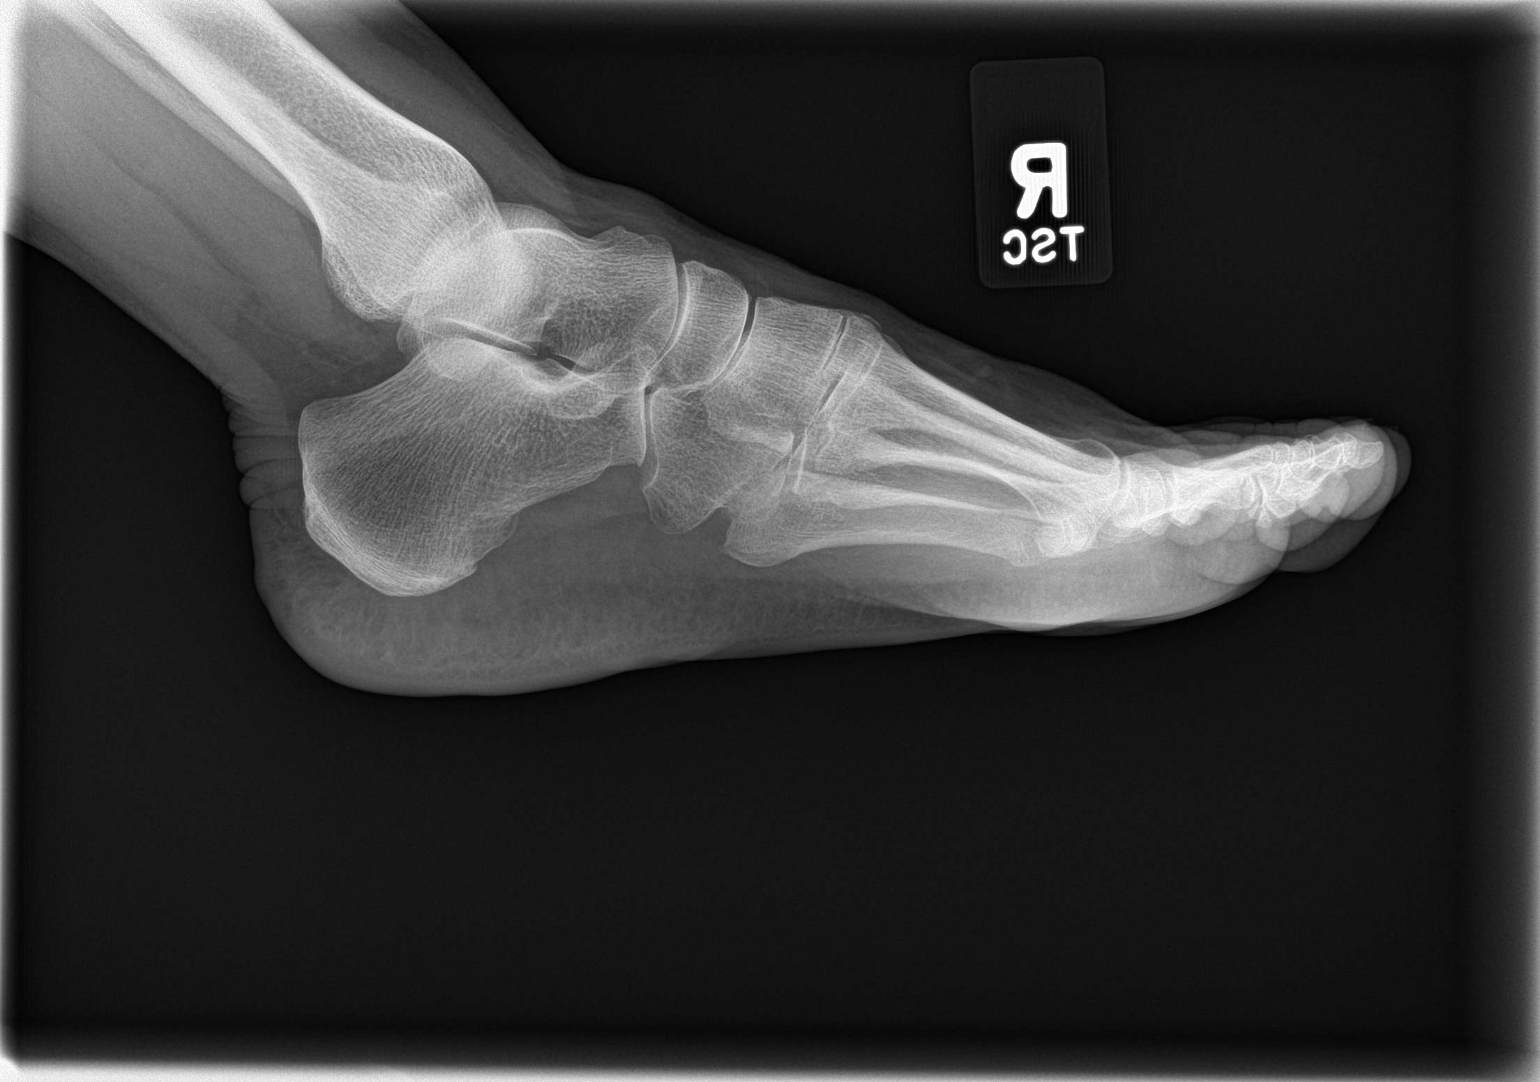

[2 of 2 positions shown; findings below may reference images not displayed]

FINDINGS: Frontal and lateral views were obtained. No fracture or dislocation.
There is mild narrowing of the first MTP and IP joints. There is
mild narrowing of all PIP and DIP joints. No erosive change.
IMPRESSION: Osteoarthritic change in multiple distal joints. No fracture or
dislocation.

## 2018-11-26 ENCOUNTER — Inpatient Hospital Stay
Admission: EM | Admit: 2018-11-26 | Discharge: 2018-11-28 | DRG: 392 | Disposition: A | Discharge status: Home / Self Care | Payer: PPO | Attending: Internal Medicine | Admitting: Internal Medicine

## 2018-11-26 ENCOUNTER — Ambulatory Visit
Admission: RE | Admit: 2018-11-26 | Discharge: 2018-11-26 | Disposition: A | Discharge status: Home / Self Care | Payer: PPO | Source: Ambulatory Visit | Attending: Family Medicine | Admitting: Family Medicine

## 2018-11-26 ENCOUNTER — Other Ambulatory Visit: Payer: Self-pay

## 2018-11-26 ENCOUNTER — Ambulatory Visit (INDEPENDENT_AMBULATORY_CARE_PROVIDER_SITE_OTHER): Payer: PPO | Admitting: Family Medicine

## 2018-11-26 ENCOUNTER — Encounter: Payer: Self-pay | Admitting: Family Medicine

## 2018-11-26 VITALS — BP 132/80 | HR 89 | Temp 97.9°F | Ht 64.0 in | Wt 172.5 lb

## 2018-11-26 DIAGNOSIS — Z888 Allergy status to other drugs, medicaments and biological substances status: Secondary | ICD-10-CM | POA: Diagnosis not present

## 2018-11-26 DIAGNOSIS — Z8349 Family history of other endocrine, nutritional and metabolic diseases: Secondary | ICD-10-CM

## 2018-11-26 DIAGNOSIS — K5720 Diverticulitis of large intestine with perforation and abscess without bleeding: Secondary | ICD-10-CM | POA: Diagnosis not present

## 2018-11-26 DIAGNOSIS — Z20828 Contact with and (suspected) exposure to other viral communicable diseases: Secondary | ICD-10-CM | POA: Diagnosis present

## 2018-11-26 DIAGNOSIS — K219 Gastro-esophageal reflux disease without esophagitis: Secondary | ICD-10-CM | POA: Diagnosis not present

## 2018-11-26 DIAGNOSIS — R1030 Lower abdominal pain, unspecified: Secondary | ICD-10-CM | POA: Insufficient documentation

## 2018-11-26 DIAGNOSIS — Z7951 Long term (current) use of inhaled steroids: Secondary | ICD-10-CM

## 2018-11-26 DIAGNOSIS — Z79899 Other long term (current) drug therapy: Secondary | ICD-10-CM | POA: Diagnosis not present

## 2018-11-26 DIAGNOSIS — E890 Postprocedural hypothyroidism: Secondary | ICD-10-CM

## 2018-11-26 DIAGNOSIS — Z79890 Hormone replacement therapy (postmenopausal): Secondary | ICD-10-CM

## 2018-11-26 DIAGNOSIS — D72829 Elevated white blood cell count, unspecified: Secondary | ICD-10-CM | POA: Diagnosis not present

## 2018-11-26 DIAGNOSIS — Z881 Allergy status to other antibiotic agents status: Secondary | ICD-10-CM

## 2018-11-26 DIAGNOSIS — Z8249 Family history of ischemic heart disease and other diseases of the circulatory system: Secondary | ICD-10-CM | POA: Diagnosis not present

## 2018-11-26 DIAGNOSIS — K5792 Diverticulitis of intestine, part unspecified, without perforation or abscess without bleeding: Secondary | ICD-10-CM | POA: Diagnosis not present

## 2018-11-26 DIAGNOSIS — Z8719 Personal history of other diseases of the digestive system: Secondary | ICD-10-CM | POA: Diagnosis not present

## 2018-11-26 DIAGNOSIS — Z90710 Acquired absence of both cervix and uterus: Secondary | ICD-10-CM | POA: Diagnosis not present

## 2018-11-26 DIAGNOSIS — Z885 Allergy status to narcotic agent status: Secondary | ICD-10-CM | POA: Diagnosis not present

## 2018-11-26 DIAGNOSIS — K5732 Diverticulitis of large intestine without perforation or abscess without bleeding: Secondary | ICD-10-CM | POA: Diagnosis not present

## 2018-11-26 DIAGNOSIS — I10 Essential (primary) hypertension: Secondary | ICD-10-CM | POA: Diagnosis present

## 2018-11-26 DIAGNOSIS — R1084 Generalized abdominal pain: Secondary | ICD-10-CM | POA: Diagnosis not present

## 2018-11-26 DIAGNOSIS — Z791 Long term (current) use of non-steroidal anti-inflammatories (NSAID): Secondary | ICD-10-CM

## 2018-11-26 DIAGNOSIS — Z800 Family history of malignant neoplasm of digestive organs: Secondary | ICD-10-CM | POA: Diagnosis not present

## 2018-11-26 DIAGNOSIS — Z03818 Encounter for observation for suspected exposure to other biological agents ruled out: Secondary | ICD-10-CM | POA: Diagnosis not present

## 2018-11-26 DIAGNOSIS — R109 Unspecified abdominal pain: Secondary | ICD-10-CM | POA: Diagnosis not present

## 2018-11-26 LAB — CBC WITH DIFFERENTIAL/PLATELET
Basophils Absolute: 0 10*3/uL (ref 0.0–0.1)
Basophils Relative: 0.3 % (ref 0.0–3.0)
Eosinophils Absolute: 0.1 10*3/uL (ref 0.0–0.7)
Eosinophils Relative: 0.5 % (ref 0.0–5.0)
HCT: 41.1 % (ref 36.0–46.0)
Hemoglobin: 13.7 g/dL (ref 12.0–15.0)
Lymphocytes Relative: 11.5 % — ABNORMAL LOW (ref 12.0–46.0)
Lymphs Abs: 2 10*3/uL (ref 0.7–4.0)
MCHC: 33.3 g/dL (ref 30.0–36.0)
MCV: 85.1 fl (ref 78.0–100.0)
Monocytes Absolute: 1.3 10*3/uL — ABNORMAL HIGH (ref 0.1–1.0)
Monocytes Relative: 7.5 % (ref 3.0–12.0)
Neutro Abs: 13.7 10*3/uL — ABNORMAL HIGH (ref 1.4–7.7)
Neutrophils Relative %: 80.2 % — ABNORMAL HIGH (ref 43.0–77.0)
Platelets: 282 10*3/uL (ref 150.0–400.0)
RBC: 4.83 Mil/uL (ref 3.87–5.11)
RDW: 14.5 % (ref 11.5–15.5)
WBC: 17.1 10*3/uL — ABNORMAL HIGH (ref 4.0–10.5)

## 2018-11-26 LAB — HEPATIC FUNCTION PANEL
ALT: 14 U/L (ref 0–35)
AST: 14 U/L (ref 0–37)
Albumin: 4.1 g/dL (ref 3.5–5.2)
Alkaline Phosphatase: 70 U/L (ref 39–117)
Bilirubin, Direct: 0.2 mg/dL (ref 0.0–0.3)
Total Bilirubin: 0.7 mg/dL (ref 0.2–1.2)
Total Protein: 7.3 g/dL (ref 6.0–8.3)

## 2018-11-26 LAB — COMPREHENSIVE METABOLIC PANEL
ALT: 16 U/L (ref 0–44)
AST: 18 U/L (ref 15–41)
Albumin: 4.1 g/dL (ref 3.5–5.0)
Alkaline Phosphatase: 72 U/L (ref 38–126)
Anion gap: 10 (ref 5–15)
BUN: 12 mg/dL (ref 8–23)
CO2: 24 mmol/L (ref 22–32)
Calcium: 9.2 mg/dL (ref 8.9–10.3)
Chloride: 101 mmol/L (ref 98–111)
Creatinine, Ser: 0.87 mg/dL (ref 0.44–1.00)
GFR calc Af Amer: >60 mL/min (ref >60)
GFR calc non Af Amer: >60 mL/min (ref >60)
Glucose, Bld: 113 mg/dL — ABNORMAL HIGH (ref 70–99)
Potassium: 3.6 mmol/L (ref 3.5–5.1)
Sodium: 135 mmol/L (ref 135–145)
Total Bilirubin: 0.8 mg/dL (ref 0.3–1.2)
Total Protein: 8.1 g/dL (ref 6.5–8.1)

## 2018-11-26 LAB — BASIC METABOLIC PANEL
BUN: 13 mg/dL (ref 6–23)
CO2: 26 mEq/L (ref 19–32)
Calcium: 9.5 mg/dL (ref 8.4–10.5)
Chloride: 103 mEq/L (ref 96–112)
Creatinine, Ser: 0.81 mg/dL (ref 0.40–1.20)
GFR: 70.18 mL/min (ref >60.00)
Glucose, Bld: 103 mg/dL — ABNORMAL HIGH (ref 70–99)
Potassium: 4 mEq/L (ref 3.5–5.1)
Sodium: 138 mEq/L (ref 135–145)

## 2018-11-26 LAB — URINALYSIS, COMPLETE (UACMP) WITH MICROSCOPIC
Bacteria, UA: NONE SEEN
Bilirubin Urine: NEGATIVE
Glucose, UA: NEGATIVE mg/dL
Ketones, ur: 20 mg/dL — AB
Leukocytes,Ua: NEGATIVE
Nitrite: NEGATIVE
Protein, ur: NEGATIVE mg/dL
Specific Gravity, Urine: >1.046 — ABNORMAL HIGH (ref 1.005–1.030)
pH: 5 (ref 5.0–8.0)

## 2018-11-26 LAB — POCT I-STAT CREATININE: Creatinine, Ser: 0.7 mg/dL (ref 0.44–1.00)

## 2018-11-26 LAB — LIPASE, BLOOD: Lipase: 22 U/L (ref 11–51)

## 2018-11-26 LAB — CBC
HCT: 41.5 % (ref 36.0–46.0)
Hemoglobin: 14 g/dL (ref 12.0–15.0)
MCH: 28.5 pg (ref 26.0–34.0)
MCHC: 33.7 g/dL (ref 30.0–36.0)
MCV: 84.3 fL (ref 80.0–100.0)
Platelets: 299 10*3/uL (ref 150–400)
RBC: 4.92 MIL/uL (ref 3.87–5.11)
RDW: 13.6 % (ref 11.5–15.5)
WBC: 18.9 10*3/uL — ABNORMAL HIGH (ref 4.0–10.5)
nRBC: 0 % (ref 0.0–0.2)

## 2018-11-26 LAB — T4, FREE: Free T4: 1.19 ng/dL (ref 0.60–1.60)

## 2018-11-26 LAB — TSH: TSH: 0.26 u[IU]/mL — ABNORMAL LOW (ref 0.35–4.50)

## 2018-11-26 LAB — LIPASE: Lipase: 8 U/L — ABNORMAL LOW (ref 11.0–59.0)

## 2018-11-26 LAB — T3, FREE: T3, Free: 2.7 pg/mL (ref 2.3–4.2)

## 2018-11-26 MED ORDER — ONDANSETRON HCL 4 MG PO TABS
4.0000 mg | ORAL_TABLET | Freq: Four times a day (QID) | ORAL | Status: DC | PRN
  Filled 2018-11-26: qty 1

## 2018-11-26 MED ORDER — TRAZODONE HCL 50 MG PO TABS
50.0000 mg | ORAL_TABLET | Freq: Every evening | ORAL | Status: DC | PRN
  Filled 2018-11-26: qty 1

## 2018-11-26 MED ORDER — METRONIDAZOLE IN NACL 5-0.79 MG/ML-% IV SOLN
500.0000 mg | Freq: Three times a day (TID) | INTRAVENOUS | Status: DC
  Administered 2018-11-26 – 2018-11-27 (×2): 500 mg via INTRAVENOUS
  Filled 2018-11-26 (×5): qty 100

## 2018-11-26 MED ORDER — IOHEXOL 300 MG/ML  SOLN
100.0000 mL | Freq: Once | INTRAMUSCULAR | Status: AC | PRN
  Administered 2018-11-26: 14:00:00 100 mL via INTRAVENOUS

## 2018-11-26 MED ORDER — ACETAMINOPHEN 500 MG PO TABS
1000.0000 mg | ORAL_TABLET | ORAL | Status: AC
  Administered 2018-11-26: 20:00:00 1000 mg via ORAL
  Filled 2018-11-26: qty 2

## 2018-11-26 MED ORDER — PIPERACILLIN-TAZOBACTAM 3.375 G IVPB 30 MIN: 3.3750 g | Freq: Four times a day (QID) | INTRAVENOUS | Status: DC

## 2018-11-26 MED ORDER — AMOXICILLIN-POT CLAVULANATE 875-125 MG PO TABS: 1.0000 | ORAL_TABLET | Freq: Two times a day (BID) | ORAL | 0 refills | Status: DC

## 2018-11-26 MED ORDER — ACETAMINOPHEN 650 MG RE SUPP
650.0000 mg | Freq: Four times a day (QID) | RECTAL | Status: DC | PRN
  Filled 2018-11-26: qty 1

## 2018-11-26 MED ORDER — ACETAMINOPHEN 325 MG PO TABS
650.0000 mg | ORAL_TABLET | Freq: Four times a day (QID) | ORAL | Status: DC | PRN
  Administered 2018-11-27 – 2018-11-28 (×2): 650 mg via ORAL
  Filled 2018-11-26 (×2): qty 2

## 2018-11-26 MED ORDER — SODIUM CHLORIDE 0.9 % IV BOLUS
500.0000 mL | Freq: Once | INTRAVENOUS | Status: AC
  Administered 2018-11-26: 20:00:00 500 mL via INTRAVENOUS

## 2018-11-26 MED ORDER — PIPERACILLIN-TAZOBACTAM 3.375 G IVPB
3.3750 g | Freq: Once | INTRAVENOUS | Status: AC
  Administered 2018-11-26: 20:00:00 3.375 g via INTRAVENOUS
  Filled 2018-11-26 (×2): qty 50

## 2018-11-26 MED ORDER — ONDANSETRON HCL 4 MG/2ML IJ SOLN: 4.0000 mg | Freq: Four times a day (QID) | INTRAMUSCULAR | Status: DC | PRN

## 2018-11-26 MED ORDER — SODIUM CHLORIDE 0.9% FLUSH
3.0000 mL | Freq: Once | INTRAVENOUS | Status: AC
  Administered 2018-11-26: 3 mL via INTRAVENOUS

## 2018-11-26 MED ORDER — ACETAMINOPHEN 500 MG PO TABS
ORAL_TABLET | ORAL | Status: AC
  Filled 2018-11-26: qty 1

## 2018-11-26 MED ORDER — SODIUM CHLORIDE 0.9 % IV SOLN
INTRAVENOUS | Status: DC
  Administered 2018-11-27: 04:00:00 via INTRAVENOUS

## 2018-11-26 NOTE — Progress Notes (Signed)
Melissa Danielski T. Raejean Swinford, MD Primary [facility] and [place] at [facility] [place], [address] Phone: [phone]  FAX: [place] - 68 y.o. female  MRN [id number]  Date of Birth: 08-07-1950  Visit Date: 11/26/2018  PCP: Owens Loffler, MD  Referred by: Owens Loffler, MD  Chief Complaint  Patient presents with  . Abdominal Pain  . Nausea  . Heart Flutter   Subjective:   Melissa Cox is a 68 y.o. very pleasant female patient who presents with the following:  She is a nice lady and she presents today with acute abdominal pain she rates her pain at 8 out of 10.  Generally she is a healthy lady.  She has abdominal pain, concentrated in the lower quadrants.  This is been escalating over the last 3 to 4 days.  She does have a known history of diverticulosis and has had diverticulitis in the past.  She is minimally been able to eat over the last few days.  This morning she was able to eat some broth.  She does not have a fever.  She has had some dark stools.  Recheck thyroid.  Other issues include rechecking her thyroid secondary to hypothyroidism.  Heart flutter?  She also describes a questionable history of intermittent fluttering, particular in the last few days when she has been sick.  abd pain, nausea  ? Diverticulitis, then worse over the weekend and hurt a lot. Whole abdomen hurting a lot.  Has not eaten at all the last few days.    Stool has been really dark the last few days  Divert vs peritoniti  Past Medical History, Surgical History, Social History, Family History, Problem List, Medications, and Allergies have been reviewed and updated if relevant.  Patient Active Problem List   Diagnosis Date Noted  . Lymphadenitis 12/08/2016  . 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 Unspecified vitamin D deficiency 05/17/2013    Past Medical History:  Diagnosis Date  . Anemia     years ago - had hysterectomy to stop - hgb 5.2  . Arthritis    hands  . Blood transfusion without reported diagnosis    prior to hysterectomy  . Cataract    beginnings   . Diverticulitis 2018  . GERD (gastroesophageal reflux disease)    occ -uses TUMS  . Lichen sclerosus   . 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 Unspecified essential hypertension 10/21/2008    Past Surgical History:  Procedure Laterality Date  . ABDOMINAL HYSTERECTOMY  [id number]   partial, fibroids  . BREAST LUMPECTOMY     right,benign  . MENISCUS REPAIR Left     Social History   Socioeconomic History  . Marital status: Married    Spouse name: Not on file  . Number of children: Not on file  . Years of education: Not on file  . Highest education level: Not on file  Occupational History  . Not on file  Social Needs  . Financial resource strain: Not on file  . Food insecurity    Worry: Not on file    Inability: Not on file  . Transportation needs    Medical: Not on file    Non-medical: Not on file  Tobacco Use  . Smoking status: Never Smoker  . Smokeless tobacco: Never Used  Substance and Sexual Activity  . Alcohol use: Yes  Comment: occ glass of wine  . Drug use: No  . Sexual activity: Not Currently  Lifestyle  . Physical activity    Days per week: Not on file    Minutes per session: Not on file  . Stress: Not on file  Relationships  . Social Herbalist on phone: Not on file    Gets together: Not on file    Attends religious service: Not on file    Active member of club or organization: Not on file    Attends meetings of clubs or organizations: Not on file    Relationship status: Not on file  . Intimate partner violence    Fear of current or ex partner: Not on file    Emotionally abused: Not on file    Physically abused: Not on file    Forced sexual activity: Not on file  Other Topics Concern  . Not on  file  Social History Narrative  . Not on file    Family History  Problem Relation Age of Onset  . Goiter Mother   . Heart disease Father   . Hypothyroidism Sister   . Stomach cancer Sister   . Colon cancer Maternal Grandmother   . Colon polyps Neg Hx   . Esophageal cancer Neg Hx   . Rectal cancer Neg Hx     Allergies  Allergen Reactions  . Demerol [Meperidine] Rash  . Diphenhydramine Hcl     REACTION: abd cramps and diarrhea  . Doxycycline Nausea Only  . Epinephrine Other (See Comments)    Increased heart rate   . Meperidine Hcl     REACTION: rash  . Prochlorperazine Edisylate     REACTION: increased heart rate- compazine     Medication list reviewed and updated in full in [facility].   GEN: No acute illnesses, no fevers, chills. GI: As above.  Difficulty eating. Pulm: No SOB Interactive and getting along well at home. Otherwise, the pertinent positives and negatives are listed above and in the HPI, otherwise a full review of systems has been reviewed and is negative unless noted positive.   Objective:   BP 132/80   Pulse 89   Temp 97.9 F (36.6 C) (Temporal)   Ht 5\' 4"  (1.626 m)   Wt 172 lb 8 oz (78.2 kg)   SpO2 96%   BMI 29.61 kg/m   GEN: WDWN, NAD, Non-toxic, A & O x 3 HEENT: Atraumatic, Normocephalic. Neck supple. No masses, No LAD. Ears and Nose: No external deformity. CV: RRR, No M/G/R. No JVD. No thrill. No extra heart sounds. PULM: CTA B, no wheezes, crackles, rhonchi. No retractions. No resp. distress. No accessory muscle use. ABD: Mostly soft, tender relatively diffusely, worse in the hypogastric and left lower quadrants.  No distention.  Questionable rebound tenderness.  Positive bowel sounds.   EXTR: No c/c/e NEURO Normal gait.  PSYCH: Normally interactive. Conversant. Not depressed or anxious appearing.  Calm demeanor.   Laboratory and Imaging Data: Results for orders placed or performed in visit on [id number]  Basic metabolic panel   Result Value Ref Range   Sodium 138 135 - 145 mEq/L   Potassium 4.0 3.5 - 5.1 mEq/L   Chloride 103 96 - 112 mEq/L   CO2 26 19 - 32 mEq/L   Glucose, Bld 103 (H) 70 - 99 mg/dL   BUN 13 6 - 23 mg/dL   Creatinine, Ser 0.81 0.40 - 1.20 mg/dL   Calcium 9.5 8.4 -  10.5 mg/dL   GFR 70.18 >60.00 mL/min  CBC with Differential/Platelet  Result Value Ref Range   WBC 17.1 (H) 4.0 - 10.5 K/uL   RBC 4.83 3.87 - 5.11 Mil/uL   Hemoglobin 13.7 12.0 - 15.0 g/dL   HCT 41.1 36.0 - 46.0 %   MCV 85.1 78.0 - 100.0 fl   MCHC 33.3 30.0 - 36.0 g/dL   RDW 14.5 11.5 - 15.5 %   Platelets 282.0 150.0 - 400.0 K/uL   Neutrophils Relative % 80.2 (H) 43.0 - 77.0 %   Lymphocytes Relative 11.5 (L) 12.0 - 46.0 %   Monocytes Relative 7.5 3.0 - 12.0 %   Eosinophils Relative 0.5 0.0 - 5.0 %   Basophils Relative 0.3 0.0 - 3.0 %   Neutro Abs 13.7 (H) 1.4 - 7.7 K/uL   Lymphs Abs 2.0 0.7 - 4.0 K/uL   Monocytes Absolute 1.3 (H) 0.1 - 1.0 K/uL   Eosinophils Absolute 0.1 0.0 - 0.7 K/uL   Basophils Absolute 0.0 0.0 - 0.1 K/uL  Hepatic function panel  Result Value Ref Range   Total Bilirubin 0.7 0.2 - 1.2 mg/dL   Bilirubin, Direct 0.2 0.0 - 0.3 mg/dL   Alkaline Phosphatase 70 39 - 117 U/L   AST 14 0 - 37 U/L   ALT 14 0 - 35 U/L   Total Protein 7.3 6.0 - 8.3 g/dL   Albumin 4.1 3.5 - 5.2 g/dL  Lipase  Result Value Ref Range   Lipase 8.0 (L) 11.0 - 59.0 U/L  TSH  Result Value Ref Range   TSH 0.26 (L) 0.35 - 4.50 uIU/mL  T3, free  Result Value Ref Range   T3, Free 2.7 2.3 - 4.2 pg/mL  T4, free  Result Value Ref Range   Free T4 1.19 0.60 - 1.60 ng/dL    Ct Abdomen Pelvis W Contrast  Result Date: 11/26/2018 CLINICAL DATA:  Abdominal pain, bloating, and nausea. EXAM: CT ABDOMEN AND PELVIS WITH CONTRAST TECHNIQUE: Multidetector CT imaging of the abdomen and pelvis was performed using the standard protocol following bolus administration of intravenous contrast. CONTRAST:  135mL OMNIPAQUE IOHEXOL 300 MG/ML  SOLN  COMPARISON:  10/27/2016 FINDINGS: Lower chest: No acute abnormality. Hepatobiliary: Multiple benign-appearing cysts in the liver with the largest being 2.7 cm in the right lobe. These have minimally progressed since 2018. Liver parenchyma is otherwise normal. Biliary tree is normal. Pancreas: Unremarkable. No pancreatic ductal dilatation or surrounding inflammatory changes. Spleen: Normal in size without focal abnormality. Adrenals/Urinary Tract: Adrenal glands are normal. Renal parenchyma is normal bilaterally. The patient has extensive bilateral peripelvic cysts. No hydronephrosis. Bladder is normal. Stomach/Bowel: Acute diverticulitis of the proximal sigmoid portion of the colon with pericolonic soft tissue stranding and mucosal thickening. There are 2 tiny air bubbles adjacent to the inflamed diverticulum which could represent micro perforation. Numerous diverticula in the left side of the colon. Vascular/Lymphatic: Aortic atherosclerosis. No enlarged abdominal or pelvic lymph nodes. Reproductive: Status post hysterectomy. No adnexal masses. Other: No abdominal wall hernia or abnormality. No abdominopelvic ascites. Musculoskeletal: No acute abnormality. Degenerative facet arthritis in the lower lumbar spine. IMPRESSION: 1. Acute diverticulitis of the proximal sigmoid portion of the colon with 2 tiny air bubbles adjacent to the inflamed diverticulum which could represent micro perforation. 2. Extensive diverticulosis of the left side of the colon. Aortic Atherosclerosis (ICD10-I70.0). Electronically Signed   By: Lorriane Shire M.D.   On: 11/26/2018 14:48     Assessment and Plan:     ICD-10-CM  1. Acute diverticulitis  K57.92   2. Generalized abdominal pain  0000000 Basic metabolic panel    CBC with Differential/Platelet    Hepatic function panel    Lipase  3. Hypothyroidism following radioiodine therapy  E89.0 TSH    T3, free    T4, free  4. Lower abdominal pain  R10.30 CT Abdomen Pelvis W Contrast    Level of Medical Decision-Making in this case is HIGH  Acute abdominal pain, 8 out of 10.  Diverticulitis on CT of the abdomen and pelvis.  Question of possible microperforation.  This was all communicated to the patient directly while in radiology.  Arranged for her to go to ER for further evaluation.  Potentially unstable situation.  At this point she has been seen by the ER and has had a surgical consult.  She is admitted to the hospital.  Other issues including hypothyroidism follow-up can be addressed in an outpatient setting.  Follow-up: No follow-ups on file.  Meds ordered this encounter  Medications  . amoxicillin-clavulanate (AUGMENTIN) 875-125 MG tablet    Sig: Take 1 tablet by mouth 2 (two) times daily for 10 days.    Dispense:  20 tablet    Refill:  0   Orders Placed This Encounter  Procedures  . CT Abdomen Pelvis W Contrast  . Basic metabolic panel  . CBC with Differential/Platelet  . Hepatic function panel  . Lipase  . TSH  . T3, free  . T4, free    Signed,  Xylon Croom T. [place], MD   No facility-administered encounter medications on file as of 11/26/2018.    Outpatient Encounter Medications as of 11/26/2018  Medication Sig  . Ascorbic Acid (VITAMIN C PO) Take 1,000 mg by mouth daily.  . Cholecalciferol (VITAMIN D3 PO) Take 1,000 Units by mouth daily.  . clobetasol cream (TEMOVATE) [id number] % Apply 1 application topically 2 (two) times daily.  . fluticasone (FLONASE) 50 MCG/ACT nasal spray Place 2 sprays into both nostrils daily.  . magic mouthwash SOLN SWISH 5 ML BY MOUTH ,HOLD AND EXPECTORATE, REPEAT NO MORE THAN EVERY 4 HOURS  . meclizine (ANTIVERT) 25 MG tablet TAKE ONE TABLET BY MOUTH EVERY 8 HOURS AS NEEDED FOR DIZZINESS  . meloxicam (MOBIC) 15 MG tablet Take 1 tablet (15 mg total) by mouth daily. (Patient not taking: Reported on 11/26/2018)  . SYNTHROID 112 MCG tablet Take 1 tablet (112 mcg total) by mouth daily before breakfast.  . tolterodine (DETROL LA)  4 MG 24 hr capsule Take 1 capsule (4 mg total) by mouth daily.  Marland Kitchen amoxicillin-clavulanate (AUGMENTIN) 875-125 MG tablet Take 1 tablet by mouth 2 (two) times daily for 10 days.

## 2018-11-26 NOTE — Consult Note (Signed)
PHARMACY -  BRIEF ANTIBIOTIC NOTE   Pharmacy has received consult(s) for intra-abdominal from an ED provider.  The patient's profile has been reviewed for ht/wt/allergies/indication/available labs.    One time order(s) placed for pip/tazo   Further antibiotics/pharmacy consults should be ordered by admitting physician if indicated.                       Thank you, Oswald Hillock 11/26/2018  7:42 PM

## 2018-11-26 NOTE — ED Triage Notes (Addendum)
Pt comes via POV from MD with c/o abdominal pain that started last Thursday. Pt states it got worse this weekend. Pt states her PCP told her she needed to come here after seeing her scan. Pt states she was told it showed diverticulitis.  Pt denies any SOB and CP. Pt states some nausea. Pt denies any urinary symptoms.

## 2018-11-26 NOTE — ED Notes (Signed)
ED TO INPATIENT HANDOFF REPORT  ED Nurse Name and Phone #: [phone]  S Name/Age/Gender Melissa Cox 68 y.o. female Room/Bed: ED32A/ED32A  Code Status   Code Status: Not on file  Home/SNF/Other Home A/Ox4 Is this baseline? Yes   Triage Complete: Triage complete  Chief Complaint Sent By MD/Abd Pain  Triage Note Pt comes via POV from MD with c/o abdominal pain that started last Thursday. Pt states it got worse this weekend. Pt states her PCP told her she needed to come here after seeing her scan. Pt states she was told it showed diverticulitis.  Pt denies any SOB and CP. Pt states some nausea. Pt denies any urinary symptoms.   Allergies Allergies  Allergen Reactions  . Demerol [Meperidine] Rash  . Diphenhydramine Hcl     REACTION: abd cramps and diarrhea  . Doxycycline Nausea Only  . Epinephrine Other (See Comments)    Increased heart rate   . Meperidine Hcl     REACTION: rash  . Prochlorperazine Edisylate     REACTION: increased heart rate- compazine     Level of Care/Admitting Diagnosis ED Disposition    ED Disposition Condition Comment   Admit  The patient appears reasonably stabilized for admission considering the current resources, flow, and capabilities available in the ED at this time, and I doubt any other [facility] requiring further screening and/or treatment in the ED prior to admission is  present.       B Medical/Surgery History Past Medical History:  Diagnosis Date  . Anemia    years ago - had hysterectomy to stop - hgb 5.2  . Arthritis    hands  . Blood transfusion without reported diagnosis    prior to hysterectomy  . Cataract    beginnings   . Diverticulitis 2018  . GERD (gastroesophageal reflux disease)    occ -uses TUMS  . Lichen sclerosus   . 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 Unspecified essential hypertension 10/21/2008   Past Surgical History:  Procedure  Laterality Date  . ABDOMINAL HYSTERECTOMY  [id number]   partial, fibroids  . BREAST LUMPECTOMY     right,benign  . MENISCUS REPAIR Left      A IV Location/Drains/Wounds Patient Lines/Drains/Airways Status   Active Line/Drains/Airways    Name:   Placement date:   Placement time:   Site:   Days:   Peripheral IV 11/26/18 Left Antecubital   11/26/18    1956    Antecubital   less than 1          Intake/Output Last 24 hours No intake or output data in the 24 hours ending 11/26/18 2001  Labs/Imaging Results for orders placed or performed during the hospital encounter of 11/26/18 (from the past 48 hour(s))  Lipase, blood     Status: None   Collection Time: 11/26/18  3:28 PM  Result Value Ref Range   Lipase 22 11 - 51 U/L    Comment: Performed at [facility], [address] [place], [place] [address]  Comprehensive metabolic panel     Status: Abnormal   Collection Time: 11/26/18  3:28 PM  Result Value Ref Range   Sodium 135 135 - 145 mmol/L   Potassium 3.6 3.5 - 5.1 mmol/L   Chloride 101 98 - 111 mmol/L   CO2 24 22 - 32 mmol/L   Glucose, Bld 113 (H) 70 - 99 mg/dL   BUN 12 8 -  23 mg/dL   Creatinine, Ser 0.87 0.44 - 1.00 mg/dL   Calcium 9.2 8.9 - 10.3 mg/dL   Total Protein 8.1 6.5 - 8.1 g/dL   Albumin 4.1 3.5 - 5.0 g/dL   AST 18 15 - 41 U/L   ALT 16 0 - 44 U/L   Alkaline Phosphatase 72 38 - 126 U/L   Total Bilirubin 0.8 0.3 - 1.2 mg/dL   GFR calc non Af Amer >60 >60 mL/min   GFR calc Af Amer >60 >60 mL/min   Anion gap 10 5 - 15    Comment: Performed at [facility], [address] [place], [place] [address]  CBC     Status: Abnormal   Collection Time: 11/26/18  3:28 PM  Result Value Ref Range   WBC 18.9 (H) 4.0 - 10.5 K/uL   RBC 4.92 3.87 - 5.11 MIL/uL   Hemoglobin 14.0 12.0 - 15.0 g/dL   HCT 41.5 36.0 - 46.0 %   MCV 84.3 80.0 - 100.0 fL   MCH 28.5 26.0 - 34.0 pg   MCHC 33.7 30.0 - 36.0 g/dL   RDW 13.6 11.5 - 15.5 %   Platelets 299 150 - 400  K/uL   nRBC 0.0 0.0 - 0.2 %    Comment: Performed at [facility], [address] [place], [place] [address]  Urinalysis, Complete w Microscopic     Status: Abnormal   Collection Time: 11/26/18  3:29 PM  Result Value Ref Range   Color, Urine YELLOW (A) YELLOW   APPearance CLEAR (A) CLEAR   Specific Gravity, Urine >1.046 (H) 1.005 - 1.030   pH 5.0 5.0 - 8.0   Glucose, UA NEGATIVE NEGATIVE mg/dL   Hgb urine dipstick SMALL (A) NEGATIVE   Bilirubin Urine NEGATIVE NEGATIVE   Ketones, ur 20 (A) NEGATIVE mg/dL   Protein, ur NEGATIVE NEGATIVE mg/dL   Nitrite NEGATIVE NEGATIVE   Leukocytes,Ua NEGATIVE NEGATIVE   RBC / HPF 0-5 0 - 5 RBC/hpf   WBC, UA 0-5 0 - 5 WBC/hpf   Bacteria, UA NONE SEEN NONE SEEN   Squamous Epithelial / LPF 0-5 0 - 5    Comment: Performed at [facility], [address] [place], [place] [address]   Ct Abdomen Pelvis W Contrast  Result Date: 11/26/2018 CLINICAL DATA:  Abdominal pain, bloating, and nausea. EXAM: CT ABDOMEN AND PELVIS WITH CONTRAST TECHNIQUE: Multidetector CT imaging of the abdomen and pelvis was performed using the standard protocol following bolus administration of intravenous contrast. CONTRAST:  181mL OMNIPAQUE IOHEXOL 300 MG/ML  SOLN COMPARISON:  10/27/2016 FINDINGS: Lower chest: No acute abnormality. Hepatobiliary: Multiple benign-appearing cysts in the liver with the largest being 2.7 cm in the right lobe. These have minimally progressed since 2018. Liver parenchyma is otherwise normal. Biliary tree is normal. Pancreas: Unremarkable. No pancreatic ductal dilatation or surrounding inflammatory changes. Spleen: Normal in size without focal abnormality. Adrenals/Urinary Tract: Adrenal glands are normal. Renal parenchyma is normal bilaterally. The patient has extensive bilateral peripelvic cysts. No hydronephrosis. Bladder is normal. Stomach/Bowel: Acute diverticulitis of the proximal sigmoid portion of the colon with pericolonic soft  tissue stranding and mucosal thickening. There are 2 tiny air bubbles adjacent to the inflamed diverticulum which could represent micro perforation. Numerous diverticula in the left side of the colon. Vascular/Lymphatic: Aortic atherosclerosis. No enlarged abdominal or pelvic lymph nodes. Reproductive: Status post hysterectomy. No adnexal masses. Other: No abdominal wall hernia or abnormality. No abdominopelvic ascites. Musculoskeletal: No acute abnormality. Degenerative facet  arthritis in the lower lumbar spine. IMPRESSION: 1. Acute diverticulitis of the proximal sigmoid portion of the colon with 2 tiny air bubbles adjacent to the inflamed diverticulum which could represent micro perforation. 2. Extensive diverticulosis of the left side of the colon. Aortic Atherosclerosis (ICD10-I70.0). Electronically Signed   By: Lorriane Shire M.D.   On: 11/26/2018 14:48    Pending Labs Unresulted Labs (From admission, onward)    Start     Ordered   11/26/18 1942  SARS CORONAVIRUS 2 (TAT 6-24 HRS) Nasopharyngeal Nasopharyngeal Swab  (Asymptomatic/Tier 2 Patients Labs)  ONCE - STAT,   STAT    Question Answer Comment  Is this test for diagnosis or screening Screening   Symptomatic for COVID-19 as defined by CDC No   Hospitalized for COVID-19 No   Admitted to ICU for COVID-19 No   Previously tested for COVID-19 No   Resident in a congregate (group) care setting No   Employed in healthcare setting No   Pregnant No      11/26/18 1942          Vitals/Pain Today's Vitals   11/26/18 1525 11/26/18 1915  BP: (!) 174/81   Pulse: 100   Resp: 18   Temp: 98 F (36.7 C)   SpO2: 100%   PainSc: 2  8     Isolation Precautions No active isolations  Medications Medications  sodium chloride flush (NS) 0.9 % injection 3 mL (has no administration in time range)  piperacillin-tazobactam (ZOSYN) IVPB 3.375 g (has no administration in time range)  acetaminophen (TYLENOL) tablet 1,000 mg (has no administration in  time range)  sodium chloride 0.9 % bolus 500 mL (500 mLs Intravenous New Bag/Given 11/26/18 2001)    Mobility walks with device Low fall risk      R Recommendations: See Admitting Provider Note  Report given to:   Additional Notes:

## 2018-11-26 NOTE — ED Provider Notes (Signed)
[facility] Emergency Department Provider Note   ____________________________________________   First MD Initiated Contact with Patient 11/26/18 1933     (approximate)  I have reviewed the triage vital signs and the nursing notes.   HISTORY  Chief Complaint Abdominal Pain    HPI Melissa Cox is a 68 y.o. female experiencing lower abdominal pain for about 4 to 5 days  She is had some mild nausea.  She is also had significant and increasing pain especially in her lower abdomen on the left.  No vomiting.  No noted fever.  Saw her doctor today, she had a CT scan and he told her to come to receive IV antibiotics and needed to be admitted to the hospital.  She reports she was diagnosed with "diverticulitis"  Patient reports only mild pain at this time, primarily located in the lower abdomen on the left.  She has had previous diverticulitis treated with antibiotics in the past.  Denies previous colonic surgery  No exposure to coronavirus.  No cough no runny nose.   Past Medical History:  Diagnosis Date  . Anemia    years ago - had hysterectomy to stop - hgb 5.2  . Arthritis    hands  . Blood transfusion without reported diagnosis    prior to hysterectomy  . Cataract    beginnings   . Diverticulitis 2018  . GERD (gastroesophageal reflux disease)    occ -uses TUMS  . Lichen sclerosus   . 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 Unspecified essential hypertension 10/21/2008    Patient Active Problem List   Diagnosis Date Noted  . Lymphadenitis 12/08/2016  . 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 Unspecified vitamin D deficiency 05/17/2013    Past Surgical History:  Procedure Laterality Date  . ABDOMINAL HYSTERECTOMY  [id number]   partial, fibroids  . BREAST LUMPECTOMY     right,benign  . MENISCUS REPAIR Left     Prior to Admission medications    Medication Sig Start Date End Date Taking? Authorizing Provider  amoxicillin-clavulanate (AUGMENTIN) 875-125 MG tablet Take 1 tablet by mouth 2 (two) times daily for 10 days. 11/26/18 12/06/18  Copland, Frederico Hamman, MD  Ascorbic Acid (VITAMIN C PO) Take 1,000 mg by mouth daily.    [provider]  Cholecalciferol (VITAMIN D3 PO) Take 1,000 Units by mouth daily.    [provider]  clobetasol cream (TEMOVATE) AB-123456789 % Apply 1 application topically 2 (two) times daily. 10/22/18   Copland, Frederico Hamman, MD  fluticasone (FLONASE) 50 MCG/ACT nasal spray Place 2 sprays into both nostrils daily. 10/22/18   Copland, Frederico Hamman, MD  magic mouthwash SOLN SWISH 5 ML BY MOUTH ,HOLD AND EXPECTORATE, REPEAT NO MORE THAN EVERY 4 HOURS 08/08/17   [provider]  meclizine (ANTIVERT) 25 MG tablet TAKE ONE TABLET BY MOUTH EVERY 8 HOURS AS NEEDED FOR DIZZINESS 09/04/17   Copland, Frederico Hamman, MD  meloxicam (MOBIC) 15 MG tablet Take 1 tablet (15 mg total) by mouth daily. 04/17/17   Hyatt, Max T, DPM  SYNTHROID 112 MCG tablet Take 1 tablet (112 mcg total) by mouth daily before breakfast. 06/05/18   Elayne Snare, MD  tolterodine (DETROL LA) 4 MG 24 hr capsule Take 1 capsule (4 mg total) by mouth daily. 10/22/18   Copland, Frederico Hamman, MD    Allergies Demerol [meperidine], Diphenhydramine hcl, Doxycycline, Epinephrine, Meperidine hcl, and Prochlorperazine edisylate  Family History  Problem Relation  Age of Onset  . Goiter Mother   . Heart disease Father   . Hypothyroidism Sister   . Stomach cancer Sister   . Colon cancer Maternal Grandmother   . Colon polyps Neg Hx   . Esophageal cancer Neg Hx   . Rectal cancer Neg Hx     Social History Social History   Tobacco Use  . Smoking status: Never Smoker  . Smokeless tobacco: Never Used  Substance Use Topics  . Alcohol use: Yes    Comment: occ glass of wine  . Drug use: No    Review of Systems Constitutional: No fever/chills Eyes: No visual changes. ENT: No  sore throat. Cardiovascular: Denies chest pain. Respiratory: Denies shortness of breath. Gastrointestinal: See HPI.  Decreased oral intake Genitourinary: Negative for dysuria. Musculoskeletal: Negative for back pain. Skin: Negative for rash. Neurological: Negative for headaches, areas of focal weakness or numbness.  Patient specifically tells me she is not allergic to any penicillins  ____________________________________________   PHYSICAL EXAM:  VITAL SIGNS: ED Triage Vitals [11/26/18 1525]  Enc Vitals Group     BP (!) 174/81     Pulse Rate 100     Resp 18     Temp 98 F (36.7 C)     Temp src      SpO2 100 %     Weight      Height      Head Circumference      Peak Flow      Pain Score 2     Pain Loc      Pain Edu?      Excl. in [place]?     Constitutional: Alert and oriented. Well appearing and in no acute distress. Eyes: Conjunctivae are normal. Head: Atraumatic. Nose: No congestion/rhinnorhea. Mouth/Throat: Mucous membranes are moist. Neck: No stridor.  Cardiovascular: Normal rate, regular rhythm. Grossly normal heart sounds.  Good peripheral circulation. Respiratory: Normal respiratory effort.  No retractions. Lungs CTAB. Gastrointestinal: Soft and moderate tenderness, especially across the lower abdomen right lower quadrant. Musculoskeletal: No lower extremity tenderness nor edema. Neurologic:  Normal speech and language. No gross focal neurologic deficits are appreciated.  Skin:  Skin is warm, dry and intact. No rash noted. Psychiatric: Mood and affect are normal. Speech and behavior are normal.  ____________________________________________   LABS (all labs ordered are listed, but only abnormal results are displayed)  Labs Reviewed  COMPREHENSIVE METABOLIC PANEL - Abnormal; Notable for the following components:      Result Value   Glucose, Bld 113 (*)    All other components within normal limits  CBC - Abnormal; Notable for the following components:   WBC  18.9 (*)    All other components within normal limits  URINALYSIS, COMPLETE (UACMP) WITH MICROSCOPIC - Abnormal; Notable for the following components:   Color, Urine YELLOW (*)    APPearance CLEAR (*)    Specific Gravity, Urine >1.046 (*)    Hgb urine dipstick SMALL (*)    Ketones, ur 20 (*)    All other components within normal limits  SARS CORONAVIRUS 2 (TAT 6-24 HRS)  LIPASE, BLOOD   ____________________________________________  EKG   ____________________________________________  RADIOLOGY  Outpatient CT imaging reviewed ____________________________________________   PROCEDURES  Procedure(s) performed: None  Procedures  Critical Care performed: No  ____________________________________________   INITIAL IMPRESSION / ASSESSMENT AND PLAN / ED COURSE  Pertinent labs & imaging results that were available during my care of the patient were reviewed by me and considered  in my medical decision making (see chart for details).    Differential diagnosis includes but is not limited to, abdominal perforation, aortic dissection, cholecystitis, appendicitis, diverticulitis, colitis, esophagitis/gastritis, kidney stone, pyelonephritis, urinary tract infection, aortic aneurysm. All are considered in decision and treatment plan. Based upon the patient's presentation and risk factors, as well as review of her clinical imaging appears that she does have acute diverticulitis.  I have placed consultation to general surgery, Dr. Peyton Najjar due to concern for microperforation, additionally discussed with the hospitalist service who will admit the patient.  We will start her on IV antibiotics.  Currently does not wish for any strong pain medication only Tylenol and reports no nausea.  Patient agreeable with plan for admission  AMAANI HAROLDSEN was evaluated in Emergency Department on 11/26/2018 for the symptoms described in the history of present illness. She was evaluated in the context of the  global COVID-19 pandemic, which necessitated consideration that the patient might be at risk for infection with the SARS-CoV-2 virus that causes COVID-19. Institutional protocols and algorithms that pertain to the evaluation of patients at risk for COVID-19 are in a state of rapid change based on information released by regulatory bodies including the CDC and federal and state organizations. These policies and algorithms were followed during the patient's care in the ED.  Admission discussed with Dr. Anselm Jungling. Dr. Peyton Najjar seeing patient at bedside at 835p      ____________________________________________   FINAL CLINICAL IMPRESSION(S) / ED DIAGNOSES  Final diagnoses:  Acute diverticulitis of intestine        Note:  This document was prepared using Dragon voice recognition software and may include unintentional dictation errors       Delman Kitten, MD 11/26/18 2038

## 2018-11-26 NOTE — ED Notes (Signed)
Pt ambulatory to bathroom with a steady gait

## 2018-11-26 NOTE — H&P (Signed)
[facility] [place] NAME: Melissa Cox    MR#:  [id number]  [place]:  [birth year]  DATE OF ADMISSION:  11/26/2018  PRIMARY CARE PHYSICIAN: Owens Loffler, MD   REQUESTING/REFERRING PHYSICIAN: Derrell Lolling, MD  CHIEF COMPLAINT:   Chief Complaint  Patient presents with  . Abdominal Pain    HISTORY OF PRESENT ILLNESS:  Melissa Cox  is a 68 y.o. female with a known history of GERD, diverticulitis, hypertension.  She presented to the emergency room complaining of a 4 to 5-day history of lower abdominal pain described as primarily cramping however sharp at times with a pain score 6-8 out of 10.  She has noted some mild nausea at times however no vomiting.  She has noted no fevers or chills.  She denies chest pain or shortness of breath.  She was sent to the emergency room by her primary care physician with CT scan demonstrating acute diverticulitis with 2 microperforations.  CT findings were discussed with general surgery by the ED physician with recommendation for IV antibiotics for now.  On arrival to the emergency room WBC is 18.9 with urinalysis negative.  She has been admitted to the hospitalist service for further management.  PAST MEDICAL HISTORY:   Past Medical History:  Diagnosis Date  . Anemia    years ago - had hysterectomy to stop - hgb 5.2  . Arthritis    hands  . Blood transfusion without reported diagnosis    prior to hysterectomy  . Cataract    beginnings   . Diverticulitis 2018  . GERD (gastroesophageal reflux disease)    occ -uses TUMS  . Lichen sclerosus   . 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 Unspecified essential hypertension 10/21/2008    PAST SURGICAL HISTORY:   Past Surgical History:  Procedure Laterality Date  . ABDOMINAL HYSTERECTOMY  [id number]   partial, fibroids  . BREAST LUMPECTOMY     right,benign  . MENISCUS REPAIR  Left     SOCIAL HISTORY:   Social History   Tobacco Use  . Smoking status: Never Smoker  . Smokeless tobacco: Never Used  Substance Use Topics  . Alcohol use: Yes    Comment: occ glass of wine    FAMILY HISTORY:   Family History  Problem Relation Age of Onset  . Goiter Mother   . Heart disease Father   . Hypothyroidism Sister   . Stomach cancer Sister   . Colon cancer Maternal Grandmother   . Colon polyps Neg Hx   . Esophageal cancer Neg Hx   . Rectal cancer Neg Hx     DRUG ALLERGIES:   Allergies  Allergen Reactions  . Demerol [Meperidine] Rash  . Diphenhydramine Hcl     REACTION: abd cramps and diarrhea  . Doxycycline Nausea Only  . Epinephrine Other (See Comments)    Increased heart rate   . Meperidine Hcl     REACTION: rash  . Prochlorperazine Edisylate     REACTION: increased heart rate- compazine     REVIEW OF SYSTEMS:   Review of Systems  Constitutional: Positive for malaise/fatigue. Negative for chills and fever.  HENT: Negative for congestion and sore throat.   Eyes: Negative for blurred vision and double vision.  Respiratory: Negative for sputum production and shortness of breath.   Cardiovascular: Negative for chest pain and palpitations.  Gastrointestinal: Positive for abdominal pain. Negative  for blood in stool, constipation, diarrhea, heartburn, melena, nausea and vomiting.  Genitourinary: Negative for dysuria, flank pain and hematuria.  Musculoskeletal: Negative for falls and myalgias.  Skin: Negative for itching and rash.  Neurological: Negative for dizziness, weakness and headaches.  Psychiatric/Behavioral: Negative for depression.    MEDICATIONS AT HOME:   Prior to Admission medications   Medication Sig Start Date End Date Taking? Authorizing Provider  amoxicillin-clavulanate (AUGMENTIN) 875-125 MG tablet Take 1 tablet by mouth 2 (two) times daily for 10 days. 11/26/18 12/06/18  Copland, Frederico Hamman, MD  Ascorbic Acid (VITAMIN C PO) Take  1,000 mg by mouth daily.    [provider]  Cholecalciferol (VITAMIN D3 PO) Take 1,000 Units by mouth daily.    [provider]  clobetasol cream (TEMOVATE) [id number] % Apply 1 application topically 2 (two) times daily. 10/22/18   Copland, Frederico Hamman, MD  fluticasone (FLONASE) 50 MCG/ACT nasal spray Place 2 sprays into both nostrils daily. 10/22/18   Copland, Frederico Hamman, MD  magic mouthwash SOLN SWISH 5 ML BY MOUTH ,HOLD AND EXPECTORATE, REPEAT NO MORE THAN EVERY 4 HOURS 08/08/17   [provider]  meclizine (ANTIVERT) 25 MG tablet TAKE ONE TABLET BY MOUTH EVERY 8 HOURS AS NEEDED FOR DIZZINESS 09/04/17   Copland, Frederico Hamman, MD  meloxicam (MOBIC) 15 MG tablet Take 1 tablet (15 mg total) by mouth daily. 04/17/17   Hyatt, Max T, DPM  SYNTHROID 112 MCG tablet Take 1 tablet (112 mcg total) by mouth daily before breakfast. 06/05/18   Elayne Snare, MD  tolterodine (DETROL LA) 4 MG 24 hr capsule Take 1 capsule (4 mg total) by mouth daily. 10/22/18   Copland, Frederico Hamman, MD      VITAL SIGNS:  Blood pressure (!) 174/81, pulse 100, temperature 98 F (36.7 C), resp. rate 18, SpO2 100 %.  PHYSICAL EXAMINATION:  Physical Exam  GENERAL:  68 y.o.-year-old patient lying in the bed with no acute distress.  EYES: Pupils equal, round, reactive to light and accommodation. No scleral icterus. Extraocular muscles intact.  HEENT: Head atraumatic, normocephalic. Oropharynx and nasopharynx clear.  NECK:  Supple, no jugular venous distention. No thyroid enlargement, no tenderness.  LUNGS: Normal breath sounds bilaterally, no wheezing, rales,rhonchi or crepitation. No use of accessory muscles of respiration.  CARDIOVASCULAR: Regular rate and rhythm, S1, S2 normal. No murmurs, rubs, or gallops.  ABDOMEN: Soft, nondistended, right upper quadrant and lower abdominal tenderness. Bowel sounds present. No organomegaly or mass.  EXTREMITIES: No pedal edema, cyanosis, or clubbing.  NEUROLOGIC: Cranial nerves II through  XII are intact. Muscle strength 5/5 in all extremities. Sensation intact. Gait not checked.  PSYCHIATRIC: The patient is alert and oriented x 3.  Normal affect and good eye contact. SKIN: No obvious rash, lesion, or ulcer.   LABORATORY PANEL:   CBC Recent Labs  Lab 11/26/18 1528  WBC 18.9*  HGB 14.0  HCT 41.5  PLT 299   ------------------------------------------------------------------------------------------------------------------  Chemistries  Recent Labs  Lab 11/26/18 1528  NA 135  K 3.6  CL 101  CO2 24  GLUCOSE 113*  BUN 12  CREATININE 0.87  CALCIUM 9.2  AST 18  ALT 16  ALKPHOS 72  BILITOT 0.8   ------------------------------------------------------------------------------------------------------------------  Cardiac Enzymes No results for input(s): TROPONINI in the last 168 hours. ------------------------------------------------------------------------------------------------------------------  RADIOLOGY:  Ct Abdomen Pelvis W Contrast  Result Date: 11/26/2018 CLINICAL DATA:  Abdominal pain, bloating, and nausea. EXAM: CT ABDOMEN AND PELVIS WITH CONTRAST TECHNIQUE: Multidetector CT imaging of the abdomen and pelvis was  performed using the standard protocol following bolus administration of intravenous contrast. CONTRAST:  177mL OMNIPAQUE IOHEXOL 300 MG/ML  SOLN COMPARISON:  10/27/2016 FINDINGS: Lower chest: No acute abnormality. Hepatobiliary: Multiple benign-appearing cysts in the liver with the largest being 2.7 cm in the right lobe. These have minimally progressed since 2018. Liver parenchyma is otherwise normal. Biliary tree is normal. Pancreas: Unremarkable. No pancreatic ductal dilatation or surrounding inflammatory changes. Spleen: Normal in size without focal abnormality. Adrenals/Urinary Tract: Adrenal glands are normal. Renal parenchyma is normal bilaterally. The patient has extensive bilateral peripelvic cysts. No hydronephrosis. Bladder is normal.  Stomach/Bowel: Acute diverticulitis of the proximal sigmoid portion of the colon with pericolonic soft tissue stranding and mucosal thickening. There are 2 tiny air bubbles adjacent to the inflamed diverticulum which could represent micro perforation. Numerous diverticula in the left side of the colon. Vascular/Lymphatic: Aortic atherosclerosis. No enlarged abdominal or pelvic lymph nodes. Reproductive: Status post hysterectomy. No adnexal masses. Other: No abdominal wall hernia or abnormality. No abdominopelvic ascites. Musculoskeletal: No acute abnormality. Degenerative facet arthritis in the lower lumbar spine. IMPRESSION: 1. Acute diverticulitis of the proximal sigmoid portion of the colon with 2 tiny air bubbles adjacent to the inflamed diverticulum which could represent micro perforation. 2. Extensive diverticulosis of the left side of the colon. Aortic Atherosclerosis (ICD10-I70.0). Electronically Signed   By: Lorriane Shire M.D.   On: 11/26/2018 14:48      IMPRESSION AND PLAN:   1.  Acute diverticulitis with microperforation - IV antibiotic therapy with Zosyn and Flagyl - Normal saline infusing to peripheral IV 100 cc/h -IV antiemetic as needed - Analgesic as needed for pain - General surgery consulted for further evaluation and recommendations  2. abdominal pain -We will treat with IV analgesic as needed  3.  GERD - PPI therapy initiated  4.  Hypothyroidism - Synthroid continued  DVT and PPI prophylaxis initiated    All the records are reviewed and case discussed with ED provider. The plan of care was discussed in details with the patient (and family). I answered all questions. The patient agreed to proceed with the above mentioned plan. Further management will depend upon hospital course.   CODE STATUS: Full code  TOTAL TIME TAKING CARE OF THIS PATIENT:53minutes.    [place] 11/26/2018 at 8:32 PM  Pager - [phone]  After 6pm go to [email or web address] -  Proofreader  [facility]  Office  [phone]  CC: Primary care physician; Owens Loffler, MD   Note: This dictation was prepared with Dragon dictation along with smaller phrase technology. Any transcriptional errors that result from this process are unintentional.

## 2018-11-26 NOTE — Consult Note (Signed)
SURGICAL CONSULTATION NOTE   HISTORY OF PRESENT ILLNESS (HPI):  68 y.o. female presented to [facility] ED for evaluation of abdominal pain. Patient reports having abdominal pain since 4 days ago.  Patient reports that initially was very mild but during the weekend he has been intensifying.  Patient started on the periumbilical area and now radiated to the left lower quadrant.  Pain now more localized to the left lower quadrant.  She reports associated palpitations.  Denies fevers.  Positive for chills.  Reports associated nausea but no vomiting.  Reports that the stool is darker but has not seen bright red blood.  Denies diarrhea.  Patient went to see her primary care physician and he ordered a CT scan of the abdomen and pelvis.  CT scan shows inflammation of the sigmoid colon with diverticulosis concerning of acute diverticulitis.  I personally evaluated the images.  There is no free air.  Surgery is consulted by Dr. Jacqualine Code in this context for evaluation and management of acute diverticulitis.  PAST MEDICAL HISTORY (PMH):  Past Medical History:  Diagnosis Date  . Anemia    years ago - had hysterectomy to stop - hgb 5.2  . Arthritis    hands  . Blood transfusion without reported diagnosis    prior to hysterectomy  . Cataract    beginnings   . Diverticulitis 2018  . GERD (gastroesophageal reflux disease)    occ -uses TUMS  . Lichen sclerosus   . 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 Unspecified essential hypertension 10/21/2008     PAST SURGICAL HISTORY [facility]):  Past Surgical History:  Procedure Laterality Date  . ABDOMINAL HYSTERECTOMY  [id number]   partial, fibroids  . BREAST LUMPECTOMY     right,benign  . MENISCUS REPAIR Left      MEDICATIONS:  Prior to Admission medications   Medication Sig Start Date End Date Taking? Authorizing Provider  amoxicillin-clavulanate (AUGMENTIN) 875-125 MG tablet Take 1 tablet by  mouth 2 (two) times daily for 10 days. 11/26/18 12/06/18  Copland, Frederico Hamman, MD  Ascorbic Acid (VITAMIN C PO) Take 1,000 mg by mouth daily.    [provider]  Cholecalciferol (VITAMIN D3 PO) Take 1,000 Units by mouth daily.    [provider]  clobetasol cream (TEMOVATE) [id number] % Apply 1 application topically 2 (two) times daily. 10/22/18   Copland, Frederico Hamman, MD  fluticasone (FLONASE) 50 MCG/ACT nasal spray Place 2 sprays into both nostrils daily. 10/22/18   Copland, Frederico Hamman, MD  magic mouthwash SOLN SWISH 5 ML BY MOUTH ,HOLD AND EXPECTORATE, REPEAT NO MORE THAN EVERY 4 HOURS 08/08/17   [provider]  meclizine (ANTIVERT) 25 MG tablet TAKE ONE TABLET BY MOUTH EVERY 8 HOURS AS NEEDED FOR DIZZINESS 09/04/17   Copland, Frederico Hamman, MD  meloxicam (MOBIC) 15 MG tablet Take 1 tablet (15 mg total) by mouth daily. 04/17/17   Hyatt, Max T, DPM  SYNTHROID 112 MCG tablet Take 1 tablet (112 mcg total) by mouth daily before breakfast. 06/05/18   Elayne Snare, MD  tolterodine (DETROL LA) 4 MG 24 hr capsule Take 1 capsule (4 mg total) by mouth daily. 10/22/18   Owens Loffler, MD     ALLERGIES:  Allergies  Allergen Reactions  . Demerol [Meperidine] Rash  . Diphenhydramine Hcl     REACTION: abd cramps and diarrhea  . Doxycycline Nausea Only  . Epinephrine Other (See Comments)    Increased heart rate   .  Meperidine Hcl     REACTION: rash  . Prochlorperazine Edisylate     REACTION: increased heart rate- compazine      SOCIAL HISTORY:  Social History   Socioeconomic History  . Marital status: Married    Spouse name: Not on file  . Number of children: Not on file  . Years of education: Not on file  . Highest education level: Not on file  Occupational History  . Not on file  Social Needs  . Financial resource strain: Not on file  . Food insecurity    Worry: Not on file    Inability: Not on file  . Transportation needs    Medical: Not on file    Non-medical: Not on file  Tobacco  Use  . Smoking status: Never Smoker  . Smokeless tobacco: Never Used  Substance and Sexual Activity  . Alcohol use: Yes    Comment: occ glass of wine  . Drug use: No  . Sexual activity: Not Currently  Lifestyle  . Physical activity    Days per week: Not on file    Minutes per session: Not on file  . Stress: Not on file  Relationships  . Social Herbalist on phone: Not on file    Gets together: Not on file    Attends religious service: Not on file    Active member of club or organization: Not on file    Attends meetings of clubs or organizations: Not on file    Relationship status: Not on file  . Intimate partner violence    Fear of current or ex partner: Not on file    Emotionally abused: Not on file    Physically abused: Not on file    Forced sexual activity: Not on file  Other Topics Concern  . Not on file  Social History Narrative  . Not on file    The patient currently resides (home / rehab facility / nursing home): Home The patient normally is (ambulatory / bedbound): Ambulatory   FAMILY HISTORY:  Family History  Problem Relation Age of Onset  . Goiter Mother   . Heart disease Father   . Hypothyroidism Sister   . Stomach cancer Sister   . Colon cancer Maternal Grandmother   . Colon polyps Neg Hx   . Esophageal cancer Neg Hx   . Rectal cancer Neg Hx      REVIEW OF SYSTEMS:  Constitutional: denies weight loss, fever, chills, or sweats  Eyes: denies any other vision changes, history of eye injury  ENT: denies sore throat, hearing problems  Respiratory: denies shortness of breath, wheezing  Cardiovascular: denies chest pain, positive palpitations  Gastrointestinal: Positive abdominal pain, constipation and nausea, negative for vomiting or diarrhea genitourinary: denies burning with urination or urinary frequency Musculoskeletal: denies any other joint pains or cramps  Skin: denies any other rashes or skin discolorations  Neurological: denies any  other headache, dizziness, weakness  Psychiatric: denies any other depression, anxiety   All other review of systems were negative   VITAL SIGNS:  Temp:  [97.9 F (36.6 C)-98 F (36.7 C)] 98 F (36.7 C) (08/31 1525) Pulse Rate:  [89-100] 100 (08/31 1525) Resp:  [18] 18 (08/31 1525) BP: (132-174)/(80-81) 174/81 (08/31 1525) SpO2:  [96 %-100 %] 100 % (08/31 1525) Weight:  [78.2 kg] 78.2 kg (08/31 1057)             INTAKE/OUTPUT:  This shift: Total I/O In: 3 [I.V.:3] Out: -  Last 2 shifts: @IOLAST2SHIFTS @   PHYSICAL EXAM:  Constitutional:  -- Normal body habitus  -- Awake, alert, and oriented x3  Eyes:  -- Pupils equally round and reactive to light  -- No scleral icterus  Ear, nose, and throat:  -- No jugular venous distension  Pulmonary:  -- No crackles  -- Equal breath sounds bilaterally -- Breathing non-labored at rest Cardiovascular:  -- S1, S2 present  -- No pericardial rubs Gastrointestinal:  -- Abdomen soft, tender to palpation in the left lower quadrant, non-distended, no guarding or rebound tenderness -- No abdominal masses appreciated, pulsatile or otherwise  Musculoskeletal and Integumentary:  -- Wounds or skin discoloration: None appreciated -- Extremities: B/L UE and LE FROM, hands and feet warm, no edema  Neurologic:  -- Motor function: intact and symmetric -- Sensation: intact and symmetric   Labs:  CBC Latest Ref Rng & Units 11/26/2018 11/26/2018 09/13/2018  WBC 4.0 - 10.5 K/uL 18.9(H) 17.1(H) 9.9  Hemoglobin 12.0 - 15.0 g/dL 14.0 13.7 13.7  Hematocrit 36.0 - 46.0 % 41.5 41.1 41.8  Platelets 150 - 400 K/uL 299 282.0 299.0   CMP Latest Ref Rng & Units 11/26/2018 11/26/2018 11/26/2018  Glucose 70 - 99 mg/dL 113(H) - 103(H)  BUN 8 - 23 mg/dL 12 - 13  Creatinine 0.44 - 1.00 mg/dL 0.87 0.70 0.81  Sodium 135 - 145 mmol/L 135 - 138  Potassium 3.5 - 5.1 mmol/L 3.6 - 4.0  Chloride 98 - 111 mmol/L 101 - 103  CO2 22 - 32 mmol/L 24 - 26  Calcium 8.9 - 10.3  mg/dL 9.2 - 9.5  Total Protein 6.5 - 8.1 g/dL 8.1 - 7.3  Total Bilirubin 0.3 - 1.2 mg/dL 0.8 - 0.7  Alkaline Phos 38 - 126 U/L 72 - 70  AST 15 - 41 U/L 18 - 14  ALT 0 - 44 U/L 16 - 14    Imaging studies:  EXAM: CT ABDOMEN AND PELVIS WITH CONTRAST  TECHNIQUE: Multidetector CT imaging of the abdomen and pelvis was performed using the standard protocol following bolus administration of intravenous contrast.  CONTRAST:  123mL OMNIPAQUE IOHEXOL 300 MG/ML  SOLN  COMPARISON:  10/27/2016  FINDINGS: Lower chest: No acute abnormality.  Hepatobiliary: Multiple benign-appearing cysts in the liver with the largest being 2.7 cm in the right lobe. These have minimally progressed since 2018. Liver parenchyma is otherwise normal. Biliary tree is normal.  Pancreas: Unremarkable. No pancreatic ductal dilatation or surrounding inflammatory changes.  Spleen: Normal in size without focal abnormality.  Adrenals/Urinary Tract: Adrenal glands are normal. Renal parenchyma is normal bilaterally. The patient has extensive bilateral peripelvic cysts. No hydronephrosis. Bladder is normal.  Stomach/Bowel: Acute diverticulitis of the proximal sigmoid portion of the colon with pericolonic soft tissue stranding and mucosal thickening. There are 2 tiny air bubbles adjacent to the inflamed diverticulum which could represent micro perforation. Numerous diverticula in the left side of the colon.  Vascular/Lymphatic: Aortic atherosclerosis. No enlarged abdominal or pelvic lymph nodes.  Reproductive: Status post hysterectomy. No adnexal masses.  Other: No abdominal wall hernia or abnormality. No abdominopelvic ascites.  Musculoskeletal: No acute abnormality. Degenerative facet arthritis in the lower lumbar spine.  IMPRESSION: 1. Acute diverticulitis of the proximal sigmoid portion of the colon with 2 tiny air bubbles adjacent to the inflamed diverticulum which could represent micro  perforation. 2. Extensive diverticulosis of the left side of the colon.  Aortic Atherosclerosis (ICD10-I70.0).   Electronically Signed   By: Lorriane Shire M.D.  On: 11/26/2018 14:48   Assessment/Plan:  68 y.o. female with acute uncomplicated diverticulitis, complicated by pertinent comorbidities including thyroid disease.  Patient with diverticulitis without abscess or perforation.  Due to the elevated white blood cell count and the amount of pain that she has in the left lower quadrant I agree with the admission for IV antibiotic therapy.  The patient was oriented about the CT scan finding of diverticulitis.  This is basically the second episode.  First episode 2 years ago and was treated as outpatient with oral antibiotics.  This is the first episode that she is admitted to the hospital for these diverticulitis.  I discussed with her that I will recommend any surgical management at this moment and that most of the time the patient respond to IV antibiotics.  He was also oriented if she does not respond to IV antibiotic and surgical management will need partial colectomy with end colostomy.  I would recommend to start with a clear liquid diet until pain improves.  I will follow her with physical exam, vital signs and labs trend.  Will follow while patient is admitted for further recommendations.  Arnold Long, MD

## 2018-11-27 ENCOUNTER — Ambulatory Visit: Payer: PPO

## 2018-11-27 ENCOUNTER — Other Ambulatory Visit: Payer: PPO

## 2018-11-27 LAB — BASIC METABOLIC PANEL
Anion gap: 8 (ref 5–15)
BUN: 12 mg/dL (ref 8–23)
CO2: 23 mmol/L (ref 22–32)
Calcium: 8.6 mg/dL — ABNORMAL LOW (ref 8.9–10.3)
Chloride: 110 mmol/L (ref 98–111)
Creatinine, Ser: 0.76 mg/dL (ref 0.44–1.00)
GFR calc Af Amer: >60 mL/min (ref >60)
GFR calc non Af Amer: >60 mL/min (ref >60)
Glucose, Bld: 96 mg/dL (ref 70–99)
Potassium: 3.5 mmol/L (ref 3.5–5.1)
Sodium: 141 mmol/L (ref 135–145)

## 2018-11-27 LAB — CBC
HCT: 35.7 % — ABNORMAL LOW (ref 36.0–46.0)
Hemoglobin: 11.9 g/dL — ABNORMAL LOW (ref 12.0–15.0)
MCH: 28.7 pg (ref 26.0–34.0)
MCHC: 33.3 g/dL (ref 30.0–36.0)
MCV: 86 fL (ref 80.0–100.0)
Platelets: 238 10*3/uL (ref 150–400)
RBC: 4.15 MIL/uL (ref 3.87–5.11)
RDW: 13.4 % (ref 11.5–15.5)
WBC: 12.5 10*3/uL — ABNORMAL HIGH (ref 4.0–10.5)
nRBC: 0 % (ref 0.0–0.2)

## 2018-11-27 LAB — SARS CORONAVIRUS 2 (TAT 6-24 HRS): SARS Coronavirus 2: NEGATIVE

## 2018-11-27 LAB — PROTIME-INR
INR: 1.1 (ref 0.8–1.2)
Prothrombin Time: 14.3 seconds (ref 11.4–15.2)

## 2018-11-27 MED ORDER — PIPERACILLIN-TAZOBACTAM 3.375 G IVPB
3.3750 g | Freq: Three times a day (TID) | INTRAVENOUS | Status: DC
  Administered 2018-11-27 – 2018-11-28 (×4): 3.375 g via INTRAVENOUS
  Filled 2018-11-27 (×4): qty 50

## 2018-11-27 MED ORDER — SODIUM CHLORIDE 0.9 % IV SOLN
INTRAVENOUS | Status: DC | PRN
  Administered 2018-11-27: 250 mL via INTRAVENOUS

## 2018-11-27 MED ORDER — PANTOPRAZOLE SODIUM 40 MG IV SOLR
40.0000 mg | INTRAVENOUS | Status: DC
  Administered 2018-11-27 – 2018-11-28 (×2): 40 mg via INTRAVENOUS
  Filled 2018-11-27 (×2): qty 40

## 2018-11-27 NOTE — ED Notes (Signed)
Pt ambulatory to toilet with a steady gait. 

## 2018-11-27 NOTE — Progress Notes (Signed)
[facility] [place] NAME: Melissa Cox    MR#:  [id number]  DATE OF BIRTH:  [birth year]  SUBJECTIVE:   patient reports that abdominal pain has improved.  Tolerating diet.  REVIEW OF SYSTEMS:    Review of Systems  Constitutional: Negative for fever, chills weight loss HENT: Negative for ear pain, nosebleeds, congestion, facial swelling, rhinorrhea, neck pain, neck stiffness and ear discharge.   Respiratory: Negative for cough, shortness of breath, wheezing  Cardiovascular: Negative for chest pain, palpitations and leg swelling.  Gastrointestinal: Negative for heartburn, abdominal pain, vomiting, diarrhea or consitpation Genitourinary: Negative for dysuria, urgency, frequency, hematuria Musculoskeletal: Negative for back pain or joint pain Neurological: Negative for dizziness, seizures, syncope, focal weakness,  numbness and headaches.  Hematological: Does not bruise/bleed easily.  Psychiatric/Behavioral: Negative for hallucinations, confusion, dysphoric mood    Tolerating Diet: yes      DRUG ALLERGIES:   Allergies  Allergen Reactions  . Demerol [Meperidine] Rash  . Meperidine Hcl     REACTION: rash  . Diphenhydramine Hcl     REACTION: abd cramps and diarrhea  . Doxycycline Nausea Only  . Epinephrine Other (See Comments)    Increased heart rate   . Prochlorperazine Edisylate     REACTION: increased heart rate- compazine     VITALS:  Blood pressure 106/69, pulse 66, temperature 97.8 F (36.6 C), temperature source Oral, resp. rate 18, height 5\' 4"  (1.626 m), weight 80.2 kg, SpO2 96 %.  PHYSICAL EXAMINATION:  Constitutional: Appears well-developed and well-nourished. No distress. HENT: Normocephalic. Marland Kitchen Oropharynx is clear and moist.  Eyes: Conjunctivae and EOM are normal. PERRLA, no scleral icterus.  Neck: Normal ROM. Neck supple. No JVD. No tracheal deviation. CVS: RRR, S1/S2 +, no murmurs, no gallops, no carotid bruit.   Pulmonary: Effort and breath sounds normal, no stridor, rhonchi, wheezes, rales.  Abdominal: Soft. BS +,  no distension, tenderness, rebound or guarding.  Musculoskeletal: Normal range of motion. No edema and no tenderness.  Neuro: Alert. CN 2-12 grossly intact. No focal deficits. Skin: Skin is warm and dry. No rash noted. Psychiatric: Normal mood and affect.      LABORATORY PANEL:   CBC Recent Labs  Lab 11/27/18 0452  WBC 12.5*  HGB 11.9*  HCT 35.7*  PLT 238   ------------------------------------------------------------------------------------------------------------------  Chemistries  Recent Labs  Lab 11/26/18 1528 11/27/18 0452  NA 135 141  K 3.6 3.5  CL 101 110  CO2 24 23  GLUCOSE 113* 96  BUN 12 12  CREATININE 0.87 0.76  CALCIUM 9.2 8.6*  AST 18  --   ALT 16  --   ALKPHOS 72  --   BILITOT 0.8  --    ------------------------------------------------------------------------------------------------------------------  Cardiac Enzymes No results for input(s): TROPONINI in the last 168 hours. ------------------------------------------------------------------------------------------------------------------  RADIOLOGY:  Ct Abdomen Pelvis W Contrast  Result Date: 11/26/2018 CLINICAL DATA:  Abdominal pain, bloating, and nausea. EXAM: CT ABDOMEN AND PELVIS WITH CONTRAST TECHNIQUE: Multidetector CT imaging of the abdomen and pelvis was performed using the standard protocol following bolus administration of intravenous contrast. CONTRAST:  123mL OMNIPAQUE IOHEXOL 300 MG/ML  SOLN COMPARISON:  10/27/2016 FINDINGS: Lower chest: No acute abnormality. Hepatobiliary: Multiple benign-appearing cysts in the liver with the largest being 2.7 cm in the right lobe. These have minimally progressed since 2018. Liver parenchyma is otherwise normal. Biliary tree is normal. Pancreas: Unremarkable. No pancreatic ductal dilatation or surrounding inflammatory changes. Spleen: Normal in size  without focal  abnormality. Adrenals/Urinary Tract: Adrenal glands are normal. Renal parenchyma is normal bilaterally. The patient has extensive bilateral peripelvic cysts. No hydronephrosis. Bladder is normal. Stomach/Bowel: Acute diverticulitis of the proximal sigmoid portion of the colon with pericolonic soft tissue stranding and mucosal thickening. There are 2 tiny air bubbles adjacent to the inflamed diverticulum which could represent micro perforation. Numerous diverticula in the left side of the colon. Vascular/Lymphatic: Aortic atherosclerosis. No enlarged abdominal or pelvic lymph nodes. Reproductive: Status post hysterectomy. No adnexal masses. Other: No abdominal wall hernia or abnormality. No abdominopelvic ascites. Musculoskeletal: No acute abnormality. Degenerative facet arthritis in the lower lumbar spine. IMPRESSION: 1. Acute diverticulitis of the proximal sigmoid portion of the colon with 2 tiny air bubbles adjacent to the inflamed diverticulum which could represent micro perforation. 2. Extensive diverticulosis of the left side of the colon. Aortic Atherosclerosis (ICD10-I70.0). Electronically Signed   By: Lorriane Shire M.D.   On: 11/26/2018 14:48     ASSESSMENT AND PLAN:   68 year old female with hypertension not currently on medications who presented to the emergency room due to lower abdominal pain.    1.Acute diverticulitis of proximal sigmoid portion of the colon with possible microperforation: This is etiology of patient's abdominal pain.  Patient evaluated by surgery.  At this time we will continue conservative management with IV fluids and antibiotics.  This is patient's second episode of diverticulitis.    2. HTN essential: PRN hydralazine  3.  Leukocytosis: This is improved.   Management plans discussed with the patient and she is in agreement.  CODE STATUS: full  TOTAL TIME TAKING CARE OF THIS PATIENT: 30 minutes.     POSSIBLE D/C 2-3 days, DEPENDING ON CLINICAL  CONDITION.   Bettey Costa M.D on 11/27/2018 at 12:04 PM  Between 7am to 6pm - Pager - [phone] After 6pm go to [email or web address] - password [email or web address] [facility]  Office  [phone]  CC: Primary care physician; Owens Loffler, MD  Note: This dictation was prepared with Dragon dictation along with smaller phrase technology. Any transcriptional errors that result from this process are unintentional.

## 2018-11-27 NOTE — Progress Notes (Signed)
[place] [facility] Day(s): 1.   Post op day(s):  Marland Kitchen   Interval History: Patient seen and examined, no acute events or new complaints overnight. Patient reports feeling better today.  She still has moderate pain on the left lower quadrant.  She denies nausea or vomiting.  She denies worsening pain with liquid diet.  She denies fever or chills.  She reports passing gas.  Vital signs in last 24 hours: [min-max] current  Temp:  [97.7 F (36.5 C)-98.5 F (36.9 C)] 97.8 F (36.6 C) (09/01 0609) Pulse Rate:  [66-100] 66 (09/01 0609) Resp:  [17-18] 18 (09/01 0609) BP: (106-174)/(61-81) 106/69 (09/01 0609) SpO2:  [96 %-100 %] 96 % (09/01 0609) Weight:  [80.2 kg] 80.2 kg (09/01 0228)     Height: 5\' 4"  (162.6 cm) Weight: 80.2 kg BMI (Calculated): 30.35   Physical Exam:  Constitutional: alert, cooperative and no distress  Respiratory: breathing non-labored at rest  Cardiovascular: regular rate and sinus rhythm  Gastrointestinal: soft, mild-tender, and non-distended  Labs:  CBC Latest Ref Rng & Units 11/27/2018 11/26/2018 11/26/2018  WBC 4.0 - 10.5 K/uL 12.5(H) 18.9(H) 17.1(H)  Hemoglobin 12.0 - 15.0 g/dL 11.9(L) 14.0 13.7  Hematocrit 36.0 - 46.0 % 35.7(L) 41.5 41.1  Platelets 150 - 400 K/uL 238 299 282.0   CMP Latest Ref Rng & Units 11/27/2018 11/26/2018 11/26/2018  Glucose 70 - 99 mg/dL 96 113(H) -  BUN 8 - 23 mg/dL 12 12 -  Creatinine 0.44 - 1.00 mg/dL 0.76 0.87 0.70  Sodium 135 - 145 mmol/L 141 135 -  Potassium 3.5 - 5.1 mmol/L 3.5 3.6 -  Chloride 98 - 111 mmol/L 110 101 -  CO2 22 - 32 mmol/L 23 24 -  Calcium 8.9 - 10.3 mg/dL 8.6(L) 9.2 -  Total Protein 6.5 - 8.1 g/dL - 8.1 -  Total Bilirubin 0.3 - 1.2 mg/dL - 0.8 -  Alkaline Phos 38 - 126 U/L - 72 -  AST 15 - 41 U/L - 18 -  ALT 0 - 44 U/L - 16 -    Imaging studies: No new pertinent imaging studies   Assessment/Plan:  68 y.o. female with acute uncomplicated diverticulitis, complicated by pertinent comorbidities  including thyroid disease.  Patient with clinical improvement today.  Pain is apparently.  Patient is able to move more without the abdominal pain.  She still has some moderate tenderness on the palpation.  She is tolerating clear liquids.  I agree with advancing to full liquids.  Leukocytosis improved.  There is no fever or chills.  We will continue follow-up with vital signs, physical exam and assessing diet toleration.  If patient continue to improve, may change antibiotics to oral.  No surgical management needed at this moment.  Arnold Long, MD

## 2018-11-27 NOTE — Progress Notes (Signed)
Family Meeting Note  Advance Directive:yes  Today a meeting took place with the Patient.   The following clinical team members were present during this meeting:MD  The following were discussed:Patient's diagnosis: diverticulitis with perforation, Patient's progosis: Unable to determine and Goals for treatment: Full Code  Additional follow-up to be provided: Surgery  Time spent during discussion:20 minutes  Vaughan Basta, MD

## 2018-11-27 NOTE — Progress Notes (Signed)
Received from ER to room 221 via stretcher. Assisted to bed and positioned for comfort. Oriented to room, bed and unit. No acute distress noted and denies pain at present.

## 2018-11-28 LAB — COMPREHENSIVE METABOLIC PANEL
ALT: 22 U/L (ref 0–44)
AST: 22 U/L (ref 15–41)
Albumin: 3.1 g/dL — ABNORMAL LOW (ref 3.5–5.0)
Alkaline Phosphatase: 61 U/L (ref 38–126)
Anion gap: 7 (ref 5–15)
BUN: 11 mg/dL (ref 8–23)
CO2: 25 mmol/L (ref 22–32)
Calcium: 8.6 mg/dL — ABNORMAL LOW (ref 8.9–10.3)
Chloride: 111 mmol/L (ref 98–111)
Creatinine, Ser: 0.72 mg/dL (ref 0.44–1.00)
GFR calc Af Amer: >60 mL/min (ref >60)
GFR calc non Af Amer: >60 mL/min (ref >60)
Glucose, Bld: 92 mg/dL (ref 70–99)
Potassium: 3.7 mmol/L (ref 3.5–5.1)
Sodium: 143 mmol/L (ref 135–145)
Total Bilirubin: 0.3 mg/dL (ref 0.3–1.2)
Total Protein: 6 g/dL — ABNORMAL LOW (ref 6.5–8.1)

## 2018-11-28 LAB — HIV ANTIBODY (ROUTINE TESTING W REFLEX): HIV Screen 4th Generation wRfx: NONREACTIVE

## 2018-11-28 NOTE — Discharge Summary (Signed)
[facility] [place] NAME: Melissa Cox    MR#:  [id number]  DATE OF BIRTH:  1950-08-01  DATE OF ADMISSION:  11/26/2018 ADMITTING PHYSICIAN: Vaughan Basta, MD  DATE OF DISCHARGE: 11/28/2018  PRIMARY CARE PHYSICIAN: Owens Loffler, MD    ADMISSION DIAGNOSIS:  Acute diverticulitis of intestine [K57.92]  DISCHARGE DIAGNOSIS:  Active Problems:   Diverticulitis   SECONDARY DIAGNOSIS:   Past Medical History:  Diagnosis Date  . Anemia    years ago - had hysterectomy to stop - hgb 5.2  . Arthritis    hands  . Blood transfusion without reported diagnosis    prior to hysterectomy  . Cataract    beginnings   . Diverticulitis 2018  . GERD (gastroesophageal reflux disease)    occ -uses TUMS  . Lichen sclerosus   . 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 Unspecified essential hypertension 10/21/2008    HOSPITAL COURSE:  68 year old female with hypertension not currently on medications who presented to the emergency room due to lower abdominal pain.    1.Acute diverticulitis of proximal sigmoid portion of the colon with microperforation: This is etiology of patient's abdominal pain.  Patient symptoms have subsided.  She has no fever or abdominal pain.  She is tolerating her diet well.  She was evaluated during this hospitalization by the surgical services.  There is no indication for surgery at this time.  She will be discharged on oral Augmentin which she received prior to this admission.  She will complete the course of therapy.  She will follow-up with surgery in 3 to 4 weeks.    2. HTN essential: She is currently not on medications as an outpatient.3.  3 Leukocytosis: This has  improved and was due to problem #1 4.  Hypothyroidism: Continue Synthroid  DISCHARGE CONDITIONS AND DIET:   Stable for discharge regular diet  CONSULTS OBTAINED:  Treatment  Team:  Herbert Pun, MD  DRUG ALLERGIES:   Allergies  Allergen Reactions  . Demerol [Meperidine] Rash  . Meperidine Hcl     REACTION: rash  . Diphenhydramine Hcl     REACTION: abd cramps and diarrhea  . Doxycycline Nausea Only  . Epinephrine Other (See Comments)    Increased heart rate   . Prochlorperazine Edisylate     REACTION: increased heart rate- compazine     DISCHARGE MEDICATIONS:   Allergies as of 11/28/2018      Reactions   Demerol [meperidine] Rash   Meperidine Hcl    REACTION: rash   Diphenhydramine Hcl    REACTION: abd cramps and diarrhea   Doxycycline Nausea Only   Epinephrine Other (See Comments)   Increased heart rate    Prochlorperazine Edisylate    REACTION: increased heart rate- compazine       Medication List    STOP taking these medications   meloxicam 15 MG tablet Commonly known as: MOBIC     TAKE these medications   amoxicillin-clavulanate 875-125 MG tablet Commonly known as: Augmentin Take 1 tablet by mouth 2 (two) times daily for 10 days.   clobetasol cream 0.05 % Commonly known as: TEMOVATE Apply 1 application topically 2 (two) times daily.   fluticasone 50 MCG/ACT nasal spray Commonly known as: FLONASE Place 2 sprays into both nostrils daily.   magic mouthwash Soln SWISH 5 ML BY MOUTH ,HOLD AND EXPECTORATE, REPEAT NO MORE THAN EVERY 4 HOURS  meclizine 25 MG tablet Commonly known as: ANTIVERT TAKE ONE TABLET BY MOUTH EVERY 8 HOURS AS NEEDED FOR DIZZINESS   Synthroid 112 MCG tablet Generic drug: levothyroxine Take 1 tablet (112 mcg total) by mouth daily before breakfast.   tolterodine 4 MG 24 hr capsule Commonly known as: DETROL LA Take 1 capsule (4 mg total) by mouth daily.   VITAMIN C PO Take 1,000 mg by mouth daily.   VITAMIN D3 PO Take 1,000 Units by mouth daily.         Today   CHIEF COMPLAINT:  Denies abdominal pain.  Tolerating diet well.   VITAL SIGNS:  Blood pressure (!) 144/80, pulse 63,  temperature 98 F (36.7 C), temperature source Oral, resp. rate 18, height 5\' 4"  (1.626 m), weight 80.2 kg, SpO2 94 %.   REVIEW OF SYSTEMS:  Review of Systems  Constitutional: Negative.  Negative for chills, fever and malaise/fatigue.  HENT: Negative.  Negative for ear discharge, ear pain, hearing loss, nosebleeds and sore throat.   Eyes: Negative.  Negative for blurred vision and pain.  Respiratory: Negative.  Negative for cough, hemoptysis, shortness of breath and wheezing.   Cardiovascular: Negative.  Negative for chest pain, palpitations and leg swelling.  Gastrointestinal: Negative.  Negative for abdominal pain, blood in stool, diarrhea, nausea and vomiting.  Genitourinary: Negative.  Negative for dysuria.  Musculoskeletal: Negative.  Negative for back pain.  Skin: Negative.   Neurological: Negative for dizziness, tremors, speech change, focal weakness, seizures and headaches.  Endo/Heme/Allergies: Negative.  Does not bruise/bleed easily.  Psychiatric/Behavioral: Negative.  Negative for depression, hallucinations and suicidal ideas.     PHYSICAL EXAMINATION:  GENERAL:  68 y.o.-year-old patient lying in the bed with no acute distress.  NECK:  Supple, no jugular venous distention. No thyroid enlargement, no tenderness.  LUNGS: Normal breath sounds bilaterally, no wheezing, rales,rhonchi  No use of accessory muscles of respiration.  CARDIOVASCULAR: S1, S2 normal. No murmurs, rubs, or gallops.  ABDOMEN: Soft, non-tender, non-distended. Bowel sounds present. No organomegaly or mass.  EXTREMITIES: No pedal edema, cyanosis, or clubbing.  PSYCHIATRIC: The patient is alert and oriented x 3.  SKIN: No obvious rash, lesion, or ulcer.   DATA REVIEW:   CBC Recent Labs  Lab 11/27/18 0452  WBC 12.5*  HGB 11.9*  HCT 35.7*  PLT 238    Chemistries  Recent Labs  Lab 11/28/18 0414  NA 143  K 3.7  CL 111  CO2 25  GLUCOSE 92  BUN 11  CREATININE 0.72  CALCIUM 8.6*  AST 22  ALT  22  ALKPHOS 61  BILITOT 0.3    Cardiac Enzymes No results for input(s): TROPONINI in the last 168 hours.  Microbiology Results  @[email or web address] @  RADIOLOGY:  Ct Abdomen Pelvis W Contrast  Result Date: 11/26/2018 CLINICAL DATA:  Abdominal pain, bloating, and nausea. EXAM: CT ABDOMEN AND PELVIS WITH CONTRAST TECHNIQUE: Multidetector CT imaging of the abdomen and pelvis was performed using the standard protocol following bolus administration of intravenous contrast. CONTRAST:  161mL OMNIPAQUE IOHEXOL 300 MG/ML  SOLN COMPARISON:  10/27/2016 FINDINGS: Lower chest: No acute abnormality. Hepatobiliary: Multiple benign-appearing cysts in the liver with the largest being 2.7 cm in the right lobe. These have minimally progressed since 2018. Liver parenchyma is otherwise normal. Biliary tree is normal. Pancreas: Unremarkable. No pancreatic ductal dilatation or surrounding inflammatory changes. Spleen: Normal in size without focal abnormality. Adrenals/Urinary Tract: Adrenal glands are normal. Renal parenchyma is normal bilaterally. The patient has extensive bilateral peripelvic  cysts. No hydronephrosis. Bladder is normal. Stomach/Bowel: Acute diverticulitis of the proximal sigmoid portion of the colon with pericolonic soft tissue stranding and mucosal thickening. There are 2 tiny air bubbles adjacent to the inflamed diverticulum which could represent micro perforation. Numerous diverticula in the left side of the colon. Vascular/Lymphatic: Aortic atherosclerosis. No enlarged abdominal or pelvic lymph nodes. Reproductive: Status post hysterectomy. No adnexal masses. Other: No abdominal wall hernia or abnormality. No abdominopelvic ascites. Musculoskeletal: No acute abnormality. Degenerative facet arthritis in the lower lumbar spine. IMPRESSION: 1. Acute diverticulitis of the proximal sigmoid portion of the colon with 2 tiny air bubbles adjacent to the inflamed diverticulum which could represent micro perforation. 2.  Extensive diverticulosis of the left side of the colon. Aortic Atherosclerosis (ICD10-I70.0). Electronically Signed   By: Lorriane Shire M.D.   On: 11/26/2018 14:48      Allergies as of 11/28/2018      Reactions   Demerol [meperidine] Rash   Meperidine Hcl    REACTION: rash   Diphenhydramine Hcl    REACTION: abd cramps and diarrhea   Doxycycline Nausea Only   Epinephrine Other (See Comments)   Increased heart rate    Prochlorperazine Edisylate    REACTION: increased heart rate- compazine       Medication List    STOP taking these medications   meloxicam 15 MG tablet Commonly known as: MOBIC     TAKE these medications   amoxicillin-clavulanate 875-125 MG tablet Commonly known as: Augmentin Take 1 tablet by mouth 2 (two) times daily for 10 days.   clobetasol cream 0.05 % Commonly known as: TEMOVATE Apply 1 application topically 2 (two) times daily.   fluticasone 50 MCG/ACT nasal spray Commonly known as: FLONASE Place 2 sprays into both nostrils daily.   magic mouthwash Soln SWISH 5 ML BY MOUTH ,HOLD AND EXPECTORATE, REPEAT NO MORE THAN EVERY 4 HOURS   meclizine 25 MG tablet Commonly known as: ANTIVERT TAKE ONE TABLET BY MOUTH EVERY 8 HOURS AS NEEDED FOR DIZZINESS   Synthroid 112 MCG tablet Generic drug: levothyroxine Take 1 tablet (112 mcg total) by mouth daily before breakfast.   tolterodine 4 MG 24 hr capsule Commonly known as: DETROL LA Take 1 capsule (4 mg total) by mouth daily.   VITAMIN C PO Take 1,000 mg by mouth daily.   VITAMIN D3 PO Take 1,000 Units by mouth daily.         Management plans discussed with the patient and she is in agreement. Stable for discharge home  Patient should follow up with PCP  CODE STATUS:     Code Status Orders  (From admission, onward)         Start     Ordered   11/26/18 2116  Full code  Continuous     11/26/18 2115        Code Status History    This patient has a current code status but no  historical code status.   Advance Care Planning Activity    Advance Directive Documentation     Most Recent Value  Type of Advance Directive  Healthcare Power of Attorney, Living will  Pre-existing out of facility DNR order (yellow form or pink MOST form)  -  "MOST" Form in Place?  -      TOTAL TIME TAKING CARE OF THIS PATIENT: 38 minutes.    Note: This dictation was prepared with Dragon dictation along with smaller phrase technology. Any transcriptional errors that result from this process  are unintentional.  Bettey Costa M.D on 11/28/2018 at 11:08 AM  Between 7am to 6pm - Pager - [phone] After 6pm go to [email or web address] - password [email or web address]  Office  [phone]  CC: Primary care physician; Owens Loffler, MD

## 2018-11-28 NOTE — Progress Notes (Signed)
Patient discharged earlier. Instructions reviewed with the patient and her husband. Patient sent out via wheelchair with belongings

## 2018-11-28 NOTE — Progress Notes (Signed)
[facility] Hospital Day(s): 2.   Post op day(s):  Marland Kitchen   Interval History: Patient seen and examined, no acute events or new complaints overnight. Patient reports feeling better today. Report that the pain is decreasing. No pain radiation. No alleviating or aggravating factor. Denies nausea or vomiting. Had diarrhea. Passing gas. No chills or fever.   Vital signs in last 24 hours: [min-max] current  Temp:  [98 F (36.7 C)-98.8 F (37.1 C)] 98 F (36.7 C) (09/02 0539) Pulse Rate:  [63-77] 63 (09/02 0539) Resp:  [16-18] 18 (09/02 0539) BP: (121-144)/(71-80) 144/80 (09/02 0539) SpO2:  [94 %-97 %] 94 % (09/02 0539)     Height: 5\' 4"  (162.6 cm) Weight: 80.2 kg BMI (Calculated): 30.35   Physical Exam:  Constitutional: alert, cooperative and no distress  Respiratory: breathing non-labored at rest  Cardiovascular: regular rate and sinus rhythm  Gastrointestinal: soft, mild-tender, and non-distended  Labs:  CBC Latest Ref Rng & Units 11/27/2018 11/26/2018 11/26/2018  WBC 4.0 - 10.5 K/uL 12.5(H) 18.9(H) 17.1(H)  Hemoglobin 12.0 - 15.0 g/dL 11.9(L) 14.0 13.7  Hematocrit 36.0 - 46.0 % 35.7(L) 41.5 41.1  Platelets 150 - 400 K/uL 238 299 282.0   CMP Latest Ref Rng & Units 11/28/2018 11/27/2018 11/26/2018  Glucose 70 - 99 mg/dL 92 96 113(H)  BUN 8 - 23 mg/dL 11 12 12   Creatinine 0.44 - 1.00 mg/dL 0.72 0.76 0.87  Sodium 135 - 145 mmol/L 143 141 135  Potassium 3.5 - 5.1 mmol/L 3.7 3.5 3.6  Chloride 98 - 111 mmol/L 111 110 101  CO2 22 - 32 mmol/L 25 23 24   Calcium 8.9 - 10.3 mg/dL 8.6(L) 8.6(L) 9.2  Total Protein 6.5 - 8.1 g/dL 6.0(L) - 8.1  Total Bilirubin 0.3 - 1.2 mg/dL 0.3 - 0.8  Alkaline Phos 38 - 126 U/L 61 - 72  AST 15 - 41 U/L 22 - 18  ALT 0 - 44 U/L 22 - 16    Imaging studies: No new pertinent imaging studies   Assessment/Plan:  68 y.o. female with acute diverticulitis.  Patient with uncomplicated sigmoid diverticulitis responding well to antibiotic therapy.  Tolerated full liquid diet. Passing gas. Diarrhea normal during active inflammation. I agree with current management. There is no need for surgical management at this moment. Since this was an uncomplicated diverticulitis, patient can be followed with PCP since no surgical management will be recommended at this moment. Patient oriented to increase fiber supplement 6 weeks after this episode to help with constipation and decrease the pressure in the sigmoid colon.   Arnold Long, MD

## 2018-11-29 ENCOUNTER — Telehealth: Payer: Self-pay | Admitting: Family Medicine

## 2018-11-29 NOTE — Telephone Encounter (Signed)
Transition Care Management Follow-up Telephone Call   Date discharged?  11/28/2018   How have you been since you were released from the hospital? "I am gradually getting better. Not feeling as much discomfort but it is still tender. I am not having to take any pain medications at this time. I am trying to be very diligent of what I eat and follow my instructions related to diet."   Do you understand why you were in the hospital? yes   Do you understand the discharge instructions? no   Where were you discharged to? Home with Husband and Daughter is staying to help   Items Reviewed:  Medications reviewed: yes  Allergies reviewed: yes  Dietary changes reviewed: yes  Referrals reviewed: yes   Functional Questionnaire:   Activities of Daily Living (ADLs):   She states they are independent in the following: ambulation, bathing and hygiene, feeding, continence, grooming, toileting, dressing and Independent, but daughter is there to help out if she needs it so that she does not overdo it States they require assistance with the following: n/a   Any transportation issues/concerns?: no, husband or daughter is available to bring her if not able to drive herself   Any patient concerns? no   Confirmed importance and date/time of follow-up visits scheduled yes  Provider Appointment booked with Dr Lorelei Pont on 12/05/2018 at 11:40am - In office appt per patient request.  Confirmed with patient if condition begins to worsen call PCP or go to the ER.  Patient was given the office number and encouraged to call back with question or concerns.  : yes

## 2018-12-04 NOTE — Progress Notes (Signed)
Melissa Cox T. Chaela Branscum, MD Primary Care and [place] at [facility] [place], [address] Phone: [phone]  FAX: [place] - 68 y.o. female  MRN [id number]  Date of Birth: Sep 06, 1950  Visit Date: 12/05/2018  PCP: Owens Loffler, MD  Referred by: Owens Loffler, MD  Chief Complaint  Patient presents with  . Hospitalization Follow-up   Subjective:   Melissa Cox is a 68 y.o. very pleasant female patient who presents with the following:  TCM.  DATE OF ADMISSION:  11/26/2018    DATE OF DISCHARGE: 11/28/2018  I saw the patient in the office on November 26, 2018, at that point patient was having some severe abdominal pain.  I would like to get a CT of the abdomen and pelvis with contrast, she had acute diverticulitis.  Subsequently, she was sent to the hospital for evaluation.  She was discharged on oral Augmentin and will follow up with general surgery who was involved in the case in the hospital.  Still with mild abd pain but advancing diet ? Yeast infect  The 10-year ASCVD risk score Mikey Bussing DC Jr., et al., 2013) is: 8.4%   Values used to calculate the score:     Age: 29 years     Sex: Female     Is Non-Hispanic African American: No     Diabetic: No     Tobacco smoker: No     Systolic Blood Pressure: [id number] mmHg     Is BP treated: No     HDL Cholesterol: 51.8 mg/dL     Total Cholesterol: 224 mg/dL   Wants to lose weight for her chol  Past Medical History, Surgical History, Social History, Family History, Problem List, Medications, and Allergies have been reviewed and updated if relevant.  Patient Active Problem List   Diagnosis Date Noted  . Diverticulitis 11/26/2018  . Lymphadenitis 12/08/2016  . 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 Unspecified vitamin D deficiency 05/17/2013    Past Medical History:  Diagnosis Date  . Anemia    years ago - had hysterectomy to  stop - hgb 5.2  . Arthritis    hands  . Blood transfusion without reported diagnosis    prior to hysterectomy  . Cataract    beginnings   . Diverticulitis 2018  . GERD (gastroesophageal reflux disease)    occ -uses TUMS  . Lichen sclerosus   . 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 Unspecified essential hypertension 10/21/2008    Past Surgical History:  Procedure Laterality Date  . ABDOMINAL HYSTERECTOMY  [id number]   partial, fibroids  . BREAST LUMPECTOMY     right,benign  . MENISCUS REPAIR Left     Social History   Socioeconomic History  . Marital status: Married    Spouse name: Not on file  . Number of children: Not on file  . Years of education: Not on file  . Highest education level: Not on file  Occupational History  . Not on file  Social Needs  . Financial resource strain: Not on file  . Food insecurity    Worry: Not on file    Inability: Not on file  . Transportation needs    Medical: Not on file    Non-medical: Not on file  Tobacco Use  . Smoking status: Never Smoker  . Smokeless tobacco: Never Used  Substance and  Sexual Activity  . Alcohol use: Yes    Comment: occ glass of wine  . Drug use: No  . Sexual activity: Not Currently  Lifestyle  . Physical activity    Days per week: Not on file    Minutes per session: Not on file  . Stress: Not on file  Relationships  . Social Herbalist on phone: Not on file    Gets together: Not on file    Attends religious service: Not on file    Active member of club or organization: Not on file    Attends meetings of clubs or organizations: Not on file    Relationship status: Not on file  . Intimate partner violence    Fear of current or ex partner: Not on file    Emotionally abused: Not on file    Physically abused: Not on file    Forced sexual activity: Not on file  Other Topics Concern  . Not on file  Social History Narrative   . Not on file    Family History  Problem Relation Age of Onset  . Goiter Mother   . Heart disease Father   . Hypothyroidism Sister   . Stomach cancer Sister   . Colon cancer Maternal Grandmother   . Colon polyps Neg Hx   . Esophageal cancer Neg Hx   . Rectal cancer Neg Hx     Allergies  Allergen Reactions  . Demerol [Meperidine] Rash  . Meperidine Hcl     REACTION: rash  . Diphenhydramine Hcl     REACTION: abd cramps and diarrhea  . Doxycycline Nausea Only  . Epinephrine Other (See Comments)    Increased heart rate   . Prochlorperazine Edisylate     REACTION: increased heart rate- compazine     Medication list reviewed and updated in full in [place].   GEN: No acute illnesses, no fevers, chills. GI: No n/v/d, eating normally Pulm: No SOB Interactive and getting along well at home.  Otherwise, ROS is as per the HPI.  Objective:   BP 130/70   Pulse 65   Temp 98.3 F (36.8 C) (Temporal)   Ht 5\' 4"  (1.626 m)   Wt 172 lb 8 oz (78.2 kg)   SpO2 97%   BMI 29.61 kg/m   GEN: WDWN, NAD, Non-toxic, A & O x 3 HEENT: Atraumatic, Normocephalic. Neck supple. No masses, No LAD. Ears and Nose: No external deformity. CV: RRR, No M/G/R. No JVD. No thrill. No extra heart sounds. PULM: CTA B, no wheezes, crackles, rhonchi. No retractions. No resp. distress. No accessory muscle use. ABD: S, NT, ND, + BS, No rebound, No HSM  EXTR: No c/c/e NEURO Normal gait.  PSYCH: Normally interactive. Conversant. Not depressed or anxious appearing.  Calm demeanor.   Laboratory and Imaging Data: Results for orders placed or performed during the hospital encounter of 11/26/18  SARS CORONAVIRUS 2 (TAT 6-24 HRS) Nasopharyngeal Nasopharyngeal Swab   Specimen: Nasopharyngeal Swab  Result Value Ref Range   SARS Coronavirus 2 NEGATIVE NEGATIVE  Lipase, blood  Result Value Ref Range   Lipase 22 11 - 51 U/L  Comprehensive metabolic panel  Result Value Ref Range   Sodium 135 135 -  145 mmol/L   Potassium 3.6 3.5 - 5.1 mmol/L   Chloride 101 98 - 111 mmol/L   CO2 24 22 - 32 mmol/L   Glucose, Bld 113 (H) 70 - 99 mg/dL   BUN 12 8 -  23 mg/dL   Creatinine, Ser 0.87 0.44 - 1.00 mg/dL   Calcium 9.2 8.9 - 10.3 mg/dL   Total Protein 8.1 6.5 - 8.1 g/dL   Albumin 4.1 3.5 - 5.0 g/dL   AST 18 15 - 41 U/L   ALT 16 0 - 44 U/L   Alkaline Phosphatase 72 38 - 126 U/L   Total Bilirubin 0.8 0.3 - 1.2 mg/dL   GFR calc non Af Amer >60 >60 mL/min   GFR calc Af Amer >60 >60 mL/min   Anion gap 10 5 - 15  CBC  Result Value Ref Range   WBC 18.9 (H) 4.0 - 10.5 K/uL   RBC 4.92 3.87 - 5.11 MIL/uL   Hemoglobin 14.0 12.0 - 15.0 g/dL   HCT 41.5 36.0 - 46.0 %   MCV 84.3 80.0 - 100.0 fL   MCH 28.5 26.0 - 34.0 pg   MCHC 33.7 30.0 - 36.0 g/dL   RDW 13.6 11.5 - 15.5 %   Platelets 299 150 - 400 K/uL   nRBC 0.0 0.0 - 0.2 %  Urinalysis, Complete w Microscopic  Result Value Ref Range   Color, Urine YELLOW (A) YELLOW   APPearance CLEAR (A) CLEAR   Specific Gravity, Urine >1.046 (H) 1.005 - 1.030   pH 5.0 5.0 - 8.0   Glucose, UA NEGATIVE NEGATIVE mg/dL   Hgb urine dipstick SMALL (A) NEGATIVE   Bilirubin Urine NEGATIVE NEGATIVE   Ketones, ur 20 (A) NEGATIVE mg/dL   Protein, ur NEGATIVE NEGATIVE mg/dL   Nitrite NEGATIVE NEGATIVE   Leukocytes,Ua NEGATIVE NEGATIVE   RBC / HPF 0-5 0 - 5 RBC/hpf   WBC, UA 0-5 0 - 5 WBC/hpf   Bacteria, UA NONE SEEN NONE SEEN   Squamous Epithelial / LPF 0-5 0 - 5  HIV antibody (Routine Testing)  Result Value Ref Range   HIV Screen 4th Generation wRfx Non Reactive Non Reactive  Basic metabolic panel  Result Value Ref Range   Sodium 141 135 - 145 mmol/L   Potassium 3.5 3.5 - 5.1 mmol/L   Chloride 110 98 - 111 mmol/L   CO2 23 22 - 32 mmol/L   Glucose, Bld 96 70 - 99 mg/dL   BUN 12 8 - 23 mg/dL   Creatinine, Ser 0.76 0.44 - 1.00 mg/dL   Calcium 8.6 (L) 8.9 - 10.3 mg/dL   GFR calc non Af Amer >60 >60 mL/min   GFR calc Af Amer >60 >60 mL/min   Anion gap 8 5  - 15  CBC  Result Value Ref Range   WBC 12.5 (H) 4.0 - 10.5 K/uL   RBC 4.15 3.87 - 5.11 MIL/uL   Hemoglobin 11.9 (L) 12.0 - 15.0 g/dL   HCT 35.7 (L) 36.0 - 46.0 %   MCV 86.0 80.0 - 100.0 fL   MCH 28.7 26.0 - 34.0 pg   MCHC 33.3 30.0 - 36.0 g/dL   RDW 13.4 11.5 - 15.5 %   Platelets 238 150 - 400 K/uL   nRBC 0.0 0.0 - 0.2 %  Protime-INR  Result Value Ref Range   Prothrombin Time 14.3 11.4 - 15.2 seconds   INR 1.1 0.8 - 1.2  Comprehensive metabolic panel  Result Value Ref Range   Sodium 143 135 - 145 mmol/L   Potassium 3.7 3.5 - 5.1 mmol/L   Chloride 111 98 - 111 mmol/L   CO2 25 22 - 32 mmol/L   Glucose, Bld 92 70 - 99 mg/dL   BUN 11  8 - 23 mg/dL   Creatinine, Ser 0.72 0.44 - 1.00 mg/dL   Calcium 8.6 (L) 8.9 - 10.3 mg/dL   Total Protein 6.0 (L) 6.5 - 8.1 g/dL   Albumin 3.1 (L) 3.5 - 5.0 g/dL   AST 22 15 - 41 U/L   ALT 22 0 - 44 U/L   Alkaline Phosphatase 61 38 - 126 U/L   Total Bilirubin 0.3 0.3 - 1.2 mg/dL   GFR calc non Af Amer >60 >60 mL/min   GFR calc Af Amer >60 >60 mL/min   Anion gap 7 5 - 15     Assessment and Plan:     ICD-10-CM   1. Diverticulitis  K57.92   2. Yeast infection involving the vagina and surrounding area  B37.3    Improving, advance diet as able.  Extend augmentin and give diflucan  Follow-up: Return for Health Maintenance next year.  Meds ordered this encounter  Medications  . amoxicillin-clavulanate (AUGMENTIN) 875-125 MG tablet    Sig: Take 1 tablet by mouth 2 (two) times daily for 5 days.    Dispense:  10 tablet    Refill:  0  . fluconazole (DIFLUCAN) 150 MG tablet    Sig: Take 1 tab po today, repeat in 7 days if needed    Dispense:  2 tablet    Refill:  0   No orders of the defined types were placed in this encounter.   Signed,  Maud Deed. Ziggy Chanthavong, MD   Outpatient Encounter Medications as of 12/05/2018  Medication Sig  . Ascorbic Acid (VITAMIN C PO) Take 1,000 mg by mouth daily.  . Cholecalciferol (VITAMIN D3 PO) Take 1,000  Units by mouth daily.  . clobetasol cream (TEMOVATE) [id number] % Apply 1 application topically 2 (two) times daily.  . fluticasone (FLONASE) 50 MCG/ACT nasal spray Place 2 sprays into both nostrils daily.  . magic mouthwash SOLN SWISH 5 ML BY MOUTH ,HOLD AND EXPECTORATE, REPEAT NO MORE THAN EVERY 4 HOURS  . meclizine (ANTIVERT) 25 MG tablet TAKE ONE TABLET BY MOUTH EVERY 8 HOURS AS NEEDED FOR DIZZINESS  . SYNTHROID 112 MCG tablet Take 1 tablet (112 mcg total) by mouth daily before breakfast.  . tolterodine (DETROL LA) 4 MG 24 hr capsule Take 1 capsule (4 mg total) by mouth daily.  . [DISCONTINUED] amoxicillin-clavulanate (AUGMENTIN) 875-125 MG tablet Take 1 tablet by mouth 2 (two) times daily for 10 days.  Marland Kitchen amoxicillin-clavulanate (AUGMENTIN) 875-125 MG tablet Take 1 tablet by mouth 2 (two) times daily for 5 days.  . fluconazole (DIFLUCAN) 150 MG tablet Take 1 tab po today, repeat in 7 days if needed   No facility-administered encounter medications on file as of 12/05/2018.

## 2018-12-05 ENCOUNTER — Encounter: Payer: Self-pay | Admitting: Family Medicine

## 2018-12-05 ENCOUNTER — Other Ambulatory Visit: Payer: Self-pay

## 2018-12-05 ENCOUNTER — Ambulatory Visit (INDEPENDENT_AMBULATORY_CARE_PROVIDER_SITE_OTHER): Payer: PPO | Admitting: Family Medicine

## 2018-12-05 VITALS — BP 130/70 | HR 65 | Temp 98.3°F | Ht 64.0 in | Wt 172.5 lb

## 2018-12-05 DIAGNOSIS — K5792 Diverticulitis of intestine, part unspecified, without perforation or abscess without bleeding: Secondary | ICD-10-CM

## 2018-12-05 DIAGNOSIS — B3731 Acute candidiasis of vulva and vagina: Secondary | ICD-10-CM

## 2018-12-05 DIAGNOSIS — B373 Candidiasis of vulva and vagina: Secondary | ICD-10-CM

## 2018-12-05 MED ORDER — AMOXICILLIN-POT CLAVULANATE 875-125 MG PO TABS: 1.0000 | ORAL_TABLET | Freq: Two times a day (BID) | ORAL | 0 refills | Status: AC

## 2018-12-05 MED ORDER — FLUCONAZOLE 150 MG PO TABS: ORAL_TABLET | ORAL | 0 refills | Status: DC

## 2018-12-07 ENCOUNTER — Ambulatory Visit: Admission: RE | Admit: 2018-12-07 | Payer: PPO | Source: Ambulatory Visit

## 2018-12-27 DIAGNOSIS — K5732 Diverticulitis of large intestine without perforation or abscess without bleeding: Secondary | ICD-10-CM | POA: Diagnosis not present

## 2019-01-18 ENCOUNTER — Other Ambulatory Visit: Payer: Self-pay | Admitting: Family Medicine

## 2019-02-08 ENCOUNTER — Ambulatory Visit
Admission: RE | Admit: 2019-02-08 | Discharge: 2019-02-08 | Disposition: A | Discharge status: Home / Self Care | Payer: PPO | Source: Ambulatory Visit | Attending: Family Medicine | Admitting: Family Medicine

## 2019-02-08 ENCOUNTER — Other Ambulatory Visit: Payer: Self-pay

## 2019-02-08 DIAGNOSIS — E2839 Other primary ovarian failure: Secondary | ICD-10-CM

## 2019-02-08 DIAGNOSIS — Z780 Asymptomatic menopausal state: Secondary | ICD-10-CM | POA: Diagnosis not present

## 2019-02-08 DIAGNOSIS — M85851 Other specified disorders of bone density and structure, right thigh: Secondary | ICD-10-CM | POA: Diagnosis not present

## 2019-02-08 DIAGNOSIS — Z1231 Encounter for screening mammogram for malignant neoplasm of breast: Secondary | ICD-10-CM

## 2019-05-10 ENCOUNTER — Ambulatory Visit: Payer: PPO | Attending: Internal Medicine

## 2019-05-10 DIAGNOSIS — Z23 Encounter for immunization: Secondary | ICD-10-CM | POA: Insufficient documentation

## 2019-05-10 NOTE — Progress Notes (Signed)
   Covid-19 Vaccination Clinic  Name:  Melissa Cox    MRN: [id number] DOB: 1950/12/14  05/10/2019  Melissa Cox was observed post Covid-19 immunization for 15 minutes without incidence. She was provided with Vaccine Information Sheet and instruction to access the V-Safe system.   Melissa Cox was instructed to call 911 with any severe reactions post vaccine: Marland Kitchen Difficulty breathing  . Swelling of your face and throat  . A fast heartbeat  . A bad rash all over your body  . Dizziness and weakness    Immunizations Administered    Name Date Dose VIS Date Route   Pfizer COVID-19 Vaccine 05/10/2019 12:13 PM 0.3 mL 03/08/2019 Intramuscular   Manufacturer: [place]   Lot: [id number]   [place]: [id number]

## 2019-05-23 ENCOUNTER — Other Ambulatory Visit: Payer: Self-pay | Admitting: Endocrinology

## 2019-06-02 ENCOUNTER — Ambulatory Visit: Payer: PPO | Attending: Internal Medicine

## 2019-06-02 DIAGNOSIS — Z23 Encounter for immunization: Secondary | ICD-10-CM

## 2019-06-02 NOTE — Progress Notes (Signed)
   Covid-19 Vaccination Clinic  Name:  Melissa Cox    MRN: [id number] DOB: September 07, 1950  06/02/2019  [name] was observed post Covid-19 immunization for 15 minutes without incident. She was provided with Vaccine Information Sheet and instruction to access the V-Safe system.   [name] was instructed to call 911 with any severe reactions post vaccine: Marland Kitchen Difficulty breathing  . Swelling of face and throat  . A fast heartbeat  . A bad rash all over body  . Dizziness and weakness   Immunizations Administered    Name Date Dose VIS Date Route   Pfizer COVID-19 Vaccine 06/02/2019 11:06 AM 0.3 mL 03/08/2019 Intramuscular   Manufacturer: [place]   Lot: [id number]   [place]: [id number]

## 2019-06-05 ENCOUNTER — Other Ambulatory Visit: Payer: PPO

## 2019-06-07 ENCOUNTER — Ambulatory Visit: Payer: PPO | Admitting: Endocrinology

## 2019-06-17 ENCOUNTER — Telehealth: Payer: Self-pay

## 2019-06-17 NOTE — Telephone Encounter (Signed)
Melissa Cox is due for her CPE in July.  Will discuss this with patient at that office visit.

## 2019-06-17 NOTE — Telephone Encounter (Signed)
Fax received from [employer] that patient is due for Cologuard 2021. Will forward to Butch Penny as this is a Dr. Lorelei Pont patient.

## 2019-07-15 ENCOUNTER — Other Ambulatory Visit: Payer: Self-pay | Admitting: Endocrinology

## 2019-07-15 ENCOUNTER — Telehealth: Payer: Self-pay

## 2019-07-15 DIAGNOSIS — E559 Vitamin D deficiency, unspecified: Secondary | ICD-10-CM

## 2019-07-15 DIAGNOSIS — E890 Postprocedural hypothyroidism: Secondary | ICD-10-CM

## 2019-07-15 NOTE — Telephone Encounter (Signed)
Called pt and left detailed voicemail with MD message. 

## 2019-07-15 NOTE — Telephone Encounter (Signed)
Would prefer to have labs done before the visit, okay to have them drawn with PCP

## 2019-07-15 NOTE — Telephone Encounter (Signed)
Patient called to schedule her 1 year thyroid follow up.  Patient would like to know if she needs labs prior to the visit or at the time of the visit?

## 2019-07-16 ENCOUNTER — Other Ambulatory Visit: Payer: PPO

## 2019-07-18 ENCOUNTER — Other Ambulatory Visit: Payer: Self-pay

## 2019-07-18 ENCOUNTER — Other Ambulatory Visit (INDEPENDENT_AMBULATORY_CARE_PROVIDER_SITE_OTHER): Payer: PPO

## 2019-07-18 DIAGNOSIS — E559 Vitamin D deficiency, unspecified: Secondary | ICD-10-CM | POA: Diagnosis not present

## 2019-07-18 DIAGNOSIS — E890 Postprocedural hypothyroidism: Secondary | ICD-10-CM

## 2019-07-18 LAB — VITAMIN D 25 HYDROXY (VIT D DEFICIENCY, FRACTURES): VITD: 29.36 ng/mL — ABNORMAL LOW (ref 30.00–100.00)

## 2019-07-18 LAB — TSH: TSH: 0.19 u[IU]/mL — ABNORMAL LOW (ref 0.35–4.50)

## 2019-07-18 LAB — T4, FREE: Free T4: 1.12 ng/dL (ref 0.60–1.60)

## 2019-07-23 ENCOUNTER — Encounter: Payer: Self-pay | Admitting: Endocrinology

## 2019-07-23 ENCOUNTER — Ambulatory Visit: Payer: PPO | Admitting: Endocrinology

## 2019-07-23 ENCOUNTER — Other Ambulatory Visit: Payer: Self-pay

## 2019-07-23 VITALS — BP 140/82 | HR 67 | Ht 64.0 in | Wt 176.2 lb

## 2019-07-23 DIAGNOSIS — E890 Postprocedural hypothyroidism: Secondary | ICD-10-CM

## 2019-07-23 MED ORDER — LEVOTHYROXINE SODIUM 100 MCG PO TABS: 100.0000 ug | ORAL_TABLET | Freq: Every day | ORAL | 2 refills | Status: DC

## 2019-07-23 MED ORDER — SYNTHROID 100 MCG PO TABS: 100.0000 ug | ORAL_TABLET | Freq: Every day | ORAL | 2 refills | Status: DC

## 2019-07-23 NOTE — Patient Instructions (Signed)
Vitamin D in am also

## 2019-07-23 NOTE — Progress Notes (Signed)
Patient ID: Melissa Cox, female   DOB: 03-17-1951, 69 y.o.   MRN: [id number]   Reason for Appointment:  Hypothyroidism, followup visit    History of Present Illness:   The hypothyroidism was first diagnosed in 1977 after treatment of her Graves' disease with I-131  The patient has been treated with levothyroxine in varying doses, ranging from 100 up to 137 mcg  In 5/16 she had a high TSH level with using generic levothyroxine At that time she had been complaining of some fatigue and with increasing her dose to 112 g she started having a little better energy  She is taking brand name Synthroid  Her dose of 112 mcg has been continued unchanged for some time She is quite regular with taking her thyroid supplement before breakfast           Not taking any calcium or iron supplements with the thyroid supplement  She does not complain of feeling tired, has had no recent weight change She may feel a little shakiness at times Also at times has a feeling of strong heartbeat which may be irregular but has had this for several months and is intermittent. Not unusually anxious or nervous    Her TSH level is again normal   Lab Results  Component Value Date   TSH 0.19 (L) 07/18/2019   TSH 0.26 (L) 11/26/2018   TSH 1.66 05/28/2018   FREET4 1.12 07/18/2019   FREET4 1.19 11/26/2018   FREET4 1.02 05/28/2018    Wt Readings from Last 3 Encounters:  07/23/19 176 lb 3.2 oz (79.9 kg)  12/05/18 172 lb 8 oz (78.2 kg)  11/27/18 176 lb 14.4 oz (80.2 kg)     Allergies as of 07/23/2019      Reactions   Demerol [meperidine] Rash   Meperidine Hcl    REACTION: rash   Diphenhydramine Hcl    REACTION: abd cramps and diarrhea   Doxycycline Nausea Only   Epinephrine Other (See Comments)   Increased heart rate    Prochlorperazine Edisylate    REACTION: increased heart rate- compazine       Medication List       Accurate as of July 23, 2019  8:18 AM. If you have any  questions, ask your nurse or doctor.        STOP taking these medications   fluconazole 150 MG tablet Commonly known as: DIFLUCAN Stopped by: Elayne Snare, MD   tolterodine 4 MG 24 hr capsule Commonly known as: DETROL LA Stopped by: Elayne Snare, MD     TAKE these medications   clobetasol cream 0.05 % Commonly known as: TEMOVATE Apply 1 application topically 2 (two) times daily.   fluticasone 50 MCG/ACT nasal spray Commonly known as: FLONASE SPRAY 2 SPRAYS INTO EACH NOSTRIL EVERY DAY   magic mouthwash Soln SWISH 5 ML BY MOUTH ,HOLD AND EXPECTORATE, REPEAT NO MORE THAN EVERY 4 HOURS   meclizine 25 MG tablet Commonly known as: ANTIVERT TAKE ONE TABLET BY MOUTH EVERY 8 HOURS AS NEEDED FOR DIZZINESS   Synthroid 112 MCG tablet Generic drug: levothyroxine TAKE 1 TABLET (112 MCG TOTAL) BY MOUTH DAILY BEFORE BREAKFAST.   VITAMIN C PO Take 1,000 mg by mouth daily.   VITAMIN D3 PO Take 1,000 Units by mouth daily.       Allergies:  Allergies  Allergen Reactions  . Demerol [Meperidine] Rash  . Meperidine Hcl     REACTION: rash  . Diphenhydramine Hcl  REACTION: abd cramps and diarrhea  . Doxycycline Nausea Only  . Epinephrine Other (See Comments)    Increased heart rate   . Prochlorperazine Edisylate     REACTION: increased heart rate- compazine     Past Medical History:  Diagnosis Date  . Anemia    years ago - had hysterectomy to stop - hgb 5.2  . Arthritis    hands  . Blood transfusion without reported diagnosis    prior to hysterectomy  . Cataract    beginnings   . Diverticulitis 2018  . GERD (gastroesophageal reflux disease)    occ -uses TUMS  . Lichen sclerosus   . 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 Unspecified essential hypertension 10/21/2008    Past Surgical History:  Procedure Laterality Date  . ABDOMINAL HYSTERECTOMY  [id number]   partial, fibroids  . BREAST LUMPECTOMY       right,benign  . MENISCUS REPAIR Left     Family History  Problem Relation Age of Onset  . Goiter Mother   . Heart disease Father   . Hypothyroidism Sister   . Stomach cancer Sister   . Colon cancer Maternal Grandmother   . Colon polyps Neg Hx   . Esophageal cancer Neg Hx   . Rectal cancer Neg Hx     Social History:  reports that she has never smoked. She has never used smokeless tobacco. She reports current alcohol use. She reports that she does not use drugs.  REVIEW Of SYSTEMS:   Her baseline vitamin D level was 18 and has not been taking her supplement again recently, Her current level is near 30 again Currently using 1000 units supplements   Her blood pressure Has been periodically high  BP Readings from Last 3 Encounters:  07/23/19 140/82  12/05/18 130/70  11/28/18 (!) 142/83     Examination:   BP 140/82 (BP Location: Left Arm, Patient Position: Sitting, Cuff Size: Normal)   Pulse 67   Ht 5\' 4"  (1.626 m)   Wt 176 lb 3.2 oz (79.9 kg)   SpO2 98%   BMI 30.24 kg/m   She looks well Thyroid not palpable Minimal tremor on the left Heart sounds normal and regular  Biceps reflexes are normal No peripheral edema She has a small bony prominence on the volar surface of the left wrist    Assessment   Hypothyroidism, post ablative, long-standing. Her TSH is now lower than usual at 0.16 Although she is having some palpitations these are intermittent and likely related to ectopic atrial beats and not present on exam today She has no other symptoms She looks euthyroid on exam    She takes 112 g of brand name Synthroid and will now be needing to reduce her dose  Vitamin D deficiency: She is still irregular with her supplement and her level is below 30       Treatment:  She will reduce her dosage to 100 mcg of brand-name Synthroid instead of 112  Recheck in 2 months Make sure she takes her Synthroid on empty stomach in the morning regularly To take her  vitamin D at the same time  She needs to see her PCP to evaluate her palpitations  There are no Patient Instructions on file for this visit.   Elayne Snare 07/23/2019, 8:18 AM

## 2019-09-23 ENCOUNTER — Other Ambulatory Visit (INDEPENDENT_AMBULATORY_CARE_PROVIDER_SITE_OTHER): Payer: PPO

## 2019-09-23 ENCOUNTER — Other Ambulatory Visit: Payer: Self-pay

## 2019-09-23 DIAGNOSIS — E890 Postprocedural hypothyroidism: Secondary | ICD-10-CM

## 2019-09-23 LAB — T4, FREE: Free T4: 0.92 ng/dL (ref 0.60–1.60)

## 2019-09-23 LAB — TSH: TSH: 1.11 u[IU]/mL (ref 0.35–4.50)

## 2019-09-26 ENCOUNTER — Other Ambulatory Visit: Payer: Self-pay

## 2019-09-26 ENCOUNTER — Ambulatory Visit: Payer: PPO | Admitting: Endocrinology

## 2019-09-26 ENCOUNTER — Encounter: Payer: Self-pay | Admitting: Endocrinology

## 2019-09-26 VITALS — BP 124/68 | HR 77 | Ht 64.0 in | Wt 174.6 lb

## 2019-09-26 DIAGNOSIS — E890 Postprocedural hypothyroidism: Secondary | ICD-10-CM

## 2019-09-26 DIAGNOSIS — E559 Vitamin D deficiency, unspecified: Secondary | ICD-10-CM

## 2019-09-26 NOTE — Progress Notes (Signed)
Patient ID: Melissa Cox, female   DOB: March 26, 1951, 69 y.o.   MRN: [id number]   Reason for Appointment:  Hypothyroidism, followup visit    History of Present Illness:   The hypothyroidism was first diagnosed in 1977 after treatment of her Graves' disease with I-131  The patient has been treated with levothyroxine in varying doses, ranging from 100 up to 137 mcg  In 5/16 she had a high TSH level with using generic levothyroxine At that time she had been complaining of some fatigue and with increasing her dose to 112 g she started having a little better energy  She is taking brand name Synthroid  Her dose of 112 mcg had been continued unchanged for some time; however in 4/21 since her TSH was normal she was changed to 100 mcg of Synthroid She also had a relatively low TSH in 10/2008 by PCP  Previously was complaining of a feeling of strong heartbeat which may be irregular but had this for several months and was intermittent. This is now better and practically resolved with her new dose of 100 mcg Synthroid  She feels good with her energy level, has had no recent weight change No heat or cold intolerance or skin changes    Her TSH level is back to normal  She is quite regular with taking her thyroid supplement before breakfast           Not taking any calcium or iron supplements with the thyroid supplement  Lab Results  Component Value Date   TSH 1.11 09/23/2019   TSH 0.19 (L) 07/18/2019   TSH 0.26 (L) 11/26/2018   FREET4 0.92 09/23/2019   FREET4 1.12 07/18/2019   FREET4 1.19 11/26/2018    Wt Readings from Last 3 Encounters:  09/26/19 174 lb 9.6 oz (79.2 kg)  07/23/19 176 lb 3.2 oz (79.9 kg)  12/05/18 172 lb 8 oz (78.2 kg)     Allergies as of 09/26/2019      Reactions   Demerol [meperidine] Rash   Meperidine Hcl    REACTION: rash   Diphenhydramine Hcl    REACTION: abd cramps and diarrhea   Doxycycline Nausea Only   Epinephrine Other (See Comments)    Increased heart rate    Prochlorperazine Edisylate    REACTION: increased heart rate- compazine       Medication List       Accurate as of September 26, 2019  9:20 AM. If you have any questions, ask your nurse or doctor.        clobetasol cream 0.05 % Commonly known as: TEMOVATE Apply 1 application topically 2 (two) times daily.   fluticasone 50 MCG/ACT nasal spray Commonly known as: FLONASE SPRAY 2 SPRAYS INTO EACH NOSTRIL EVERY DAY   magic mouthwash Soln SWISH 5 ML BY MOUTH ,HOLD AND EXPECTORATE, REPEAT NO MORE THAN EVERY 4 HOURS   meclizine 25 MG tablet Commonly known as: ANTIVERT TAKE ONE TABLET BY MOUTH EVERY 8 HOURS AS NEEDED FOR DIZZINESS   Synthroid 100 MCG tablet Generic drug: levothyroxine Take 1 tablet (100 mcg total) by mouth daily before breakfast.   VITAMIN C PO Take 1,000 mg by mouth daily.   VITAMIN D3 PO Take 1,000 Units by mouth daily.       Allergies:  Allergies  Allergen Reactions  . Demerol [Meperidine] Rash  . Meperidine Hcl     REACTION: rash  . Diphenhydramine Hcl     REACTION: abd cramps and diarrhea  .  Doxycycline Nausea Only  . Epinephrine Other (See Comments)    Increased heart rate   . Prochlorperazine Edisylate     REACTION: increased heart rate- compazine     Past Medical History:  Diagnosis Date  . Anemia    years ago - had hysterectomy to stop - hgb 5.2  . Arthritis    hands  . Blood transfusion without reported diagnosis    prior to hysterectomy  . Cataract    beginnings   . Diverticulitis 2018  . GERD (gastroesophageal reflux disease)    occ -uses TUMS  . Lichen sclerosus   . 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 Unspecified essential hypertension 10/21/2008    Past Surgical History:  Procedure Laterality Date  . ABDOMINAL HYSTERECTOMY  7616,0737   partial, fibroids  . BREAST LUMPECTOMY     right,benign  . MENISCUS REPAIR Left     Family  History  Problem Relation Age of Onset  . Goiter Mother   . Heart disease Father   . Hypothyroidism Sister   . Stomach cancer Sister   . Colon cancer Maternal Grandmother   . Colon polyps Neg Hx   . Esophageal cancer Neg Hx   . Rectal cancer Neg Hx     Social History:  reports that she has never smoked. She has never used smokeless tobacco. She reports current alcohol use. She reports that she does not use drugs.  REVIEW Of SYSTEMS:   Her baseline vitamin D level was 18 Previously had not been taking her supplement again recently, Her last level is near 30 again Currently using 1000 units vitamin D3 supplements Since her last visit she has taken her vitamin D3 along with her Synthroid and is more regular with this now   Her blood pressure recently appears to be improved  BP Readings from Last 3 Encounters:  09/26/19 124/68  07/23/19 140/82  12/05/18 130/70     Examination:   BP 124/68 (BP Location: Left Arm, Patient Position: Sitting)   Pulse 77   Ht 5\' 4"  (1.626 m)   Wt 174 lb 9.6 oz (79.2 kg)   SpO2 98%   BMI 29.97 kg/m   She looks well Systemic exam not indicated     Assessment   Hypothyroidism, post ablative, long-standing. She has required lower doses of Synthroid more recently, previously has been on as high as 137 mcg Now taking 100 mcg brand-name Synthroid  She thinks her palpitations are better with reducing her Synthroid Has no other symptoms  Vitamin D deficiency: She is now more regular with her supplement and her level will be checked on the next visit       Treatment:  She will stay on brand-name Synthroid 100 mcg Follow-up in 6 months    There are no Patient Instructions on file for this visit.   Elayne Snare 09/26/2019, 9:20 AM

## 2019-10-18 ENCOUNTER — Other Ambulatory Visit: Payer: Self-pay | Admitting: Endocrinology

## 2020-01-29 ENCOUNTER — Other Ambulatory Visit: Payer: Self-pay

## 2020-01-29 ENCOUNTER — Ambulatory Visit (INDEPENDENT_AMBULATORY_CARE_PROVIDER_SITE_OTHER): Payer: PPO | Admitting: Family Medicine

## 2020-01-29 ENCOUNTER — Encounter: Payer: Self-pay | Admitting: Family Medicine

## 2020-01-29 VITALS — BP 170/84 | HR 70 | Temp 98.5°F | Ht 64.0 in | Wt 178.5 lb

## 2020-01-29 DIAGNOSIS — L84 Corns and callosities: Secondary | ICD-10-CM

## 2020-01-29 DIAGNOSIS — M21622 Bunionette of left foot: Secondary | ICD-10-CM | POA: Diagnosis not present

## 2020-01-29 DIAGNOSIS — H6592 Unspecified nonsuppurative otitis media, left ear: Secondary | ICD-10-CM

## 2020-01-29 MED ORDER — CLOBETASOL PROPIONATE 0.05 % EX CREA: 1.0000 "application " | TOPICAL_CREAM | Freq: Two times a day (BID) | CUTANEOUS | 1 refills | Status: DC

## 2020-01-29 MED ORDER — AMOXICILLIN 875 MG PO TABS: 875.0000 mg | ORAL_TABLET | Freq: Two times a day (BID) | ORAL | 0 refills | Status: AC

## 2020-01-29 NOTE — Patient Instructions (Signed)
Get some Urea cream - ideally 40%  Go to [facility] and look up:  "Urea cream 40%"  "Pumice stone for feet"  Use that for about a week, and then use a PUMICE stone to gently work on the callus

## 2020-01-29 NOTE — Progress Notes (Signed)
Melissa Jetter T. Loranzo Desha, MD, [facility] [place], [address]  Phone: [phone]  FAX: [place] - 69 y.o. female  MRN [id number]  Date of Birth: 10-Aug-1950  Date: 01/29/2020  PCP: Owens Loffler, MD  Referral: Owens Loffler, MD  Chief Complaint  Patient presents with  . Ear Pain    Left  . Hard Area on Left Foot    Referral to Podiatry    This visit occurred during the SARS-CoV-2 public health emergency.  Safety protocols were in place, including screening questions prior to the visit, additional usage of staff PPE, and extensive cleaning of exam room while observing appropriate contact time as indicated for disinfecting solutions.   Subjective:   Melissa Cox is a 69 y.o. very pleasant female patient with Body mass index is 30.64 kg/m. who presents with the following:  L corner of jaw and ear ache really bad, all over the l ear.  She woke up this morning and she was having some relatively significant tenderness around the ear just inferior to this, along the left anterior cervical chain and just behind the ear.  She did not have any trauma.  She has not been sick otherwise or recently.  She has not had any recent allergy flareup.  The ear pain is fairly isolated to the ear and she has a fullness.  She also has some questions about a left bunionette that is painful with some callus formation.  Left foot   Review of Systems is noted in the HPI, as appropriate  Objective:   BP (!) 170/84   Pulse 70   Temp 98.5 F (36.9 C) (Temporal)   Ht 5\' 4"  (1.626 m)   Wt 178 lb 8 oz (81 kg)   SpO2 97%   BMI 30.64 kg/m   GEN: No acute distress; alert,appropriate. PULM: Breathing comfortably in no respiratory distress PSYCH: Normally interactive.  Right TM appears clear with some minimal fluid.  The left TM is bulging with fluid throughout and  some decreased visibility of the anatomy. Left cervical anterior chain lymphadenopathy  Left foot has an obvious bunionette with a callus on it.  This is tender to palpation.  Laboratory and Imaging Data:  Assessment and Plan:     ICD-10-CM   1. Left otitis media with effusion  H65.92   2. Bunionette of left foot  M21.622   3. Callus of foot  L84    Otitis media with effusion.  New problem.  Cervical lymphadenopathy.  Amoxicillin twice daily.  Painful bunionette with callus.  Urea cream.  Salicylic acid.  Pumice stone.  Patient Instructions  Get some Urea cream - ideally 40%  Go to [facility] and look up:  "Urea cream 40%"  "Pumice stone for feet"  Use that for about a week, and then use a PUMICE stone to gently work on the callus    Meds ordered this encounter  Medications  . clobetasol cream (TEMOVATE) 0.05 %    Sig: Apply 1 application topically 2 (two) times daily.    Dispense:  30 g    Refill:  1  . amoxicillin (AMOXIL) 875 MG tablet    Sig: Take 1 tablet (875 mg total) by mouth 2 (two) times daily for 10 days.    Dispense:  20 tablet    Refill:  0   Medications Discontinued During This Encounter  Medication Reason  . clobetasol cream (TEMOVATE) 0.05 % Reorder   No orders of the defined types were placed in this encounter.   Follow-up: No follow-ups on file.  Signed,  Maud Deed. Priscella Donna, MD   Outpatient Encounter Medications as of 01/29/2020  Medication Sig  . Ascorbic Acid (VITAMIN C PO) Take 1,000 mg by mouth daily.  . Cholecalciferol (VITAMIN D3 PO) Take 1,000 Units by mouth daily.  . clobetasol cream (TEMOVATE) 3.33 % Apply 1 application topically 2 (two) times daily.  . fluticasone (FLONASE) 50 MCG/ACT nasal spray SPRAY 2 SPRAYS INTO EACH NOSTRIL EVERY DAY  . magic mouthwash SOLN SWISH 5 ML BY MOUTH ,HOLD AND EXPECTORATE, REPEAT NO MORE THAN EVERY 4 HOURS  . meclizine (ANTIVERT) 25 MG tablet TAKE ONE TABLET BY MOUTH EVERY 8 HOURS AS NEEDED FOR  DIZZINESS  . SYNTHROID 100 MCG tablet TAKE 1 TABLET BY MOUTH DAILY BEFORE BREAKFAST.  . [DISCONTINUED] clobetasol cream (TEMOVATE) 8.32 % Apply 1 application topically 2 (two) times daily.  Marland Kitchen amoxicillin (AMOXIL) 875 MG tablet Take 1 tablet (875 mg total) by mouth 2 (two) times daily for 10 days.   No facility-administered encounter medications on file as of 01/29/2020.

## 2020-02-03 ENCOUNTER — Other Ambulatory Visit: Payer: Self-pay

## 2020-02-03 ENCOUNTER — Encounter: Payer: PPO | Admitting: Family Medicine

## 2020-02-03 ENCOUNTER — Encounter: Payer: Self-pay | Admitting: Family Medicine

## 2020-02-03 ENCOUNTER — Telehealth: Payer: Self-pay | Admitting: *Deleted

## 2020-02-03 DIAGNOSIS — M21622 Bunionette of left foot: Secondary | ICD-10-CM

## 2020-02-03 DIAGNOSIS — L84 Corns and callosities: Secondary | ICD-10-CM

## 2020-02-03 NOTE — Telephone Encounter (Signed)
Ms. Verne states she is have an issue with her foot that she spoke to Dr. Lorelei Pont about.  He recommended that she use a cream but Shanta would like a referral to a podiatrist.  Please advise.

## 2020-02-03 NOTE — Telephone Encounter (Signed)
done

## 2020-02-04 NOTE — Progress Notes (Signed)
This encounter was created in error - please disregard.

## 2020-02-05 ENCOUNTER — Other Ambulatory Visit: Payer: Self-pay | Admitting: Podiatry

## 2020-02-05 ENCOUNTER — Other Ambulatory Visit: Payer: Self-pay

## 2020-02-05 ENCOUNTER — Ambulatory Visit: Payer: PPO | Admitting: Podiatry

## 2020-02-05 ENCOUNTER — Encounter: Payer: Self-pay | Admitting: Podiatry

## 2020-02-05 ENCOUNTER — Ambulatory Visit (INDEPENDENT_AMBULATORY_CARE_PROVIDER_SITE_OTHER): Payer: PPO

## 2020-02-05 DIAGNOSIS — M7752 Other enthesopathy of left foot: Secondary | ICD-10-CM | POA: Diagnosis not present

## 2020-02-05 DIAGNOSIS — M21622 Bunionette of left foot: Secondary | ICD-10-CM

## 2020-02-05 DIAGNOSIS — M2012 Hallux valgus (acquired), left foot: Secondary | ICD-10-CM

## 2020-02-05 NOTE — Patient Instructions (Signed)
Look for Voltaren gel at the pharmacy over the counter or online (also known as diclofenac 1% gel). Apply to the painful areas 3-4x daily with the supplied dosing card. Allow to dry for 10 minutes before going into socks/shoes  Look for urea 40% cream or ointment and apply to the thickened dry skin / calluses. This can be bought over the counter, at a pharmacy or online such as [employer].  More silicone pads can be purchased from:  [email or web address]     Dancer's pads with orthopedic felt are the type of pad I gave you. You can often get these on [place] or the web

## 2020-02-08 NOTE — Progress Notes (Signed)
  Subjective:  Patient ID: Melissa Cox, female    DOB: 02-10-1951,  MRN: [id number]  Chief Complaint  Patient presents with  . Foot Pain    Patient presents today for left foot pain at 5th met to top of foot.  She says it started 2-3 weeks ago and this past Friday, became very painful and swelling.  Today it feels better, just sore    She also c/o painful callous to bottom of left 5th sub met  . Callouses    69 y.o. female presents with the above complaint. History confirmed with patient. Here with her husband today  Objective:  Physical Exam: warm, good capillary refill, no trophic changes or ulcerative lesions, normal DP and PT pulses and normal sensory exam. Left Foot: She has mild tailor's bunion deformity, there is tenderness lateral to this, there is a callus submet five and this is painful as well.  Radiographs: X-ray of the left foot: Mild tailor's bunion deformity with increased intermetatarsal angle and hypertrophy of the lateral metatarsal head Assessment:   1. Tailor's bunionette, left   2. Bursitis of left foot      Plan:  Patient was evaluated and treated and all questions answered.  Discussed the etiology and treatment options in detail tailor bunion deformity with her. I recommended that she apply urea cream and use a pumice stone on the plantar callus. I dispensed a silicone tailor's bunion shield to alleviate pressure and pain here today. We discussed that if these measures are not successful we could consider surgical correction which she would prefer to hold off on this and I think this is reasonable. She may have some bursitis currently but appears to be resolving. I advised her that if this ever gets really painful for her we can always put some cortisone in the area.  Return if symptoms worsen or fail to improve.

## 2020-03-15 ENCOUNTER — Emergency Department (HOSPITAL_COMMUNITY)
Admission: EM | Admit: 2020-03-15 | Discharge: 2020-03-15 | Disposition: A | Discharge status: Home / Self Care | Payer: PPO | Attending: Emergency Medicine | Admitting: Emergency Medicine

## 2020-03-15 ENCOUNTER — Encounter (HOSPITAL_COMMUNITY): Payer: Self-pay | Admitting: Emergency Medicine

## 2020-03-15 ENCOUNTER — Other Ambulatory Visit: Payer: Self-pay

## 2020-03-15 DIAGNOSIS — R110 Nausea: Secondary | ICD-10-CM | POA: Diagnosis not present

## 2020-03-15 DIAGNOSIS — T7840XA Allergy, unspecified, initial encounter: Secondary | ICD-10-CM | POA: Insufficient documentation

## 2020-03-15 DIAGNOSIS — I10 Essential (primary) hypertension: Secondary | ICD-10-CM | POA: Insufficient documentation

## 2020-03-15 MED ORDER — EPINEPHRINE 0.3 MG/0.3ML IJ SOAJ: 0.3000 mg | INTRAMUSCULAR | 0 refills | Status: DC | PRN

## 2020-03-15 MED ORDER — DEXAMETHASONE SODIUM PHOSPHATE 10 MG/ML IJ SOLN
10.0000 mg | Freq: Once | INTRAMUSCULAR | Status: AC
  Administered 2020-03-15: 01:00:00 10 mg via INTRAMUSCULAR
  Filled 2020-03-15: qty 1

## 2020-03-15 NOTE — ED Provider Notes (Signed)
[facility] [facility] EMERGENCY DEPARTMENT Provider Note   CSN: [id number] Arrival date & time: 03/15/20  0016     History Chief Complaint  Patient presents with   Allergic Reaction    Melissa Cox is a 69 y.o. female.  HPI Patient presents with possible allergic reaction.  States that around 9:00 last night began to develop itchiness in her feet.  States she has had episodes of this before.  Thinks that she may have an allergy.  Thinks that it could be related to a red meat allergy.  States she has previously been around takes.  States that it moved up to her abdomen from her legs with itchiness and a raised rash.  States her hands also went up to the forearms.  States she took some Benadryl at home.  Had slight nausea but no vomiting.  Has had episodes like this before.  Has not had difficulty breathing with the episodes.  No fevers.  States she did develop shakes though.  States that she is feeling better now.  Does not approximately 8-1/2-hour delay between arrival and being seen by me.  Had also been given IM Decadron.    Past Medical History:  Diagnosis Date   Anemia    years ago - had hysterectomy to stop - hgb 5.2   Arthritis    hands   Blood transfusion without reported diagnosis    prior to hysterectomy   Cataract    beginnings    Diverticulitis 2018   GERD (gastroesophageal reflux disease)    occ -uses TUMS   Lichen sclerosus    Thyrotoxicosis without mention of goiter or other cause, without mention of thyrotoxic crisis or storm    Unspecified essential hypertension 10/21/2008    Patient Active Problem List   Diagnosis Date Noted   Diverticulitis 11/26/2018   Lymphadenitis 12/08/2016   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   Unspecified vitamin D deficiency 05/17/2013    Past Surgical History:  Procedure Laterality Date   ABDOMINAL HYSTERECTOMY  [id number]   partial, fibroids   BREAST LUMPECTOMY      right,benign   MENISCUS REPAIR Left      OB History   No obstetric history on file.     Family History  Problem Relation Age of Onset   Goiter Mother    Heart disease Father    Hypothyroidism Sister    Stomach cancer Sister    Colon cancer Maternal Grandmother    Colon polyps Neg Hx    Esophageal cancer Neg Hx    Rectal cancer Neg Hx     Social History   Tobacco Use   Smoking status: Never Smoker   Smokeless tobacco: Never Used  Vaping Use   Vaping Use: Never used  Substance Use Topics   Alcohol use: Yes    Comment: occ glass of wine   Drug use: No    Home Medications Prior to Admission medications   Medication Sig Start Date End Date Taking? Authorizing Provider  Ascorbic Acid (VITAMIN C PO) Take 1,000 mg by mouth daily.    [provider]  Cholecalciferol (VITAMIN D3 PO) Take 1,000 Units by mouth daily.    [provider]  clobetasol cream (TEMOVATE) 3.71 % Apply 1 application topically 2 (two) times daily. 01/29/20   Copland, Frederico Hamman, MD  EPINEPHrine 0.3 mg/0.3 mL IJ SOAJ injection Inject 0.3 mg into the muscle as needed for anaphylaxis. 03/15/20   Davonna Belling, MD  fluticasone (FLONASE) 50 MCG/ACT  nasal spray SPRAY 2 SPRAYS INTO EACH NOSTRIL EVERY DAY 01/18/19   Copland, Frederico Hamman, MD  magic mouthwash SOLN SWISH 5 ML BY MOUTH ,HOLD AND EXPECTORATE, REPEAT NO MORE THAN EVERY 4 HOURS 08/08/17   [provider]  meclizine (ANTIVERT) 25 MG tablet TAKE ONE TABLET BY MOUTH EVERY 8 HOURS AS NEEDED FOR DIZZINESS 09/04/17   Copland, Frederico Hamman, MD  SYNTHROID 100 MCG tablet TAKE 1 TABLET BY MOUTH DAILY BEFORE BREAKFAST. 10/18/19   Elayne Snare, MD    Allergies    Demerol [meperidine], Meperidine hcl, Diphenhydramine hcl, Doxycycline, Epinephrine, and Prochlorperazine edisylate  Review of Systems   Review of Systems  Constitutional: Negative for appetite change, fatigue and fever.  HENT: Negative for congestion.   Respiratory:  Negative for cough and shortness of breath.   Gastrointestinal: Positive for nausea. Negative for abdominal pain.  Endocrine: Negative for polyuria.  Genitourinary: Negative for flank pain.  Musculoskeletal: Negative for back pain.  Skin: Positive for rash.  Neurological: Negative for weakness.  Psychiatric/Behavioral: Negative for confusion.    Physical Exam Updated Vital Signs BP 138/90 (BP Location: Right Arm)    Pulse 97    Temp 98.3 F (36.8 C) (Oral)    Resp 20    SpO2 97%   Physical Exam Vitals and nursing note reviewed.  HENT:     Head: Normocephalic.     Mouth/Throat:     Pharynx: No oropharyngeal exudate or posterior oropharyngeal erythema.  Eyes:     Pupils: Pupils are equal, round, and reactive to light.  Cardiovascular:     Rate and Rhythm: Normal rate.  Pulmonary:     Breath sounds: No wheezing or rhonchi.  Abdominal:     Tenderness: There is no abdominal tenderness.  Musculoskeletal:        General: No tenderness.     Cervical back: Neck supple.  Skin:    General: Skin is warm.     Capillary Refill: Capillary refill takes less than 2 seconds.     Findings: No rash.  Neurological:     Mental Status: She is alert and oriented to person, place, and time.  Psychiatric:        Mood and Affect: Mood normal.     ED Results / Procedures / Treatments   Labs (all labs ordered are listed, but only abnormal results are displayed) Labs Reviewed - No data to display  EKG None  Radiology No results found.  Procedures Procedures (including critical care time)  Medications Ordered in ED Medications  dexamethasone (DECADRON) injection 10 mg (10 mg Intramuscular Given 03/15/20 0037)    ED Course  I have reviewed the triage vital signs and the nursing notes.  Pertinent labs & imaging results that were available during my care of the patient were reviewed by me and considered in my medical decision making (see chart for details).    MDM  Rules/Calculators/A&P                          Patient with allergic reaction likely to food.  Has had episodes seen in the past.  Began on feet and moved up proximally.  No fevers but did have shaking.  No other signs of infection.  No difficulty breathing.  It appears that the steroids and Benadryl have helped.  However states that this reaction was worse than when she has had previously.  With worsening reactions will give EpiPen for follow-up.  Follow-up with PCP  and potentially allergy.  Will return if symptoms worsen or return.  Discharge home. With this shaking I have considered infection, however no urinary symptoms no respiratory symptoms has a history of diverticulitis but no abdominal tenderness.  Instructed on possibility of recurrence of diverticulitis on the steroids.  Discharge home Final Clinical Impression(s) / ED Diagnoses Final diagnoses:  Allergic reaction, initial encounter    Rx / DC Orders ED Discharge Orders         Ordered    EPINEPHrine 0.3 mg/0.3 mL IJ SOAJ injection  As needed        03/15/20 0858           Davonna Belling, MD 03/15/20 [phone]

## 2020-03-15 NOTE — Discharge Instructions (Signed)
Follow-up with your doctor.  You may need to see an allergist.  Return for worsening symptoms.

## 2020-03-15 NOTE — ED Triage Notes (Signed)
Pt presents to ED POV. Pt c/o hives, itching, nauseous and shaking. Pt does not know allergy trigger. Pt taken 25mg  benadryl at home and used hydrocortisone cream.

## 2020-03-15 NOTE — ED Notes (Signed)
Patient given discharge instructions. Questions were answered. Patient verbalized understanding of discharge instructions and care at home.  

## 2020-03-17 ENCOUNTER — Encounter: Payer: Self-pay | Admitting: Family Medicine

## 2020-03-17 ENCOUNTER — Ambulatory Visit (INDEPENDENT_AMBULATORY_CARE_PROVIDER_SITE_OTHER): Payer: PPO | Admitting: Family Medicine

## 2020-03-17 ENCOUNTER — Other Ambulatory Visit: Payer: Self-pay

## 2020-03-17 VITALS — BP 138/82 | HR 70 | Temp 97.4°F | Ht 64.0 in | Wt 177.5 lb

## 2020-03-17 DIAGNOSIS — T7840XD Allergy, unspecified, subsequent encounter: Secondary | ICD-10-CM | POA: Diagnosis not present

## 2020-03-17 NOTE — Patient Instructions (Signed)
For now:  Allegra take 1 tablet every day.  (Generic fexofenadine)   But if your have any inkling of new rash.

## 2020-03-17 NOTE — Progress Notes (Signed)
Melissa Theiler T. [name], MD, Melissa Cox at [facility] [place], [address]  Phone: [phone]  FAX: [place] - 69 y.o. female  MRN [id number]  Date of Birth: April 30, 1950  Date: 03/17/2020  PCP: Owens Loffler, MD  Referral: Owens Loffler, MD  Chief Complaint  Patient presents with  . Follow-up    ER Visit-Allergic Reaction    This visit occurred during the SARS-CoV-2 public health emergency.  Safety protocols were in place, including screening questions prior to the visit, additional usage of staff PPE, and extensive cleaning of exam room while observing appropriate contact time as indicated for disinfecting solutions.   Subjective:   Melissa Cox is a 69 y.o. very pleasant female patient with Body mass index is 30.47 kg/m. who presents with the following:  F/u ER visit:  The worst it has ever had.  Broke out and started itching.  She thinks that she has a beef allergy.  She cannot think of anything else that she had an ingestion with it could have caused this.  She did eat some shrimp as well that her husband was eating.  She does not know of any particular fish or shellfish allergy.  30 mins went from feet to mid chest.  Husband brought back 3 times.   She initially had rash starting on her feet and this spread up through her chest.  When she went to the ER they did give her some Benadryl and gave her a an injection of steroids.  This did get better after that and by the time she saw the physician.  Started to get a tremor.  No anxiety.   Had happen before with other allergic reaction.  Went to her sewing room - could not make sense of her pattern.    Did eat 1 shrimp and beef.   Review of Systems is noted in the HPI, as appropriate  Objective:   BP 138/82   Pulse 70   Temp (!) 97.4 F (36.3 C) (Temporal)   Ht 5\' 4"  (1.626 m)   Wt 177 lb 8 oz  (80.5 kg)   SpO2 97%   BMI 30.47 kg/m   GEN: No acute distress; alert,appropriate. PULM: Breathing comfortably in no respiratory distress PSYCH: Normally interactive.   Skin: At this point her rash had completely resolved.  Laboratory and Imaging Data:  Assessment and Plan:     ICD-10-CM   1. Allergy, subsequent encounter  T78.40XD Ambulatory referral to Allergy   I some of you been coughing like that for a while acute allergy.  Unclear as to etiology.  She does have an EpiPen now.  We reviewed things to do with allergies.  I am going to place her on some Allegra daily.  Basically, I think that she needs to have allergy testing to figure out exactly what she is allergic to.  No new ingestions.  She thinks this possibly could be related to beef ingestion.  Patient Instructions  For now:  Allegra take 1 tablet every day.  (Generic fexofenadine)   But if your have any inkling of new rash.   Start benadryl asap  Orders Placed This Encounter  Procedures  . Ambulatory referral to Allergy    Follow-up: No follow-ups on file.  Signed,  Maud Deed. Ralonda Tartt, MD   Outpatient Encounter Medications as of 03/17/2020  Medication Sig  . Ascorbic Acid (VITAMIN C  PO) Take 1,000 mg by mouth daily.  . Cholecalciferol (VITAMIN D3 PO) Take 1,000 Units by mouth daily.  . clobetasol cream (TEMOVATE) 4.50 % Apply 1 application topically 2 (two) times daily.  Marland Kitchen EPINEPHrine 0.3 mg/0.3 mL IJ SOAJ injection Inject 0.3 mg into the muscle as needed for anaphylaxis.  . fluticasone (FLONASE) 50 MCG/ACT nasal spray SPRAY 2 SPRAYS INTO EACH NOSTRIL EVERY DAY  . magic mouthwash SOLN SWISH 5 ML BY MOUTH ,HOLD AND EXPECTORATE, REPEAT NO MORE THAN EVERY 4 HOURS  . meclizine (ANTIVERT) 25 MG tablet TAKE ONE TABLET BY MOUTH EVERY 8 HOURS AS NEEDED FOR DIZZINESS  . SYNTHROID 100 MCG tablet TAKE 1 TABLET BY MOUTH DAILY BEFORE BREAKFAST.   No facility-administered encounter medications on file as of  03/17/2020.

## 2020-04-02 ENCOUNTER — Ambulatory Visit: Payer: PPO | Admitting: Endocrinology

## 2020-04-03 ENCOUNTER — Ambulatory Visit (INDEPENDENT_AMBULATORY_CARE_PROVIDER_SITE_OTHER): Payer: PPO | Admitting: Family Medicine

## 2020-04-03 ENCOUNTER — Other Ambulatory Visit: Payer: Self-pay

## 2020-04-03 ENCOUNTER — Encounter: Payer: Self-pay | Admitting: Family Medicine

## 2020-04-03 VITALS — BP 150/78 | HR 86 | Temp 98.4°F | Ht 64.0 in | Wt 169.5 lb

## 2020-04-03 DIAGNOSIS — L509 Urticaria, unspecified: Secondary | ICD-10-CM | POA: Diagnosis not present

## 2020-04-03 DIAGNOSIS — R42 Dizziness and giddiness: Secondary | ICD-10-CM | POA: Diagnosis not present

## 2020-04-03 DIAGNOSIS — R450 Nervousness: Secondary | ICD-10-CM | POA: Insufficient documentation

## 2020-04-03 LAB — CBC WITH DIFFERENTIAL/PLATELET
Basophils Absolute: 0 10*3/uL (ref 0.0–0.1)
Basophils Relative: 0.6 % (ref 0.0–3.0)
Eosinophils Absolute: 0.1 10*3/uL (ref 0.0–0.7)
Eosinophils Relative: 1.6 % (ref 0.0–5.0)
HCT: 42.6 % (ref 36.0–46.0)
Hemoglobin: 14.5 g/dL (ref 12.0–15.0)
Lymphocytes Relative: 35 % (ref 12.0–46.0)
Lymphs Abs: 3 10*3/uL (ref 0.7–4.0)
MCHC: 34.1 g/dL (ref 30.0–36.0)
MCV: 85.2 fl (ref 78.0–100.0)
Monocytes Absolute: 0.7 10*3/uL (ref 0.1–1.0)
Monocytes Relative: 8.7 % (ref 3.0–12.0)
Neutro Abs: 4.6 10*3/uL (ref 1.4–7.7)
Neutrophils Relative %: 54.1 % (ref 43.0–77.0)
Platelets: 264 10*3/uL (ref 150.0–400.0)
RBC: 5 Mil/uL (ref 3.87–5.11)
RDW: 13.9 % (ref 11.5–15.5)
WBC: 8.5 10*3/uL (ref 4.0–10.5)

## 2020-04-03 LAB — COMPREHENSIVE METABOLIC PANEL
ALT: 12 U/L (ref 0–35)
AST: 13 U/L (ref 0–37)
Albumin: 4.5 g/dL (ref 3.5–5.2)
Alkaline Phosphatase: 60 U/L (ref 39–117)
BUN: 17 mg/dL (ref 6–23)
CO2: 29 mEq/L (ref 19–32)
Calcium: 10 mg/dL (ref 8.4–10.5)
Chloride: 104 mEq/L (ref 96–112)
Creatinine, Ser: 0.93 mg/dL (ref 0.40–1.20)
GFR: 62.49 mL/min (ref >60.00)
Glucose, Bld: 87 mg/dL (ref 70–99)
Potassium: 4.4 mEq/L (ref 3.5–5.1)
Sodium: 139 mEq/L (ref 135–145)
Total Bilirubin: 0.5 mg/dL (ref 0.2–1.2)
Total Protein: 7 g/dL (ref 6.0–8.3)

## 2020-04-03 LAB — TSH: TSH: 0.39 u[IU]/mL (ref 0.35–4.50)

## 2020-04-03 LAB — T3, FREE: T3, Free: 3.2 pg/mL (ref 2.3–4.2)

## 2020-04-03 LAB — T4, FREE: Free T4: 1.52 ng/dL (ref 0.60–1.60)

## 2020-04-03 LAB — VITAMIN B12: Vitamin B-12: 250 pg/mL (ref 211–911)

## 2020-04-03 NOTE — Progress Notes (Signed)
Patient ID: Melissa Cox, female    DOB: November 20, 1950, 71 y.o.   MRN: [id number]  This visit was conducted in person.  BP (!) 150/78   Pulse 86   Temp 98.4 F (36.9 C) (Temporal)   Ht 5\' 4"  (1.626 m)   Wt 169 lb 8 oz (76.9 kg)   SpO2 96%   BMI 29.09 kg/m    CC: allergic reaction Subjective:   HPI: Melissa Cox is a 70 y.o. female presenting on 04/03/2020 for Follow-up (Allergic Rx-Seen in ER 03/15/2020.  Scheduled to see Allergist on 04/09/20), Dizziness, Shaking, and Hand & Feet Itchy    On Dec 19 she had an allergic reaction with hives on arms legs and torso.   Treat with steroid injection. Rash resolved. She feels it may be a meat allergy ( had eated a hamburger)..similar happened in past years ago, brother has a confirmed meat allergy.  Followed up with PCP on 12/21.Marland Kitchen tolded to take allegra.. had SE.. felt woozy. Had to reschedule allergist on 1/5.. cancelled given shakiness.  No recurrence of rash but has feet and hands itching. No mouth or tounge swelling, no SOB, no trouble swallowing.  In last 2 days she feels better but still some lightheadedness off and on.  No vertigo but feels off balance for seconds to hours.  Dizzy spells  followoed by shaking spells.   Scheduled again to see allergist 04/09/2020   Hx of hypothyroid... complaint with levothyroxine 100 mg ( lowered dose from 112 mcg to 100 mcg given palpitations in July)  Followed by Dr. Dwyane Dee  Last lab evaluation was in 09/2019     She is drinking lots of water.  Relevant past medical, surgical, family and social history reviewed and updated as indicated. Interim medical history since our last visit reviewed. Allergies and medications reviewed and updated. Outpatient Medications Prior to Visit  Medication Sig Dispense Refill  . Ascorbic Acid (VITAMIN C PO) Take 1,000 mg by mouth daily.    . Cholecalciferol (VITAMIN D3 PO) Take 1,000 Units by mouth daily.    . clobetasol cream (TEMOVATE) 3.71 % Apply 1  application topically 2 (two) times daily. 30 g 1  . diphenhydrAMINE (BENADRYL) 25 mg capsule Take 25 mg by mouth every 6 (six) hours as needed for itching.    Marland Kitchen EPINEPHrine 0.3 mg/0.3 mL IJ SOAJ injection Inject 0.3 mg into the muscle as needed for anaphylaxis. 1 each 0  . meclizine (ANTIVERT) 25 MG tablet TAKE ONE TABLET BY MOUTH EVERY 8 HOURS AS NEEDED FOR DIZZINESS 50 tablet 1  . SYNTHROID 100 MCG tablet TAKE 1 TABLET BY MOUTH DAILY BEFORE BREAKFAST. 90 tablet 1  . magic mouthwash SOLN SWISH 5 ML BY MOUTH ,HOLD AND EXPECTORATE, REPEAT NO MORE THAN EVERY 4 HOURS (Patient not taking: Reported on 04/03/2020)  0  . fluticasone (FLONASE) 50 MCG/ACT nasal spray SPRAY 2 SPRAYS INTO EACH NOSTRIL EVERY DAY 48 mL 1   No facility-administered medications prior to visit.     Per HPI unless specifically indicated in ROS section below Review of Systems  Constitutional: Negative for fatigue and fever.  HENT: Negative for congestion.   Eyes: Negative for pain.  Respiratory: Negative for cough and shortness of breath.   Cardiovascular: Negative for chest pain, palpitations and leg swelling.  Gastrointestinal: Negative for abdominal pain.  Genitourinary: Negative for dysuria and vaginal bleeding.  Musculoskeletal: Negative for back pain.  Neurological: Negative for syncope, light-headedness and headaches.  Psychiatric/Behavioral: Negative  for dysphoric mood.   Objective:  BP (!) 150/78   Pulse 86   Temp 98.4 F (36.9 C) (Temporal)   Ht 5\' 4"  (1.626 m)   Wt 169 lb 8 oz (76.9 kg)   SpO2 96%   BMI 29.09 kg/m   Wt Readings from Last 3 Encounters:  04/03/20 169 lb 8 oz (76.9 kg)  03/17/20 177 lb 8 oz (80.5 kg)  01/29/20 178 lb 8 oz (81 kg)      Physical Exam Constitutional:      General: She is not in acute distress.Vital signs are normal.     Appearance: Normal appearance. She is well-developed and well-nourished. She is not ill-appearing or toxic-appearing.  HENT:     Head: Normocephalic.      Right Ear: Hearing, tympanic membrane, ear canal and external ear normal. Tympanic membrane is not erythematous, retracted or bulging.     Left Ear: Hearing, tympanic membrane, ear canal and external ear normal. Tympanic membrane is not erythematous, retracted or bulging.     Nose: No mucosal edema or rhinorrhea.     Right Sinus: No maxillary sinus tenderness or frontal sinus tenderness.     Left Sinus: No maxillary sinus tenderness or frontal sinus tenderness.     Mouth/Throat:     Mouth: Oropharynx is clear and moist and mucous membranes are normal.     Pharynx: Uvula midline.  Eyes:     General: Lids are normal. Lids are everted, no foreign bodies appreciated.     Extraocular Movements: EOM normal.     Conjunctiva/sclera: Conjunctivae normal.     Pupils: Pupils are equal, round, and reactive to light.  Neck:     Thyroid: No thyroid mass or thyromegaly.     Vascular: No carotid bruit.     Trachea: Trachea normal.  Cardiovascular:     Rate and Rhythm: Normal rate and regular rhythm.     Pulses: Normal pulses and intact distal pulses.     Heart sounds: Normal heart sounds, S1 normal and S2 normal. No murmur heard. No friction rub. No gallop.   Pulmonary:     Effort: Pulmonary effort is normal. No tachypnea or respiratory distress.     Breath sounds: Normal breath sounds. No decreased breath sounds, wheezing, rhonchi or rales.  Abdominal:     General: Bowel sounds are normal.     Palpations: Abdomen is soft.     Tenderness: There is no abdominal tenderness.  Musculoskeletal:     Cervical back: Normal range of motion and neck supple.  Skin:    General: Skin is warm, dry and intact.     Findings: No rash.  Neurological:     Mental Status: She is alert.  Psychiatric:        Mood and Affect: Mood is not anxious or depressed.        Speech: Speech normal.        Behavior: Behavior normal. Behavior is cooperative.        Thought Content: Thought content normal.        Cognition  and Memory: Cognition and memory normal.        Judgment: Judgment normal.       Results for orders placed or performed in visit on 09/23/19  T4, free  Result Value Ref Range   Free T4 0.92 0.60 - 1.60 ng/dL  TSH  Result Value Ref Range   TSH 1.11 0.35 - 4.50 uIU/mL    This visit occurred during  the SARS-CoV-2 public health emergency.  Safety protocols were in place, including screening questions prior to the visit, additional usage of staff PPE, and extensive cleaning of exam room while observing appropriate contact time as indicated for disinfecting solutions.   COVID 19 screen:  No recent travel or known exposure to COVID19 The patient denies respiratory symptoms of COVID 19 at this time. The importance of social distancing was discussed today.   Assessment and Plan  70 year old female with recent spells of hives as well as intermittent spells of dizziness and jitteriness.  Reviewed ER note and Dr. Lillie Fragmin note in detail. Will eval for secondary cause of symptoms with labs to eval for thyroid imbalance with recent change in thyroid meds, possible DM, electrolyte issue, anemia, etc.   Given some possible relation to allergic reaction.. can try Claritin as did not tolerate allegra. Keep appt with allergist for allergy testing ( possible allergy to meat given many past tick bites as well as family history)  No sign of airway compromise.. no indication for steroids.  Pt reassured no S/S of brain tumor or brain bleed.    Eliezer Lofts, MD

## 2020-04-03 NOTE — Patient Instructions (Signed)
Please stop at the lab to have labs drawn.  Increase protein in diet and keep up with fluids.  See allergist as planned.  Try Claritin during the day.

## 2020-04-09 DIAGNOSIS — J3089 Other allergic rhinitis: Secondary | ICD-10-CM | POA: Diagnosis not present

## 2020-04-09 DIAGNOSIS — Z91018 Allergy to other foods: Secondary | ICD-10-CM | POA: Diagnosis not present

## 2020-04-09 DIAGNOSIS — J3081 Allergic rhinitis due to animal (cat) (dog) hair and dander: Secondary | ICD-10-CM | POA: Diagnosis not present

## 2020-04-09 DIAGNOSIS — J301 Allergic rhinitis due to pollen: Secondary | ICD-10-CM | POA: Diagnosis not present

## 2020-04-10 DIAGNOSIS — Z91018 Allergy to other foods: Secondary | ICD-10-CM | POA: Diagnosis not present

## 2020-04-24 ENCOUNTER — Other Ambulatory Visit: Payer: Self-pay | Admitting: Endocrinology

## 2020-04-27 ENCOUNTER — Telehealth: Payer: Self-pay | Admitting: Family Medicine

## 2020-04-27 DIAGNOSIS — R251 Tremor, unspecified: Secondary | ICD-10-CM

## 2020-04-27 DIAGNOSIS — R42 Dizziness and giddiness: Secondary | ICD-10-CM

## 2020-04-27 NOTE — Telephone Encounter (Signed)
Patient states that the allergist had stated that she needs to be seen by a neurologist for the shaking. The patient is requesting a referral to a neuro dr. Please advise. EM

## 2020-04-28 ENCOUNTER — Other Ambulatory Visit: Payer: Self-pay

## 2020-04-28 ENCOUNTER — Other Ambulatory Visit (INDEPENDENT_AMBULATORY_CARE_PROVIDER_SITE_OTHER): Payer: PPO

## 2020-04-28 DIAGNOSIS — E559 Vitamin D deficiency, unspecified: Secondary | ICD-10-CM

## 2020-04-28 DIAGNOSIS — E890 Postprocedural hypothyroidism: Secondary | ICD-10-CM

## 2020-04-28 LAB — VITAMIN D 25 HYDROXY (VIT D DEFICIENCY, FRACTURES): VITD: 29.11 ng/mL — ABNORMAL LOW (ref 30.00–100.00)

## 2020-04-28 LAB — T4, FREE: Free T4: 1.11 ng/dL (ref 0.60–1.60)

## 2020-04-28 LAB — TSH: TSH: 0.18 u[IU]/mL — ABNORMAL LOW (ref 0.35–4.50)

## 2020-04-28 NOTE — Telephone Encounter (Signed)
Allergy notes are reviewed.

## 2020-04-30 ENCOUNTER — Ambulatory Visit: Payer: PPO | Admitting: Endocrinology

## 2020-04-30 ENCOUNTER — Encounter: Payer: Self-pay | Admitting: Endocrinology

## 2020-04-30 ENCOUNTER — Other Ambulatory Visit: Payer: Self-pay

## 2020-04-30 VITALS — BP 128/82 | HR 72 | Ht 64.0 in | Wt 170.6 lb

## 2020-04-30 DIAGNOSIS — E890 Postprocedural hypothyroidism: Secondary | ICD-10-CM | POA: Diagnosis not present

## 2020-04-30 MED ORDER — SYNTHROID 88 MCG PO TABS: 88.0000 ug | ORAL_TABLET | Freq: Every day | ORAL | 1 refills | Status: DC

## 2020-04-30 NOTE — Patient Instructions (Addendum)
Take 2 of Vitamin D3 1000 unit daily

## 2020-04-30 NOTE — Progress Notes (Signed)
Patient ID: Melissa Cox, female   DOB: 10-03-1950, 70 y.o.   MRN: [id number]   Reason for Appointment:  Hypothyroidism, followup visit    History of Present Illness:   The hypothyroidism was first diagnosed in 1977 after treatment of her Graves' disease with [id number]  The patient has been treated with levothyroxine in varying doses, ranging from 100 up to 137 mcg  In 5/16 she had a high TSH level with using generic levothyroxine At that time she had been complaining of some fatigue and with increasing her dose to 112 g she started having a little better energy  She is taking brand name Synthroid  Her dose of 112 mcg had been previously a stable dose; however in 4/21 since her TSH was low normal she was changed to 100 mcg of Synthroid  Her symptoms of palpitations improved previously with her reduced dose of 100 mcg Synthroid  No complaints of fatigue or sluggishness Also recently no significant palpitations; she does get occasionally shaky at night Also may have some anxiety related to her recent allergy problem  No skin or hair changes lately She is losing weight because of modifying her diet for meat allergy  TSH which was normal in 6/21 is now down to 0.18  She is quite regular with taking her thyroid supplement before breakfast           Not taking any calcium or iron supplements in the morning with Synthroid  Lab Results  Component Value Date   TSH 0.18 (L) 04/28/2020   TSH 0.39 04/03/2020   TSH 1.11 09/23/2019   FREET4 1.11 04/28/2020   FREET4 1.52 04/03/2020   FREET4 0.92 09/23/2019    Wt Readings from Last 3 Encounters:  04/30/20 170 lb 9.6 oz (77.4 kg)  04/03/20 169 lb 8 oz (76.9 kg)  03/17/20 177 lb 8 oz (80.5 kg)     Allergies as of 04/30/2020      Reactions   Demerol [meperidine] Rash   Meperidine Hcl    REACTION: rash   Diphenhydramine Hcl    REACTION: abd cramps and diarrhea   Doxycycline Nausea Only   Epinephrine Other (See Comments)    Increased heart rate    Prochlorperazine Edisylate    REACTION: increased heart rate- compazine       Medication List       Accurate as of April 30, 2020  1:28 PM. If you have any questions, ask your nurse or doctor.        clobetasol cream 0.05 % Commonly known as: TEMOVATE Apply 1 application topically 2 (two) times daily.   diphenhydrAMINE 25 mg capsule Commonly known as: BENADRYL Take 25 mg by mouth every 6 (six) hours as needed for itching.   EPINEPHrine 0.3 mg/0.3 mL Soaj injection Commonly known as: EPI-PEN Inject 0.3 mg into the muscle as needed for anaphylaxis.   magic mouthwash Soln   meclizine 25 MG tablet Commonly known as: ANTIVERT TAKE ONE TABLET BY MOUTH EVERY 8 HOURS AS NEEDED FOR DIZZINESS   Synthroid 100 MCG tablet Generic drug: levothyroxine TAKE 1 TABLET BY MOUTH DAILY BEFORE BREAKFAST.   VITAMIN C PO Take 1,000 mg by mouth daily.   VITAMIN D3 PO Take 1,000 Units by mouth daily.       Allergies:  Allergies  Allergen Reactions  . Demerol [Meperidine] Rash  . Meperidine Hcl     REACTION: rash  . Diphenhydramine Hcl     REACTION: abd cramps and  diarrhea  . Doxycycline Nausea Only  . Epinephrine Other (See Comments)    Increased heart rate   . Prochlorperazine Edisylate     REACTION: increased heart rate- compazine     Past Medical History:  Diagnosis Date  . Anemia    years ago - had hysterectomy to stop - hgb 5.2  . Arthritis    hands  . Blood transfusion without reported diagnosis    prior to hysterectomy  . Cataract    beginnings   . Diverticulitis 2018  . GERD (gastroesophageal reflux disease)    occ -uses TUMS  . Lichen sclerosus   . 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 Unspecified essential hypertension 10/21/2008    Past Surgical History:  Procedure Laterality Date  . ABDOMINAL HYSTERECTOMY  3532,9924   partial, fibroids  . BREAST LUMPECTOMY      right,benign  . MENISCUS REPAIR Left     Family History  Problem Relation Age of Onset  . Goiter Mother   . Heart disease Father   . Hypothyroidism Sister   . Stomach cancer Sister   . Colon cancer Maternal Grandmother   . Colon polyps Neg Hx   . Esophageal cancer Neg Hx   . Rectal cancer Neg Hx     Social History:  reports that she has never smoked. She has never used smokeless tobacco. She reports current alcohol use. She reports that she does not use drugs.  REVIEW Of SYSTEMS:   Her baseline vitamin D level was 18 Currently using 1000 units vitamin D3 supplements Since her last visit  she has taken her vitamin D3 along daily  Final vitamin D level is now still relatively low at 29  History of hypertension:  BP Readings from Last 3 Encounters:  04/30/20 128/82  04/03/20 (!) 150/78  03/17/20 138/82     Examination:   BP 128/82   Pulse 72   Ht 5\' 4"  (1.626 m)   Wt 170 lb 9.6 oz (77.4 kg)   SpO2 98%   BMI 29.28 kg/m   No tremor Biceps reflexes appear normal     Assessment   Hypothyroidism, post ablative, long-standing. She has required lower doses of Synthroid more recently, previously has been on as high as 137 mcg Recently again even though she has been on a stable dose of 100 mcg brand-name Synthroid her TSH is below normal  Currently not clear if she is symptomatic, she does have some anxiety and shakiness  Vitamin D deficiency: She is still taking her vitamin D but her level is still under 30     Treatment:  She will stay on brand-name Synthroid but reduce the dose to 88 instead of 100 mcg Vitamin D3 to be increased to 2000 units daily  Follow-up in 3 months    There are no Patient Instructions on file for this visit.   Elayne Snare 04/30/2020, 1:28 PM

## 2020-05-05 NOTE — Addendum Note (Signed)
Addended by: Owens Loffler on: 05/05/2020 06:28 PM   Modules accepted: Orders

## 2020-05-05 NOTE — Telephone Encounter (Signed)
New onset tremors, shaking?, lightheadedness.  Unclear origin, but new tremors are definitely present.

## 2020-05-12 ENCOUNTER — Encounter: Payer: Self-pay | Admitting: Neurology

## 2020-05-12 ENCOUNTER — Ambulatory Visit: Payer: PPO | Admitting: Neurology

## 2020-05-12 ENCOUNTER — Other Ambulatory Visit: Payer: Self-pay

## 2020-05-12 VITALS — Ht 65.0 in | Wt 174.6 lb

## 2020-05-12 DIAGNOSIS — R42 Dizziness and giddiness: Secondary | ICD-10-CM

## 2020-05-12 DIAGNOSIS — E538 Deficiency of other specified B group vitamins: Secondary | ICD-10-CM

## 2020-05-12 NOTE — Progress Notes (Signed)
Reason for visit: Dizziness  Referring physician: Dr. Pearline Cables is a 70 y.o. female  History of present illness:  Ms. Kamen is a 70 year old right-handed white female with a history of sudden onset of itching, skin rash, dizziness, and shaking that began in December 2021.  The patient underwent an evaluation through an allergist and was found to have alpha gal allergy.  [place] and she has had many tick bites over the years.  In retrospect, she remembers over the last 2 years she would have some problems with diarrhea and stomach upset off and on.  She has avoided mammalian meat and animal products, she has had no more episodes of itching and hives.  The patient indicated that beginning with the skin rash, she began having episodes of dizziness that would then be followed by tremors.  The patient has had a longstanding history of fatigue that has gradually worsened over time.  The patient has recently had thyroid studies done showing a low TSH with a normal free T4 level.  Her endocrinologist lowered her Synthroid dose from 100 to 88 mcg daily.  This was done 2 weeks ago.  The patient has not had any episodes of shaking over the last 2 weeks, but she still has episodes of dizziness that are gradually improving over time.  The episodes occur at some point during the day, and may occur with sitting, lying down, or standing.  The episodes are associated with a falling sensation, not spinning, the patient indicates that the episodes may last several minutes to up to 1 hour and then dissipate.  The patient is no longer having episodes of tingling or paresthesias or hives.  She denies any focal weakness or numbness of the face, arms, legs.  She denies any double vision, slurred speech, or difficulty swallowing.  The patient is not operating a motor vehicle because she is afraid that she may get dizzy.  She has not had any falls.  There have been no blackouts.  Her last  MRI of the brain was done in 2019 and showed only very minimal white matter changes, MRA of the head and neck did not show evidence of cerebral aneurysm or significant large or medium vessel blockages.  The patient is sent to this office for further evaluation.  Past Medical History:  Diagnosis Date  . Allergy to alpha-gal   . Anemia    years ago - had hysterectomy to stop - hgb 5.2  . Arthritis    hands  . Blood transfusion without reported diagnosis    prior to hysterectomy  . Cataract    beginnings   . Diverticulitis 2018  . GERD (gastroesophageal reflux disease)    occ -uses TUMS  . Lichen sclerosus   . 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 Unspecified essential hypertension 10/21/2008    Past Surgical History:  Procedure Laterality Date  . ABDOMINAL HYSTERECTOMY  [id number]   partial, fibroids  . BREAST LUMPECTOMY     right,benign  . MENISCUS REPAIR Left     Family History  Problem Relation Age of Onset  . Goiter Mother   . Heart disease Father   . Hypothyroidism Sister   . Stomach cancer Sister   . Colon cancer Maternal Grandmother   . Colon polyps Neg Hx   . Esophageal cancer Neg Hx   . Rectal cancer Neg Hx  Social history:  reports that she has never smoked. She has never used smokeless tobacco. She reports current alcohol use. She reports that she does not use drugs.  Medications:  Prior to Admission medications   Medication Sig Start Date End Date Taking? Authorizing Provider  Ascorbic Acid (VITAMIN C PO) Take 1,000 mg by mouth daily.   Yes [provider]  Cholecalciferol (VITAMIN D3 PO) Take 1,000 Units by mouth daily.   Yes [provider]  clobetasol cream (TEMOVATE) 6.06 % Apply 1 application topically 2 (two) times daily. 01/29/20  Yes Copland, Frederico Hamman, MD  diphenhydrAMINE (BENADRYL) 25 mg capsule Take 25 mg by mouth every 6 (six) hours as needed for itching.   Yes  [provider]  EPINEPHrine 0.3 mg/0.3 mL IJ SOAJ injection Inject 0.3 mg into the muscle as needed for anaphylaxis. 03/15/20  Yes Davonna Belling, MD  magic mouthwash SOLN  08/08/17  Yes [provider]  meclizine (ANTIVERT) 25 MG tablet TAKE ONE TABLET BY MOUTH EVERY 8 HOURS AS NEEDED FOR DIZZINESS 09/04/17  Yes Copland, Frederico Hamman, MD  SYNTHROID 88 MCG tablet Take 1 tablet (88 mcg total) by mouth daily before breakfast. 04/30/20  Yes Elayne Snare, MD      Allergies  Allergen Reactions  . Demerol [Meperidine] Rash  . Meperidine Hcl     REACTION: rash  . Diphenhydramine Hcl     REACTION: abd cramps and diarrhea  . Doxycycline Nausea Only  . Epinephrine Other (See Comments)    Increased heart rate   . Prochlorperazine Edisylate     REACTION: increased heart rate- compazine     ROS:  Out of a complete 14 system review of symptoms, the patient complains only of the following symptoms, and all other reviewed systems are negative.  Dizziness, tremor Skin rash Fatigue Palpitations of the heart Feeling cold, flushing Urinary incontinence Blurred vision  Height 5\' 5"  (1.651 m), weight 174 lb 9.6 oz (79.2 kg).  Physical Exam  General: The patient is alert and cooperative at the time of the examination.  Eyes: Pupils are equal, round, and reactive to light. Discs are flat bilaterally.  Ears: Tympanic membranes are clear bilaterally.  Neck: The neck is supple, no carotid bruits are noted.  Respiratory: The respiratory examination is clear.  Cardiovascular: The cardiovascular examination reveals a regular rate and rhythm, no obvious murmurs or rubs are noted.  Skin: Extremities are without significant edema.  Neurologic Exam  Mental status: The patient is alert and oriented x 3 at the time of the examination. The patient has apparent normal recent and remote memory, with an apparently normal attention span and concentration ability.  Cranial nerves: Facial  symmetry is present. There is good sensation of the face to pinprick and soft touch bilaterally. The strength of the facial muscles and the muscles to head turning and shoulder shrug are normal bilaterally. Speech is well enunciated, no aphasia or dysarthria is noted. Extraocular movements are full. Visual fields are full. The tongue is midline, and the patient has symmetric elevation of the soft palate. No obvious hearing deficits are noted.  Motor: The motor testing reveals 5 over 5 strength of all 4 extremities. Good symmetric motor tone is noted throughout.  Sensory: Sensory testing is intact to pinprick, soft touch, vibration sensation, and position sense on all 4 extremities. No evidence of extinction is noted.  Coordination: Cerebellar testing reveals good finger-nose-finger and heel-to-shin bilaterally. The Nyan-Barrany procedure was done, this elicited no subjective symptoms, no nystagmus  was seen.  Gait and station: Gait is normal. Tandem gait is minimally unsteady. Romberg is negative. No drift is seen.  Reflexes: Deep tendon reflexes are symmetric and normal bilaterally. Toes are downgoing bilaterally.   Assessment/Plan:  1.  Alpha gal syndrome  2.  Episodic dizziness  3.  History of thyroid disease  The patient has had onset of skin rash, dizziness, and episodes of tremor prior to the diagnosis of an alpha gal allergy.  The patient is now avoiding mammalian meat with improvement in her symptoms, she is no longer having episodes of tremors and the dizziness is gradually improving.  Her TSH was also low and she has had a very recent adjustment in her thyroid medication.  Her neurologic examination is normal, I do not suspect a primary neurologic issue.  I would prefer to treat the patient conservatively at this point, I will have her contact our office in 3 to 4 weeks if the symptoms are not clearly improving or are changing or are getting worse.  At that point, I will consider MRI of  the brain.  If she continues to improve, no further evaluation will be necessary through our office.  Jill Alexanders MD 05/12/2020 8:19 AM  [facility] [address] [place], [place] [address]  Phone [phone] Fax [phone]

## 2020-05-15 LAB — COPPER, SERUM: Copper: 117 ug/dL (ref 80–158)

## 2020-05-15 LAB — SEDIMENTATION RATE: Sed Rate: 2 mm/hr (ref 0–40)

## 2020-05-15 LAB — VITAMIN B12: Vitamin B-12: 340 pg/mL (ref 232–1245)

## 2020-05-15 LAB — RPR: RPR Ser Ql: NONREACTIVE

## 2020-06-26 ENCOUNTER — Ambulatory Visit: Payer: PPO | Admitting: Neurology

## 2020-07-05 ENCOUNTER — Other Ambulatory Visit: Payer: Self-pay | Admitting: Family Medicine

## 2020-07-05 NOTE — Telephone Encounter (Signed)
Last office visit 04/03/20 for dizziness, hives & jittery.  Last refilled 01/29/20 for 30 g with 1 refill.  No future appointment with PCP.

## 2020-07-22 ENCOUNTER — Other Ambulatory Visit: Payer: PPO

## 2020-07-24 ENCOUNTER — Ambulatory Visit: Payer: PPO | Admitting: Endocrinology

## 2020-08-10 ENCOUNTER — Other Ambulatory Visit (INDEPENDENT_AMBULATORY_CARE_PROVIDER_SITE_OTHER): Payer: PPO

## 2020-08-10 ENCOUNTER — Other Ambulatory Visit: Payer: Self-pay

## 2020-08-10 DIAGNOSIS — E890 Postprocedural hypothyroidism: Secondary | ICD-10-CM | POA: Diagnosis not present

## 2020-08-10 LAB — T4, FREE: Free T4: 1.17 ng/dL (ref 0.60–1.60)

## 2020-08-10 LAB — TSH: TSH: 0.62 u[IU]/mL (ref 0.35–4.50)

## 2020-08-13 ENCOUNTER — Ambulatory Visit: Payer: PPO | Admitting: Endocrinology

## 2020-08-17 ENCOUNTER — Ambulatory Visit: Payer: PPO | Admitting: Endocrinology

## 2020-08-17 ENCOUNTER — Other Ambulatory Visit: Payer: Self-pay

## 2020-08-17 ENCOUNTER — Encounter: Payer: Self-pay | Admitting: Endocrinology

## 2020-08-17 VITALS — BP 134/82 | HR 89 | Ht 65.0 in | Wt 166.1 lb

## 2020-08-17 DIAGNOSIS — E559 Vitamin D deficiency, unspecified: Secondary | ICD-10-CM | POA: Diagnosis not present

## 2020-08-17 DIAGNOSIS — E890 Postprocedural hypothyroidism: Secondary | ICD-10-CM | POA: Diagnosis not present

## 2020-08-17 NOTE — Progress Notes (Signed)
Patient ID: Melissa Cox, female   DOB: 30-Dec-1950, 70 y.o.   MRN: [id number]   Reason for Appointment:  Hypothyroidism, followup visit    History of Present Illness:   The hypothyroidism was first diagnosed in 1977 after treatment of her Graves' disease with [id number]  The patient has been treated with levothyroxine in varying doses, ranging from 100 up to 137 mcg  In 5/16 she had a high TSH level with using generic levothyroxine At that time she had been complaining of some fatigue and with increasing her dose to 112 g she started having a little better energy  She is taking brand name Synthroid long-term  Her dose of 112 mcg had been previously a stable dose; however in 4/21 since her TSH was low normal she was changed to 100 mcg of Synthroid  Her symptoms of palpitations improved previously with her reduced dose of 100 mcg Synthroid although she still may have occasional symptoms  She feels fairly good and not complaining of unusual tiredness  She is progressively and gradually losing weight because of modifying her diet for meat allergy with more fruits and vegetables  She is quite regular with taking her thyroid supplement on empty stomach before breakfast           Not taking any calcium or iron supplements in the morning with Synthroid  Lab Results  Component Value Date   TSH 0.62 08/10/2020   TSH 0.18 (L) 04/28/2020   TSH 0.39 04/03/2020   FREET4 1.17 08/10/2020   FREET4 1.11 04/28/2020   FREET4 1.52 04/03/2020    Wt Readings from Last 3 Encounters:  08/17/20 166 lb 2 oz (75.4 kg)  05/12/20 174 lb 9.6 oz (79.2 kg)  04/30/20 170 lb 9.6 oz (77.4 kg)     Allergies as of 08/17/2020      Reactions   Demerol [meperidine] Rash   Meperidine Hcl    REACTION: rash   Diphenhydramine Hcl    REACTION: abd cramps and diarrhea   Doxycycline Nausea Only   Epinephrine Other (See Comments)   Increased heart rate    Prochlorperazine Edisylate    REACTION:  increased heart rate- compazine       Medication List       Accurate as of Aug 17, 2020  3:25 PM. If you have any questions, ask your nurse or doctor.        clobetasol cream 0.05 % Commonly known as: TEMOVATE APPLY TO AFFECTED AREA TWICE A DAY   diphenhydrAMINE 25 mg capsule Commonly known as: BENADRYL Take 25 mg by mouth every 6 (six) hours as needed for itching.   EPINEPHrine 0.3 mg/0.3 mL Soaj injection Commonly known as: EPI-PEN Inject 0.3 mg into the muscle as needed for anaphylaxis.   fexofenadine 180 MG tablet Commonly known as: ALLEGRA Take 180 mg by mouth daily.   magic mouthwash Soln   meclizine 25 MG tablet Commonly known as: ANTIVERT TAKE ONE TABLET BY MOUTH EVERY 8 HOURS AS NEEDED FOR DIZZINESS   Synthroid 88 MCG tablet Generic drug: levothyroxine Take 1 tablet (88 mcg total) by mouth daily before breakfast.   VITAMIN C PO Take 1,000 mg by mouth daily.   VITAMIN D3 PO Take 1,000 Units by mouth daily.       Allergies:  Allergies  Allergen Reactions  . Demerol [Meperidine] Rash  . Meperidine Hcl     REACTION: rash  . Diphenhydramine Hcl     REACTION: abd cramps and  diarrhea  . Doxycycline Nausea Only  . Epinephrine Other (See Comments)    Increased heart rate   . Prochlorperazine Edisylate     REACTION: increased heart rate- compazine     Past Medical History:  Diagnosis Date  . Allergy to alpha-gal   . Anemia    years ago - had hysterectomy to stop - hgb 5.2  . Arthritis    hands  . Blood transfusion without reported diagnosis    prior to hysterectomy  . Cataract    beginnings   . Diverticulitis 2018  . GERD (gastroesophageal reflux disease)    occ -uses TUMS  . Lichen sclerosus   . 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 Unspecified essential hypertension 10/21/2008    Past Surgical History:  Procedure Laterality Date  . ABDOMINAL HYSTERECTOMY  [id number]    partial, fibroids  . BREAST LUMPECTOMY     right,benign  . MENISCUS REPAIR Left     Family History  Problem Relation Age of Onset  . Goiter Mother   . Heart disease Father   . Hypothyroidism Sister   . Stomach cancer Sister   . Colon cancer Maternal Grandmother   . Colon polyps Neg Hx   . Esophageal cancer Neg Hx   . Rectal cancer Neg Hx     Social History:  reports that she has never smoked. She has never used smokeless tobacco. She reports current alcohol use. She reports that she does not use drugs.  REVIEW Of SYSTEMS:   Her baseline vitamin D level was 18 She has stopped using vitamin D3 because she does not want to have any contaminants containing animal products  Last vitamin D level was still relatively low at 29  Lab Results  Component Value Date   VD25OH 29.11 (L) 04/28/2020   VD25OH 29.36 (L) 07/18/2019   VD25OH 30.67 05/28/2018     Examination:   BP 134/82   Pulse 89   Ht 5\' 5"  (1.651 m)   Wt 166 lb 2 oz (75.4 kg)   SpO2 98%   BMI 27.64 kg/m   Exam not indicated     Assessment   Hypothyroidism, post ablative, long-standing. She has required lower doses of Synthroid more recently, previously has been on as high as 137 mcg With now going down to 88 mcg of Synthroid her TSH is back to normal  Subjectively doing well  Vitamin D deficiency: Currently not on supplements as discussed above     Treatment:  She will stay on brand-name Synthroid  88  She will do research about vitamin D products do not have any excipients containing animal products and this will also rule out prescription vitamin D capsules    There are no Patient Instructions on file for this visit.   Elayne Snare 08/17/2020, 3:25 PM

## 2020-08-17 NOTE — Patient Instructions (Signed)
Vitamin D2 or D3 daily

## 2020-09-02 DIAGNOSIS — H5203 Hypermetropia, bilateral: Secondary | ICD-10-CM | POA: Diagnosis not present

## 2020-09-02 DIAGNOSIS — H2513 Age-related nuclear cataract, bilateral: Secondary | ICD-10-CM | POA: Diagnosis not present

## 2020-09-02 DIAGNOSIS — H524 Presbyopia: Secondary | ICD-10-CM | POA: Diagnosis not present

## 2020-09-08 DIAGNOSIS — D1801 Hemangioma of skin and subcutaneous tissue: Secondary | ICD-10-CM | POA: Diagnosis not present

## 2020-09-08 DIAGNOSIS — D485 Neoplasm of uncertain behavior of skin: Secondary | ICD-10-CM | POA: Diagnosis not present

## 2020-09-08 DIAGNOSIS — L218 Other seborrheic dermatitis: Secondary | ICD-10-CM | POA: Diagnosis not present

## 2020-09-08 DIAGNOSIS — L570 Actinic keratosis: Secondary | ICD-10-CM | POA: Diagnosis not present

## 2020-09-08 DIAGNOSIS — D1809 Hemangioma of other sites: Secondary | ICD-10-CM | POA: Diagnosis not present

## 2020-09-30 ENCOUNTER — Other Ambulatory Visit: Payer: Self-pay | Admitting: Family Medicine

## 2020-09-30 DIAGNOSIS — Z1231 Encounter for screening mammogram for malignant neoplasm of breast: Secondary | ICD-10-CM

## 2020-11-03 DIAGNOSIS — D4709 Other mast cell neoplasms of uncertain behavior: Secondary | ICD-10-CM | POA: Diagnosis not present

## 2020-11-03 DIAGNOSIS — Z91014 Allergy to mammalian meats: Secondary | ICD-10-CM | POA: Diagnosis not present

## 2020-11-03 DIAGNOSIS — W57XXXS Bitten or stung by nonvenomous insect and other nonvenomous arthropods, sequela: Secondary | ICD-10-CM | POA: Diagnosis not present

## 2020-11-14 ENCOUNTER — Other Ambulatory Visit: Payer: Self-pay | Admitting: Endocrinology

## 2020-11-23 ENCOUNTER — Ambulatory Visit: Payer: PPO

## 2020-12-30 ENCOUNTER — Other Ambulatory Visit: Payer: Self-pay

## 2020-12-30 ENCOUNTER — Ambulatory Visit
Admission: RE | Admit: 2020-12-30 | Discharge: 2020-12-30 | Disposition: A | Discharge status: Home / Self Care | Payer: PPO | Source: Ambulatory Visit | Attending: Family Medicine | Admitting: Family Medicine

## 2020-12-30 DIAGNOSIS — Z1231 Encounter for screening mammogram for malignant neoplasm of breast: Secondary | ICD-10-CM

## 2020-12-31 ENCOUNTER — Ambulatory Visit: Payer: PPO

## 2021-01-05 ENCOUNTER — Telehealth: Payer: Self-pay | Admitting: Family Medicine

## 2021-01-05 NOTE — Telephone Encounter (Signed)
LVM for pt to rtn my call to r/s appt with nha on 01/27/21

## 2021-01-25 NOTE — Progress Notes (Signed)
Subjective:   Melissa Cox is a 70 y.o. female who presents for Medicare Annual (Subsequent) preventive examination.  I connected with Melissa Cox today by telephone and verified that I am speaking with the correct person using two identifiers. Location patient: home Location provider: work Persons participating in the virtual visit: patient, Melissa Cox.    I discussed the limitations, risks, security and privacy concerns of performing an evaluation and management service by telephone and the availability of in person appointments. I also discussed with the patient that there may be a patient responsible charge related to this service. The patient expressed understanding and verbally consented to this telephonic visit.    Interactive audio and video telecommunications were attempted between this provider and patient, however failed, due to patient having technical difficulties OR patient did not have access to video capability.  We continued and completed visit with audio only.  Some vital signs may be absent or patient reported.   Time Spent with patient on telephone encounter: 25 minutes   Review of Systems     Cardiac Risk Factors include: advanced age (>52men, >5 women)     Objective:    Today's Vitals   01/27/21 1115  Weight: 166 lb (75.3 kg)  Height: 5\' 5"  (1.651 m)   Body mass index is 27.62 kg/m.  Advanced Directives 01/27/2021 11/27/2018 11/26/2018 09/13/2018 03/09/2017 11/29/2016  Does Patient Have a Medical Advance Directive? Yes Yes No Yes Yes Yes  Type of Paramedic of [place];Living will [place];Living will - [place];Living will [place];Living will [place];Living will  Does patient want to make changes to medical advance directive? Yes (MAU/Ambulatory/Procedural Areas - Information given) No - Patient declined - No - Patient declined - -  Copy of [place] in Chart? No - copy requested No - copy requested - No - copy requested No - copy requested -    Current Medications (verified) Outpatient Encounter Medications as of 01/27/2021  Medication Sig   Ascorbic Acid (VITAMIN C PO) Take 1,000 mg by mouth daily.   Cholecalciferol (VITAMIN D3 PO) Take 1,000 Units by mouth daily.   clobetasol cream (TEMOVATE) 0.05 % APPLY TO AFFECTED AREA TWICE A DAY   diphenhydrAMINE (BENADRYL) 25 mg capsule Take 25 mg by mouth every 6 (six) hours as needed for itching.   EPINEPHrine 0.3 mg/0.3 mL IJ SOAJ injection Inject 0.3 mg into the muscle as needed for anaphylaxis.   fexofenadine (ALLEGRA) 180 MG tablet Take 180 mg by mouth daily.   magic mouthwash SOLN    meclizine (ANTIVERT) 25 MG tablet TAKE ONE TABLET BY MOUTH EVERY 8 HOURS AS NEEDED FOR DIZZINESS   SYNTHROID 88 MCG tablet TAKE 1 TABLET BY MOUTH DAILY BEFORE BREAKFAST.   No facility-administered encounter medications on file as of 01/27/2021.    Allergies (verified) Demerol [meperidine], Meperidine hcl, Diphenhydramine hcl, Doxycycline, Epinephrine, Prochlorperazine edisylate, and Alpha-gal   History: Past Medical History:  Diagnosis Date   Allergy to alpha-gal    Anemia    years ago - had hysterectomy to stop - hgb 5.2   Arthritis    hands   Blood transfusion without reported diagnosis    prior to hysterectomy   Cataract    beginnings    Diverticulitis 2018   GERD (gastroesophageal reflux disease)    occ -uses TUMS   Lichen sclerosus    Thyrotoxicosis without mention of goiter or other cause,  without mention of thyrotoxic crisis or storm    Unspecified essential hypertension 10/21/2008   Past Surgical History:  Procedure Laterality Date   ABDOMINAL HYSTERECTOMY  2841,3244   partial, fibroids   BREAST LUMPECTOMY     right,benign   MENISCUS REPAIR Left    Family History  Problem Relation Age of Onset   Goiter Mother    Heart disease Father    Hypothyroidism Sister     Stomach cancer Sister    Colon cancer Maternal Grandmother    Colon polyps Neg Hx    Esophageal cancer Neg Hx    Rectal cancer Neg Hx    Social History   Socioeconomic History   Marital status: Married    Spouse name: Melissa Cox   Number of children: Not on file   Years of education: Not on file   Highest education level: Not on file  Occupational History   Not on file  Tobacco Use   Smoking status: Never   Smokeless tobacco: Never  Vaping Use   Vaping Use: Never used  Substance and Sexual Activity   Alcohol use: Not Currently   Drug use: No   Sexual activity: Not Currently  Other Topics Concern   Not on file  Social History Narrative   Lives with husband, Melissa Cox.    Right Handed   Drinks caffeine rarely.     Social Determinants of Health   Financial Resource Strain: Low Risk    Difficulty of Paying Living Expenses: Not hard at all  Food Insecurity: No Food Insecurity   Worried About Charity fundraiser in the Last Year: Never true   [place] in the Last Year: Never true  Transportation Needs: No Transportation Needs   Lack of Transportation (Medical): No   Lack of Transportation (Non-Medical): No  Physical Activity: Inactive   Days of Exercise per Week: 0 days   Minutes of Exercise per Session: 0 min  Stress: No Stress Concern Present   Feeling of Stress : Only a little  Social Connections: Moderately Isolated   Frequency of Communication with Friends and Family: More than three times a week   Frequency of Social Gatherings with Friends and Family: Three times a week   Attends Religious Services: Never   Active Member of Clubs or Organizations: No   Attends Music therapist: Never   Marital Status: Married    Tobacco Counseling Counseling given: Not Answered   Clinical Intake:  Pre-visit preparation completed: Yes  Pain : No/denies pain     BMI - recorded: 27.64 Nutritional Risks: None Diabetes: No  How often do you need to  have someone help you when you read instructions, pamphlets, or other written materials from your doctor or pharmacy?: 1 - Never  Diabetic?No  Interpreter Needed?: No  Information entered by :: Melissa Brigham LPN   Activities of Daily Living In your present state of health, do you have any difficulty performing the following activities: 01/27/2021  Hearing? Y  Comment patient states decrease in hearing  Vision? N  Difficulty concentrating or making decisions? N  Walking or climbing stairs? Y  Comment patient states it is getting a little more difficult  Dressing or bathing? N  Doing errands, shopping? N  Preparing Food and eating ? N  Using the Toilet? N  In the past six months, have you accidently leaked urine? Y  Comment a little leakage  Do you have problems with loss of bowel control? N  Managing your Medications? N  Managing your Finances? N  Housekeeping or managing your Housekeeping? N  Some recent data might be hidden    Patient Care Team: Owens Loffler, MD as PCP - General (Family Medicine)  Indicate any recent Medical Services you may have received from other than [facility] providers in the past year (date may be approximate).     Assessment:   This is a routine wellness examination for Melissa Cox.  Hearing/Vision screen Hearing Screening - Comments:: Noticed a decrease in hearing, will speak with PCP Vision Screening - Comments:: Last eye exam 08/2020, [facility] Opthalmology Dr. Prudencio Burly, wears reading glasses  Dietary issues and exercise activities discussed: Current Exercise Habits: The patient does not participate in regular exercise at present   Goals Addressed             This Visit's Progress    Patient Stated       Maintain drinking water and working in the garden.       Depression Screen PHQ 2/9 Scores 01/27/2021 09/13/2018 03/09/2017 10/05/2016  PHQ - 2 Score 0 0 0 0  PHQ- 9 Score - 0 0 -    Fall Risk Fall Risk  01/27/2021 09/13/2018 03/09/2017  10/05/2016  Falls in the past year? 0 0 Yes Yes  Number falls in past yr: 0 - 1 1  Injury with Fall? 0 - Yes Yes  Risk for fall due to : No Fall Risks - - -  Follow up Falls prevention discussed - - -    FALL RISK PREVENTION PERTAINING TO THE HOME:  Any stairs in or around the home? Yes  If so, are there any without handrails? No  Home free of loose throw rugs in walkways, pet beds, electrical cords, etc? Yes  Adequate lighting in your home to reduce risk of falls? Yes   ASSISTIVE DEVICES UTILIZED TO PREVENT FALLS:  Life alert? No  Use of a cane, walker or w/c? No  Grab bars in the bathroom? Yes  Shower chair or bench in shower? Yes  Elevated toilet seat or a handicapped toilet? Yes   TIMED UP AND GO:  Was the test performed? No , visit completed over the phone.  Cognitive Function: Normal cognitive status assessed by this Nurse Health Advisor. No abnormalities found.   MMSE - Mini Mental State Exam 09/13/2018 03/09/2017  Orientation to time 5 5  Orientation to Place 5 5  Registration 3 3  Attention/ Calculation 0 0  Recall 3 3  Language- name 2 objects 0 0  Language- repeat 1 1  Language- follow 3 step command 0 3  Language- read & follow direction 0 0  Write a sentence 0 0  Copy design 0 0  Total score 17 20        Immunizations Immunization History  Administered Date(s) Administered   Fluad Quad(high Dose 65+) 01/15/2019, 01/22/2020   Hepatitis A 10/30/2006   Hepatitis B 05/25/2006, 10/30/2006, 10/21/2008   Influenza Split 01/26/2012   Influenza, High Dose Seasonal PF 01/28/2016, 01/17/2017, 02/05/2018   Influenza,inj,Quad PF,6+ Mos 02/07/2014, 03/19/2015   PFIZER(Purple Top)SARS-COV-2 Vaccination 05/10/2019, 06/02/2019, 03/03/2020   Pneumococcal Conjugate-13 06/09/2016   Pneumococcal Polysaccharide-23 06/07/2018   Td 02/21/2006   Tdap 03/03/2016    TDAP status: Up to date  Flu Vaccine status: Due, Education has been provided regarding the importance  of this vaccine. Advised may receive this vaccine at local pharmacy or Health Dept. Aware to provide a copy of the vaccination record if obtained  from local pharmacy or Health Dept. Verbalized acceptance and understanding.  Pneumococcal vaccine status: Up to date  Covid-19 vaccine status: Declined, Education has been provided regarding the importance of this vaccine but patient still declined. Advised may receive this vaccine at local pharmacy or Health Dept.or vaccine clinic. Aware to provide a copy of the vaccination record if obtained from local pharmacy or Health Dept. Verbalized acceptance and understanding.  Qualifies for Shingles Vaccine? Yes   Zostavax completed Yes   Shingrix Completed?: No.    Education has been provided regarding the importance of this vaccine. Patient has been advised to call insurance company to determine out of pocket expense if they have not yet received this vaccine. Advised may also receive vaccine at local pharmacy or Health Dept. Verbalized acceptance and understanding.  Screening Tests Health Maintenance  Topic Date Due   Zoster Vaccines- Shingrix (1 of 2) Never done   Fecal DNA (Cologuard)  05/13/2019   COVID-19 Vaccine (4 - Booster for Pfizer series) 04/28/2020   INFLUENZA VACCINE  10/26/2020   MAMMOGRAM  12/31/2022   TETANUS/TDAP  03/03/2026   Pneumonia Vaccine 100+ Years old  Completed   DEXA SCAN  Completed   Hepatitis C Screening  Completed   HPV VACCINES  Aged Out    Health Maintenance  Health Maintenance Due  Topic Date Due   Zoster Vaccines- Shingrix (1 of 2) Never done   Fecal DNA (Cologuard)  05/13/2019   COVID-19 Vaccine (4 - Booster for [place] series) 04/28/2020   INFLUENZA VACCINE  10/26/2020    Colorectal cancer screening: Due, last cologuard on 05/12/16, ordered  today  Mammogram status: Completed 12/30/20. Repeat every year  Bone Density status: Ordered 01/27/21. Pt provided with contact info and advised to call to schedule  appt.  Lung Cancer Screening: (Low Dose CT Chest recommended if Age 31-80 years, 30 pack-year currently smoking OR have quit w/in 15years.) does not qualify.     Additional Screening:  Hepatitis C Screening: does qualify; Completed 03/09/17  Vision Screening: Recommended annual ophthalmology exams for early detection of glaucoma and other disorders of the eye. Is the patient up to date with their annual eye exam?  Yes  Who is the provider or what is the name of the office in which the patient attends annual eye exams? Dr. Prudencio Burly   Dental Screening: Recommended annual dental exams for proper oral hygiene  Community Resource Referral / Chronic Care Management: CRR required this visit?  No   CCM required this visit?  No      Plan:     I have personally reviewed and noted the following in the patient's chart:   Medical and social history Use of alcohol, tobacco or illicit drugs  Current medications and supplements including opioid prescriptions.  Functional ability and status Nutritional status Physical activity Advanced directives List of other physicians Hospitalizations, surgeries, and ER visits in previous 12 months Vitals Screenings to include cognitive, depression, and falls Referrals and appointments  In addition, I have reviewed and discussed with patient certain preventive protocols, quality metrics, and best practice recommendations. A written personalized care plan for preventive services as well as general preventive health recommendations were provided to patient.   Due to this being a telephonic visit, the after visit summary with patients personalized plan was offered to patient via mail or my-chart. Patient would like to access on my-chart  Loma Messing, LPN   [id number]   Nurse Health Advisor  Nurse Notes: None

## 2021-01-27 ENCOUNTER — Ambulatory Visit (INDEPENDENT_AMBULATORY_CARE_PROVIDER_SITE_OTHER): Payer: PPO

## 2021-01-27 VITALS — Ht 65.0 in | Wt 166.0 lb

## 2021-01-27 DIAGNOSIS — Z0000 Encounter for general adult medical examination without abnormal findings: Secondary | ICD-10-CM | POA: Diagnosis not present

## 2021-01-27 DIAGNOSIS — Z1211 Encounter for screening for malignant neoplasm of colon: Secondary | ICD-10-CM

## 2021-01-27 DIAGNOSIS — Z780 Asymptomatic menopausal state: Secondary | ICD-10-CM | POA: Diagnosis not present

## 2021-01-27 NOTE — Patient Instructions (Signed)
Melissa Cox , Thank you for taking time to complete your Medicare Wellness Visit. I appreciate your ongoing commitment to your health goals. Please review the following plan we discussed and let me know if I can assist you in the future.   Screening recommendations/referrals: Colonoscopy: cologuard last completed 05/12/16, due 05/13/19, Ordered today Mammogram: up to date, completed 12/30/20, Due 12/30/21 Bone Density: Due 02/07/21, ordered today, someone will call to schedule Recommended yearly ophthalmology/optometry visit for glaucoma screening and checkup Recommended yearly dental visit for hygiene and checkup  Vaccinations: Influenza vaccine: Due-May obtain vaccine at our office or your local pharmacy. Pneumococcal vaccine: up to date  Tdap vaccine: up to date, completed 03/03/16, due 03/03/26 Shingles vaccine: May obtain vaccine from your local pharmacy   Covid-19:newest booster available at your local pharmacy if you change your mind  Advanced directives: Please bring a copy of Living Will and/or [place] for your chart.   Conditions/risks identified: see problem list  Next appointment: Follow up in one year for your annual wellness visit 02/01/22 @ 9:45am, this is a telephone visit per your request   Preventive Care 13 Years and Older, Female Preventive care refers to lifestyle choices and visits with your health care provider that can promote health and wellness. What does preventive care include? A yearly physical exam. This is also called an annual well check. Dental exams once or twice a year. Routine eye exams. Ask your health care provider how often you should have your eyes checked. Personal lifestyle choices, including: Daily care of your teeth and gums. Regular physical activity. Eating a healthy diet. Avoiding tobacco and drug use. Limiting alcohol use. Practicing safe sex. Taking low-dose aspirin every day. Taking vitamin and mineral supplements as  recommended by your health care provider. What happens during an annual well check? The services and screenings done by your health care provider during your annual well check will depend on your age, overall health, lifestyle risk factors, and family history of disease. Counseling  Your health care provider may ask you questions about your: Alcohol use. Tobacco use. Drug use. Emotional well-being. Home and relationship well-being. Sexual activity. Eating habits. History of falls. Memory and ability to understand (cognition). Work and work Statistician. Reproductive health. Screening  You may have the following tests or measurements: Height, weight, and BMI. Blood pressure. Lipid and cholesterol levels. These may be checked every 5 years, or more frequently if you are over 62 years old. Skin check. Lung cancer screening. You may have this screening every year starting at age 47 if you have a 30-pack-year history of smoking and currently smoke or have quit within the past 15 years. Fecal occult blood test (FOBT) of the stool. You may have this test every year starting at age 60. Flexible sigmoidoscopy or colonoscopy. You may have a sigmoidoscopy every 5 years or a colonoscopy every 10 years starting at age 66. Hepatitis C blood test. Hepatitis B blood test. Sexually transmitted disease (STD) testing. Diabetes screening. This is done by checking your blood sugar (glucose) after you have not eaten for a while (fasting). You may have this done every 1-3 years. Bone density scan. This is done to screen for osteoporosis. You may have this done starting at age 23. Mammogram. This may be done every 1-2 years. Talk to your health care provider about how often you should have regular mammograms. Talk with your health care provider about your test results, treatment options, and if necessary, the need for  more tests. Vaccines  Your health care provider may recommend certain vaccines, such  as: Influenza vaccine. This is recommended every year. Tetanus, diphtheria, and acellular pertussis (Tdap, Td) vaccine. You may need a Td booster every 10 years. Zoster vaccine. You may need this after age 39. Pneumococcal 13-valent conjugate (PCV13) vaccine. One dose is recommended after age 80. Pneumococcal polysaccharide (PPSV23) vaccine. One dose is recommended after age 54. Talk to your health care provider about which screenings and vaccines you need and how often you need them. This information is not intended to replace advice given to you by your health care provider. Make sure you discuss any questions you have with your health care provider. Document Released: 04/10/2015 Document Revised: 12/02/2015 Document Reviewed: 01/13/2015 Elsevier Interactive Patient Education  2017 [place] Prevention in the Home Falls can cause injuries. They can happen to people of all ages. There are many things you can do to make your home safe and to help prevent falls. What can I do on the outside of my home? Regularly fix the edges of walkways and driveways and fix any cracks. Remove anything that might make you trip as you walk through a door, such as a raised step or threshold. Trim any bushes or trees on the path to your home. Use bright outdoor lighting. Clear any walking paths of anything that might make someone trip, such as rocks or tools. Regularly check to see if handrails are loose or broken. Make sure that both sides of any steps have handrails. Any raised decks and porches should have guardrails on the edges. Have any leaves, snow, or ice cleared regularly. Use sand or salt on walking paths during winter. Clean up any spills in your garage right away. This includes oil or grease spills. What can I do in the bathroom? Use night lights. Install grab bars by the toilet and in the tub and shower. Do not use towel bars as grab bars. Use non-skid mats or decals in the tub or  shower. If you need to sit down in the shower, use a plastic, non-slip stool. Keep the floor dry. Clean up any water that spills on the floor as soon as it happens. Remove soap buildup in the tub or shower regularly. Attach bath mats securely with double-sided non-slip rug tape. Do not have throw rugs and other things on the floor that can make you trip. What can I do in the bedroom? Use night lights. Make sure that you have a light by your bed that is easy to reach. Do not use any sheets or blankets that are too big for your bed. They should not hang down onto the floor. Have a firm chair that has side arms. You can use this for support while you get dressed. Do not have throw rugs and other things on the floor that can make you trip. What can I do in the kitchen? Clean up any spills right away. Avoid walking on wet floors. Keep items that you use a lot in easy-to-reach places. If you need to reach something above you, use a strong step stool that has a grab bar. Keep electrical cords out of the way. Do not use floor polish or wax that makes floors slippery. If you must use wax, use non-skid floor wax. Do not have throw rugs and other things on the floor that can make you trip. What can I do with my stairs? Do not leave any items on the stairs. Make  sure that there are handrails on both sides of the stairs and use them. Fix handrails that are broken or loose. Make sure that handrails are as long as the stairways. Check any carpeting to make sure that it is firmly attached to the stairs. Fix any carpet that is loose or worn. Avoid having throw rugs at the top or bottom of the stairs. If you do have throw rugs, attach them to the floor with carpet tape. Make sure that you have a light switch at the top of the stairs and the bottom of the stairs. If you do not have them, ask someone to add them for you. What else can I do to help prevent falls? Wear shoes that: Do not have high heels. Have  rubber bottoms. Are comfortable and fit you well. Are closed at the toe. Do not wear sandals. If you use a stepladder: Make sure that it is fully opened. Do not climb a closed stepladder. Make sure that both sides of the stepladder are locked into place. Ask someone to hold it for you, if possible. Clearly mark and make sure that you can see: Any grab bars or handrails. First and last steps. Where the edge of each step is. Use tools that help you move around (mobility aids) if they are needed. These include: Canes. Walkers. Scooters. Crutches. Turn on the lights when you go into a dark area. Replace any light bulbs as soon as they burn out. Set up your furniture so you have a clear path. Avoid moving your furniture around. If any of your floors are uneven, fix them. If there are any pets around you, be aware of where they are. Review your medicines with your doctor. Some medicines can make you feel dizzy. This can increase your chance of falling. Ask your doctor what other things that you can do to help prevent falls. This information is not intended to replace advice given to you by your health care provider. Make sure you discuss any questions you have with your health care provider. Document Released: 01/08/2009 Document Revised: 08/20/2015 Document Reviewed: 04/18/2014 Elsevier Interactive Patient Education  2017 [employer].

## 2021-02-08 DIAGNOSIS — Z1211 Encounter for screening for malignant neoplasm of colon: Secondary | ICD-10-CM | POA: Diagnosis not present

## 2021-02-16 LAB — COLOGUARD: COLOGUARD: NEGATIVE

## 2021-02-22 ENCOUNTER — Other Ambulatory Visit (INDEPENDENT_AMBULATORY_CARE_PROVIDER_SITE_OTHER): Payer: PPO

## 2021-02-22 ENCOUNTER — Other Ambulatory Visit: Payer: Self-pay

## 2021-02-22 DIAGNOSIS — E559 Vitamin D deficiency, unspecified: Secondary | ICD-10-CM | POA: Diagnosis not present

## 2021-02-22 DIAGNOSIS — E890 Postprocedural hypothyroidism: Secondary | ICD-10-CM

## 2021-02-22 LAB — TSH: TSH: 3.04 u[IU]/mL (ref 0.35–5.50)

## 2021-02-22 LAB — VITAMIN D 25 HYDROXY (VIT D DEFICIENCY, FRACTURES): VITD: 36.1 ng/mL (ref 30.00–100.00)

## 2021-02-22 LAB — T4, FREE: Free T4: 0.88 ng/dL (ref 0.60–1.60)

## 2021-02-25 ENCOUNTER — Ambulatory Visit: Payer: PPO | Admitting: Endocrinology

## 2021-02-26 ENCOUNTER — Ambulatory Visit: Payer: PPO | Admitting: Endocrinology

## 2021-02-26 ENCOUNTER — Other Ambulatory Visit: Payer: Self-pay

## 2021-02-26 ENCOUNTER — Encounter: Payer: Self-pay | Admitting: Endocrinology

## 2021-02-26 VITALS — BP 138/82 | HR 71 | Ht 64.0 in | Wt 163.0 lb

## 2021-02-26 DIAGNOSIS — E890 Postprocedural hypothyroidism: Secondary | ICD-10-CM | POA: Diagnosis not present

## 2021-02-26 DIAGNOSIS — E559 Vitamin D deficiency, unspecified: Secondary | ICD-10-CM

## 2021-02-26 NOTE — Progress Notes (Signed)
Patient ID: Melissa Cox, female   DOB: Feb 16, 1951, 70 y.o.   MRN: [id number]   Reason for Appointment:  Hypothyroidism, followup visit    History of Present Illness:   The hypothyroidism was first diagnosed in 1977 after treatment of her Graves' disease with I-131  The patient has been treated with levothyroxine in varying doses, ranging from 100 up to 137 mcg  In 5/16 she had a high TSH level with using generic levothyroxine At that time she had been complaining of some fatigue and with increasing her dose to 112 g she started having a little better energy  She is taking brand name Synthroid long-term  Her dose of 112 mcg had been previously a stable dose; however in 4/21 since her TSH was low normal she was changed to 100 mcg of Synthroid  Her symptoms of palpitations improved previously with her reduced dose of 100 mcg Synthroid   She has not been complaining of any unusual fatigue Previously had lost weight and this appears to have leveled off  Currently taking 88 mcg of Synthroid She is quite regular with taking her brand-name Synthroid supplement However in the last couple of weeks she has changed her regimen and is taking this at bedtime instead of in the morning This is because she read that she should not take Synthroid with her coffee in the morning           Not taking any calcium or iron supplements in the morning with Synthroid  Thyroid levels as follows:  Lab Results  Component Value Date   TSH 3.04 02/22/2021   TSH 0.62 08/10/2020   TSH 0.18 (L) 04/28/2020   FREET4 0.88 02/22/2021   FREET4 1.17 08/10/2020   FREET4 1.11 04/28/2020    Wt Readings from Last 3 Encounters:  02/26/21 163 lb (73.9 kg)  01/27/21 166 lb (75.3 kg)  08/17/20 166 lb 2 oz (75.4 kg)     Allergies as of 02/26/2021       Reactions   Demerol [meperidine] Rash   Meperidine Hcl    REACTION: rash   Diphenhydramine Hcl    REACTION: abd cramps and diarrhea    Doxycycline Nausea Only   Epinephrine Other (See Comments)   Increased heart rate    Prochlorperazine Edisylate    REACTION: increased heart rate- compazine    Alpha-gal Diarrhea, Nausea And Vomiting   Patient states throat tingling reaction, lump in throat and shaking.        Medication List        Accurate as of February 26, 2021 11:14 AM. If you have any questions, ask your nurse or doctor.          clobetasol cream 0.05 % Commonly known as: TEMOVATE APPLY TO AFFECTED AREA TWICE A DAY   diphenhydrAMINE 25 mg capsule Commonly known as: BENADRYL Take 25 mg by mouth every 6 (six) hours as needed for itching.   EPINEPHrine 0.3 mg/0.3 mL Soaj injection Commonly known as: EPI-PEN Inject 0.3 mg into the muscle as needed for anaphylaxis.   fexofenadine 180 MG tablet Commonly known as: ALLEGRA Take 180 mg by mouth daily.   magic mouthwash Soln   meclizine 25 MG tablet Commonly known as: ANTIVERT TAKE ONE TABLET BY MOUTH EVERY 8 HOURS AS NEEDED FOR DIZZINESS   Synthroid 88 MCG tablet Generic drug: levothyroxine TAKE 1 TABLET BY MOUTH DAILY BEFORE BREAKFAST.   VITAMIN C PO Take 1,000 mg by mouth daily.   VITAMIN  D3 PO Take 1,000 Units by mouth daily.        Allergies:  Allergies  Allergen Reactions   Demerol [Meperidine] Rash   Meperidine Hcl     REACTION: rash   Diphenhydramine Hcl     REACTION: abd cramps and diarrhea   Doxycycline Nausea Only   Epinephrine Other (See Comments)    Increased heart rate    Prochlorperazine Edisylate     REACTION: increased heart rate- compazine    Alpha-Gal Diarrhea and Nausea And Vomiting    Patient states throat tingling reaction, lump in throat and shaking.    Past Medical History:  Diagnosis Date   Allergy to alpha-gal    Anemia    years ago - had hysterectomy to stop - hgb 5.2   Arthritis    hands   Blood transfusion without reported diagnosis    prior to hysterectomy   Cataract    beginnings     Diverticulitis 2018   GERD (gastroesophageal reflux disease)    occ -uses TUMS   Lichen sclerosus    Thyrotoxicosis without mention of goiter or other cause, without mention of thyrotoxic crisis or storm    Unspecified essential hypertension 10/21/2008    Past Surgical History:  Procedure Laterality Date   ABDOMINAL HYSTERECTOMY  3419,3790   partial, fibroids   BREAST LUMPECTOMY     right,benign   MENISCUS REPAIR Left     Family History  Problem Relation Age of Onset   Goiter Mother    Heart disease Father    Hypothyroidism Sister    Stomach cancer Sister    Colon cancer Maternal Grandmother    Colon polyps Neg Hx    Esophageal cancer Neg Hx    Rectal cancer Neg Hx     Social History:  reports that she has never smoked. She has never used smokeless tobacco. She reports that she does not currently use alcohol. She reports that she does not use drugs.  REVIEW Of SYSTEMS:   Her baseline vitamin D level was 18  She has finally started taking a vitamin D supplement although not clear if she is taking 1000 or 2000 units She says she is getting a vegan formulation from [employer]  Component Value Date   VD25OH 36.10 02/22/2021   VD25OH 29.11 (L) 04/28/2020   VD25OH 29.36 (L) 07/18/2019     Examination:   BP 138/82   Pulse 71   Ht 5\' 4"  (1.626 m)   Wt 163 lb (73.9 kg)   SpO2 99%   BMI 27.98 kg/m        Assessment   Hypothyroidism, post ablative, long-standing.  She has required lower doses of Synthroid more recently, previously has been on as high as 137 mcg She has been continued on 88 mcg of Synthroid l  Subjectively doing well TSH is normal although trending slightly higher than before  Vitamin D deficiency: Now adequately replaced with OTC supplements     Treatment:  She will stay on brand-name Synthroid 88 mcg However discussed that she should not take this at bedtime but take it in the morning when she wakes up and he can have her  coffee and breakfast 15 to 30 minutes later Given her information from the Synthroid delivers program and she will let us know if she wants to enroll  Continue same vitamin D supplements   There are no Patient Instructions on file for this visit.   Elayne Snare 02/26/2021, 11:14 AM

## 2021-02-26 NOTE — Patient Instructions (Signed)
Take thyroid in am early

## 2021-03-17 ENCOUNTER — Telehealth: Payer: Self-pay | Admitting: Family Medicine

## 2021-03-17 MED ORDER — EPINEPHRINE 0.3 MG/0.3ML IJ SOAJ: 0.3000 mg | INTRAMUSCULAR | 0 refills | Status: DC | PRN

## 2021-03-17 NOTE — Telephone Encounter (Signed)
Melissa Cox called in and wanted to know if Dr. Lorelei Pont could write a script for an EpiPen . She had one a yr ago when she went to the ER and they wrote it and they told her that the Epi-Pen needs to be replaces every year.   The pharmacy is to [place]

## 2021-03-17 NOTE — Telephone Encounter (Signed)
Refill sent as requested. 

## 2021-05-05 DIAGNOSIS — L570 Actinic keratosis: Secondary | ICD-10-CM | POA: Diagnosis not present

## 2021-05-05 DIAGNOSIS — L720 Epidermal cyst: Secondary | ICD-10-CM | POA: Diagnosis not present

## 2021-05-05 DIAGNOSIS — D225 Melanocytic nevi of trunk: Secondary | ICD-10-CM | POA: Diagnosis not present

## 2021-05-05 DIAGNOSIS — D1801 Hemangioma of skin and subcutaneous tissue: Secondary | ICD-10-CM | POA: Diagnosis not present

## 2021-05-05 DIAGNOSIS — L821 Other seborrheic keratosis: Secondary | ICD-10-CM | POA: Diagnosis not present

## 2021-05-22 ENCOUNTER — Other Ambulatory Visit: Payer: Self-pay | Admitting: Endocrinology

## 2021-07-08 ENCOUNTER — Ambulatory Visit
Admission: RE | Admit: 2021-07-08 | Discharge: 2021-07-08 | Disposition: A | Discharge status: Home / Self Care | Payer: PPO | Source: Ambulatory Visit | Attending: Family Medicine | Admitting: Family Medicine

## 2021-07-08 DIAGNOSIS — M85851 Other specified disorders of bone density and structure, right thigh: Secondary | ICD-10-CM | POA: Diagnosis not present

## 2021-07-08 DIAGNOSIS — Z780 Asymptomatic menopausal state: Secondary | ICD-10-CM | POA: Diagnosis not present

## 2021-08-24 ENCOUNTER — Telehealth: Payer: Self-pay

## 2021-08-24 NOTE — Telephone Encounter (Signed)
Pt called saying her husband needs a handicap placard and the DMV told them it has to be in the name of the owner of the car and not the patient. I looked it up and advised her on the back of the application it states that the placrd can be moved to other vehicles as long as the handicapped person is in the vehicle. I advised she can buy 2 placards. 1 for each car. She will go back to the [facility].

## 2021-09-27 DIAGNOSIS — H903 Sensorineural hearing loss, bilateral: Secondary | ICD-10-CM | POA: Diagnosis not present

## 2022-01-28 DIAGNOSIS — G8929 Other chronic pain: Secondary | ICD-10-CM | POA: Diagnosis not present

## 2022-01-28 DIAGNOSIS — M1990 Unspecified osteoarthritis, unspecified site: Secondary | ICD-10-CM | POA: Diagnosis not present

## 2022-01-28 DIAGNOSIS — I10 Essential (primary) hypertension: Secondary | ICD-10-CM | POA: Diagnosis not present

## 2022-01-28 DIAGNOSIS — J309 Allergic rhinitis, unspecified: Secondary | ICD-10-CM | POA: Diagnosis not present

## 2022-01-28 DIAGNOSIS — H259 Unspecified age-related cataract: Secondary | ICD-10-CM | POA: Diagnosis not present

## 2022-01-28 DIAGNOSIS — E039 Hypothyroidism, unspecified: Secondary | ICD-10-CM | POA: Diagnosis not present

## 2022-01-28 DIAGNOSIS — G4700 Insomnia, unspecified: Secondary | ICD-10-CM | POA: Diagnosis not present

## 2022-01-28 DIAGNOSIS — K219 Gastro-esophageal reflux disease without esophagitis: Secondary | ICD-10-CM | POA: Diagnosis not present

## 2022-01-28 DIAGNOSIS — E785 Hyperlipidemia, unspecified: Secondary | ICD-10-CM | POA: Diagnosis not present

## 2022-01-28 DIAGNOSIS — E663 Overweight: Secondary | ICD-10-CM | POA: Diagnosis not present

## 2022-01-28 DIAGNOSIS — E559 Vitamin D deficiency, unspecified: Secondary | ICD-10-CM | POA: Diagnosis not present

## 2022-01-28 DIAGNOSIS — L309 Dermatitis, unspecified: Secondary | ICD-10-CM | POA: Diagnosis not present

## 2022-02-01 ENCOUNTER — Ambulatory Visit (INDEPENDENT_AMBULATORY_CARE_PROVIDER_SITE_OTHER): Payer: PPO

## 2022-02-01 VITALS — Wt 163.0 lb

## 2022-02-01 DIAGNOSIS — Z0000 Encounter for general adult medical examination without abnormal findings: Secondary | ICD-10-CM | POA: Diagnosis not present

## 2022-02-01 NOTE — Progress Notes (Signed)
Virtual Visit via Telephone Note  I connected with  Melissa Cox on 02/01/22 at  9:45 AM EST by telephone and verified that I am speaking with the correct person using two identifiers.  Location: Patient: home Provider: [place] Persons participating in the virtual visit: [place]   I discussed the limitations, risks, security and privacy concerns of performing an evaluation and management service by telephone and the availability of in person appointments. The patient expressed understanding and agreed to proceed.  Interactive audio and video telecommunications were attempted between this nurse and patient, however failed, due to patient having technical difficulties OR patient did not have access to video capability.  We continued and completed visit with audio only.  Some vital signs may be absent or patient reported.   Dionisio David, LPN  Subjective:   Melissa Cox is a 71 y.o. female who presents for Medicare Annual (Subsequent) preventive examination.  Review of Systems     Cardiac Risk Factors include: advanced age (>45mn, >>27women)     Objective:    There were no vitals filed for this visit. There is no height or weight on file to calculate BMI.     02/01/2022    9:59 AM 01/27/2021   11:20 AM 11/27/2018    4:42 AM 11/26/2018    3:26 PM 09/13/2018   12:55 PM 03/09/2017    3:06 PM 11/29/2016    2:24 PM  Advanced Directives  Does Patient Have a Medical Advance Directive? No Yes Yes No Yes Yes Yes  Type of ASocial research officer, governmentLiving will HHampshireLiving will  HWilliamsLiving will HWorthamLiving will HMescalLiving will  Does patient want to make changes to medical advance directive?  Yes (MAU/Ambulatory/Procedural Areas - Information given) No - Patient declined  No - Patient declined    Copy of [place]  Chart?  No - copy requested No - copy requested  No - copy requested No - copy requested   Would patient like information on creating a medical advance directive? No - Patient declined          Current Medications (verified) Outpatient Encounter Medications as of 02/01/2022  Medication Sig   Ascorbic Acid (VITAMIN C PO) Take 1,000 mg by mouth daily.   Cholecalciferol (VITAMIN D3 PO) Take 1,000 Units by mouth daily.   clobetasol cream (TEMOVATE) 0.05 % APPLY TO AFFECTED AREA TWICE A DAY   Clobetasol Propionate 0.025 % CREA Apply topically.   diphenhydrAMINE (BENADRYL) 12.5 MG/5ML liquid Take by mouth.   diphenhydrAMINE (BENADRYL) 25 mg capsule Take 25 mg by mouth every 6 (six) hours as needed for itching.   diphenhydrAMINE (BENADRYL) 50 MG capsule Take by mouth.   EPINEPHrine 0.3 mg/0.3 mL IJ SOAJ injection Inject 0.3 mg into the muscle as needed for anaphylaxis.   fexofenadine (ALLEGRA) 180 MG tablet Take 180 mg by mouth daily.   fluocinonide cream (LIDEX) 0.05 % Apply topically.   ibuprofen (ADVIL) 200 MG tablet Take by mouth.   SYNTHROID 88 MCG tablet TAKE 1 TABLET BY MOUTH EVERY DAY BEFORE BREAKFAST   famotidine (PEPCID) 20 MG tablet Take by mouth. (Patient not taking: Reported on 02/01/2022)   magic mouthwash SOLN  (Patient not taking: Reported on 02/01/2022)   meclizine (ANTIVERT) 25 MG tablet TAKE ONE TABLET BY MOUTH EVERY 8 HOURS AS NEEDED FOR DIZZINESS (Patient not taking: Reported on  02/01/2022)   No facility-administered encounter medications on file as of 02/01/2022.    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, without mention of thyrotoxic crisis or storm    Unspecified essential hypertension 10/21/2008   Past Surgical History:  Procedure Laterality Date   ABDOMINAL HYSTERECTOMY  9417,4081   partial, fibroids   BREAST LUMPECTOMY     right,benign   MENISCUS REPAIR Left    Family History  Problem Relation Age of Onset   Goiter Mother    Heart disease Father    Hypothyroidism Sister    Stomach cancer Sister    Colon cancer Maternal Grandmother    Colon polyps Neg Hx    Esophageal cancer Neg Hx    Rectal cancer Neg Hx    Social History   Socioeconomic History   Marital status: Married    Spouse name: [name]   Number of children: Not on file   Years of education: Not on file   Highest education level: Not on file  Occupational History   Not on file  Tobacco Use   Smoking status: Never   Smokeless tobacco: Never  Vaping Use   Vaping Use: Never used  Substance and Sexual Activity   Alcohol use: Not Currently   Drug use: No   Sexual activity: Not Currently  Other Topics Concern   Not on file  Social History Narrative   Lives with husband, [name].    Right Handed   Drinks caffeine rarely.     Social Determinants of Health   Financial Resource Strain: Low Risk  (02/01/2022)   Overall Financial Resource Strain (CARDIA)    Difficulty of Paying Living Expenses: Not hard at all  Food Insecurity: No Food Insecurity (02/01/2022)   Hunger Vital Sign    Worried About Running Out of Food in the Last Year: Never true    Ran Out of Food in the Last Year: Never true  Transportation Needs: No Transportation Needs (02/01/2022)   PRAPARE - Hydrologist (Medical): No    Lack of Transportation (Non-Medical): No  Physical Activity: Insufficiently Active (02/01/2022)   Exercise Vital Sign    Days of  Exercise per Week: 3 days    Minutes of Exercise per Session: 30 min  Stress: No Stress Concern Present (02/01/2022)   [place]    Feeling of Stress : Only a little  Social Connections: Moderately Isolated (02/01/2022)   Social Connection and Isolation Panel [NHANES]    Frequency of Communication with Friends and Family: More than three times a week    Frequency of Social Gatherings with Friends and Family: Once a week    Attends Religious Services: Never    Marine scientist or Organizations: No    Attends Archivist Meetings: Never    Marital Status: Married    Tobacco Counseling Counseling given: Not  Answered   Clinical Intake:  Pre-visit preparation completed: Yes  Pain : No/denies pain     Nutritional Risks: None Diabetes: No  How often do you need to have someone help you when you read instructions, pamphlets, or other written materials from your doctor or pharmacy?: 1 - Never  Diabetic?no  Interpreter Needed?: No  Information entered by :: Kirke Shaggy, LPN   Activities of Daily Living    02/01/2022   10:00 AM  In your present state of health, do you have any difficulty performing the following activities:  Hearing? 0  Vision? 0  Difficulty concentrating or making decisions? 0  Walking or climbing stairs? 0  Dressing or bathing? 0  Doing errands, shopping? 0  Preparing Food and eating ? N  Using the Toilet? N  In the past six months, have you accidently leaked urine? N  Do you have problems with loss of bowel control? N  Managing your Medications? N  Managing your Finances? N  Housekeeping or managing your Housekeeping? N    Patient Care Team: Owens Loffler, MD as PCP - General (Family Medicine)  Indicate any recent Medical Services you may have received from other than [facility] providers in the past year (date may be approximate).     Assessment:   This is a  routine wellness examination for Melissa Cox.  Hearing/Vision screen Hearing Screening - Comments:: Wears aids Vision Screening - Comments:: Readers- Dr.Lyle  Dietary issues and exercise activities discussed: Current Exercise Habits: Home exercise routine, Type of exercise: walking, Time (Minutes): 30, Frequency (Times/Week): 3, Weekly Exercise (Minutes/Week): 90, Intensity: Mild   Goals Addressed             This Visit's Progress    DIET - EAT MORE FRUITS AND VEGETABLES         Depression Screen    02/01/2022    9:58 AM 01/27/2021   11:26 AM 09/13/2018   12:55 PM 03/09/2017    3:05 PM 10/05/2016    9:55 AM  PHQ 2/9 Scores  PHQ - 2 Score 0 0 0 0 0  PHQ- 9 Score 0  0 0     Fall Risk    02/01/2022   10:00 AM 01/27/2021   11:24 AM 09/13/2018   12:55 PM 03/09/2017    3:05 PM 10/05/2016    9:55 AM  Fall Risk   Falls in the past year? 0 0 0 Yes Yes  Number falls in past yr: 0 0  1 1  Injury with Fall? 0 0  Yes Yes  Risk for fall due to : No Fall Risks No Fall Risks     Follow up Falls prevention discussed;Falls evaluation completed Falls prevention discussed       FALL RISK PREVENTION PERTAINING TO THE HOME:  Any stairs in or around the home? Yes  If so, are there any without handrails? No  Home free of loose throw rugs in walkways, pet beds, electrical cords, etc? Yes  Adequate lighting in your home to reduce risk of falls? Yes   ASSISTIVE DEVICES UTILIZED TO PREVENT FALLS:  Life alert? No  Use of a cane, walker or w/c? Yes  Grab bars in the bathroom? No  Shower chair or bench in shower? No  Elevated toilet seat or a handicapped toilet? No     Cognitive Function:    09/13/2018   12:40 PM 03/09/2017    3:02 PM  MMSE - Mini Mental State Exam  Orientation to time 5  5  Orientation to Place 5 5  Registration 3 3  Attention/ Calculation 0 0  Recall 3 3  Language- name 2 objects 0 0  Language- repeat 1 1  Language- follow 3 step command 0 3  Language- read &  follow direction 0 0  Write a sentence 0 0  Copy design 0 0  Total score 17 20        02/01/2022   10:01 AM  6CIT Screen  What Year? 0 points  What month? 0 points  What time? 0 points  Count back from 20 0 points  Months in reverse 0 points  Repeat phrase 0 points  Total Score 0 points    Immunizations Immunization History  Administered Date(s) Administered   Fluad Quad(high Dose 65+) 01/15/2019, 01/22/2020   Hepatitis A 10/30/2006   Hepatitis B 05/25/2006, 10/30/2006, 10/21/2008   Influenza Split 01/26/2012   Influenza, High Dose Seasonal PF 01/28/2016, 01/17/2017, 02/05/2018   Influenza,inj,Quad PF,6+ Mos 02/07/2014, 03/19/2015   PFIZER(Purple Top)SARS-COV-2 Vaccination 05/10/2019, 06/02/2019, 03/03/2020   Pneumococcal Conjugate-13 06/09/2016   Pneumococcal Polysaccharide-23 06/07/2018   Td 02/21/2006   Tdap 03/03/2016    TDAP status: Up to date  Flu Vaccine status: Due, Education has been provided regarding the importance of this vaccine. Advised may receive this vaccine at local pharmacy or Health Dept. Aware to provide a copy of the vaccination record if obtained from local pharmacy or Health Dept. Verbalized acceptance and understanding.  Pneumococcal vaccine status: Up to date  Covid-19 vaccine status: Completed vaccines  Qualifies for Shingles Vaccine? Yes   Zostavax completed Yes   Shingrix Completed?: No.    Education has been provided regarding the importance of this vaccine. Patient has been advised to call insurance company to determine out of pocket expense if they have not yet received this vaccine. Advised may also receive vaccine at local pharmacy or Health Dept. Verbalized acceptance and understanding.  Screening Tests Health Maintenance  Topic Date Due   Zoster Vaccines- Shingrix (1 of 2) Never done   COVID-19 Vaccine (4 - Pfizer series) 04/28/2020   INFLUENZA VACCINE  10/26/2021   MAMMOGRAM  12/31/2022   Medicare Annual Wellness (AWV)   02/02/2023   Fecal DNA (Cologuard)  02/09/2024   TETANUS/TDAP  03/03/2026   Pneumonia Vaccine 16+ Years old  Completed   DEXA SCAN  Completed   Hepatitis C Screening  Completed   HPV VACCINES  Aged Out    Health Maintenance  Health Maintenance Due  Topic Date Due   Zoster Vaccines- Shingrix (1 of 2) Never done   COVID-19 Vaccine (4 - Pfizer series) 04/28/2020   INFLUENZA VACCINE  10/26/2021    Colorectal cancer screening: Type of screening: Cologuard. Completed 02/16/21. Repeat every 3 years  Mammogram status: Completed 12/30/20. Repeat every year  Bone Density status: Completed 07/08/21. Results reflect: Bone density results: OSTEOPENIA. Repeat every 5 years.  Lung Cancer Screening: (Low Dose CT Chest recommended if Age 64-80 years, 30 pack-year currently smoking OR have quit w/in 15years.) does not qualify.   Additional Screening:  Hepatitis C Screening: does qualify; Completed 03/09/17  Vision Screening: Recommended annual ophthalmology exams for early detection of glaucoma and other disorders of the eye. Is the patient up to date with their annual eye exam?  Yes  Who is the provider or what is the name of the office in which the patient attends annual eye exams? Dr.Lyle If pt is not established with a provider, would they like to be  referred to a provider to establish care? No .   Dental Screening: Recommended annual dental exams for proper oral hygiene  Community Resource Referral / Chronic Care Management: CRR required this visit?  No   CCM required this visit?  No      Plan:     I have personally reviewed and noted the following in the patient's chart:   Medical and social history Use of alcohol, tobacco or illicit drugs  Current medications and supplements including opioid prescriptions. Patient is not currently taking opioid prescriptions. Functional ability and status Nutritional status Physical activity Advanced directives List of other  physicians Hospitalizations, surgeries, and ER visits in previous 12 months Vitals Screenings to include cognitive, depression, and falls Referrals and appointments  In addition, I have reviewed and discussed with patient certain preventive protocols, quality metrics, and best practice recommendations. A written personalized care plan for preventive services as well as general preventive health recommendations were provided to patient.     Dionisio David, LPN   18/10/6771   Nurse Notes: none

## 2022-02-01 NOTE — Patient Instructions (Signed)
Ms. Melissa Cox , Thank you for taking time to come for your Medicare Wellness Visit. I appreciate your ongoing commitment to your health goals. Please review the following plan we discussed and let me know if I can assist you in the future.   Screening recommendations/referrals: Colonoscopy: Cologuard 02/16/21 Mammogram: 12/30/20, referral sent Bone Density: 07/08/21 Recommended yearly ophthalmology/optometry visit for glaucoma screening and checkup Recommended yearly dental visit for hygiene and checkup  Vaccinations: Influenza vaccine: n/d Pneumococcal vaccine: 06/07/18 Tdap vaccine: 03/03/16 Shingles vaccine: Zostavax 02/04/11   Covid-19:05/10/19, 06/02/19, 03/03/20  Advanced directives: no  Conditions/risks identified: none  Next appointment: Follow up in one year for your annual wellness visit 02/03/23 @ 8:45 am by phone   Preventive Care 65 Years and Older, Female Preventive care refers to lifestyle choices and visits with your health care provider that can promote health and wellness. What does preventive care include? A yearly physical exam. This is also called an annual well check. Dental exams once or twice a year. Routine eye exams. Ask your health care provider how often you should have your eyes checked. Personal lifestyle choices, including: Daily care of your teeth and gums. Regular physical activity. Eating a healthy diet. Avoiding tobacco and drug use. Limiting alcohol use. Practicing safe sex. Taking low-dose aspirin every day. Taking vitamin and mineral supplements as recommended by your health care provider. What happens during an annual well check? The services and screenings done by your health care provider during your annual well check will depend on your age, overall health, lifestyle risk factors, and family history of disease. Counseling  Your health care provider may ask you questions about your: Alcohol use. Tobacco use. Drug use. Emotional  well-being. Home and relationship well-being. Sexual activity. Eating habits. History of falls. Memory and ability to understand (cognition). Work and work Statistician. Reproductive health. Screening  You may have the following tests or measurements: Height, weight, and BMI. Blood pressure. Lipid and cholesterol levels. These may be checked every 5 years, or more frequently if you are over 70 years old. Skin check. Lung cancer screening. You may have this screening every year starting at age 26 if you have a 30-pack-year history of smoking and currently smoke or have quit within the past 15 years. Fecal occult blood test (FOBT) of the stool. You may have this test every year starting at age 73. Flexible sigmoidoscopy or colonoscopy. You may have a sigmoidoscopy every 5 years or a colonoscopy every 10 years starting at age 48. Hepatitis C blood test. Hepatitis B blood test. Sexually transmitted disease (STD) testing. Diabetes screening. This is done by checking your blood sugar (glucose) after you have not eaten for a while (fasting). You may have this done every 1-3 years. Bone density scan. This is done to screen for osteoporosis. You may have this done starting at age 47. Mammogram. This may be done every 1-2 years. Talk to your health care provider about how often you should have regular mammograms. Talk with your health care provider about your test results, treatment options, and if necessary, the need for more tests. Vaccines  Your health care provider may recommend certain vaccines, such as: Influenza vaccine. This is recommended every year. Tetanus, diphtheria, and acellular pertussis (Tdap, Td) vaccine. You may need a Td booster every 10 years. Zoster vaccine. You may need this after age 96. Pneumococcal 13-valent conjugate (PCV13) vaccine. One dose is recommended after age 51. Pneumococcal polysaccharide (PPSV23) vaccine. One dose is recommended after age 44. Talk  to your  health care provider about which screenings and vaccines you need and how often you need them. This information is not intended to replace advice given to you by your health care provider. Make sure you discuss any questions you have with your health care provider. Document Released: 04/10/2015 Document Revised: 12/02/2015 Document Reviewed: 01/13/2015 Elsevier Interactive Patient Education  2017 [place] Prevention in the Home Falls can cause injuries. They can happen to people of all ages. There are many things you can do to make your home safe and to help prevent falls. What can I do on the outside of my home? Regularly fix the edges of walkways and driveways and fix any cracks. Remove anything that might make you trip as you walk through a door, such as a raised step or threshold. Trim any bushes or trees on the path to your home. Use bright outdoor lighting. Clear any walking paths of anything that might make someone trip, such as rocks or tools. Regularly check to see if handrails are loose or broken. Make sure that both sides of any steps have handrails. Any raised decks and porches should have guardrails on the edges. Have any leaves, snow, or ice cleared regularly. Use sand or salt on walking paths during winter. Clean up any spills in your garage right away. This includes oil or grease spills. What can I do in the bathroom? Use night lights. Install grab bars by the toilet and in the tub and shower. Do not use towel bars as grab bars. Use non-skid mats or decals in the tub or shower. If you need to sit down in the shower, use a plastic, non-slip stool. Keep the floor dry. Clean up any water that spills on the floor as soon as it happens. Remove soap buildup in the tub or shower regularly. Attach bath mats securely with double-sided non-slip rug tape. Do not have throw rugs and other things on the floor that can make you trip. What can I do in the bedroom? Use night  lights. Make sure that you have a light by your bed that is easy to reach. Do not use any sheets or blankets that are too big for your bed. They should not hang down onto the floor. Have a firm chair that has side arms. You can use this for support while you get dressed. Do not have throw rugs and other things on the floor that can make you trip. What can I do in the kitchen? Clean up any spills right away. Avoid walking on wet floors. Keep items that you use a lot in easy-to-reach places. If you need to reach something above you, use a strong step stool that has a grab bar. Keep electrical cords out of the way. Do not use floor polish or wax that makes floors slippery. If you must use wax, use non-skid floor wax. Do not have throw rugs and other things on the floor that can make you trip. What can I do with my stairs? Do not leave any items on the stairs. Make sure that there are handrails on both sides of the stairs and use them. Fix handrails that are broken or loose. Make sure that handrails are as long as the stairways. Check any carpeting to make sure that it is firmly attached to the stairs. Fix any carpet that is loose or worn. Avoid having throw rugs at the top or bottom of the stairs. If you do have throw rugs,  attach them to the floor with carpet tape. Make sure that you have a light switch at the top of the stairs and the bottom of the stairs. If you do not have them, ask someone to add them for you. What else can I do to help prevent falls? Wear shoes that: Do not have high heels. Have rubber bottoms. Are comfortable and fit you well. Are closed at the toe. Do not wear sandals. If you use a stepladder: Make sure that it is fully opened. Do not climb a closed stepladder. Make sure that both sides of the stepladder are locked into place. Ask someone to hold it for you, if possible. Clearly mark and make sure that you can see: Any grab bars or handrails. First and last  steps. Where the edge of each step is. Use tools that help you move around (mobility aids) if they are needed. These include: Canes. Walkers. Scooters. Crutches. Turn on the lights when you go into a dark area. Replace any light bulbs as soon as they burn out. Set up your furniture so you have a clear path. Avoid moving your furniture around. If any of your floors are uneven, fix them. If there are any pets around you, be aware of where they are. Review your medicines with your doctor. Some medicines can make you feel dizzy. This can increase your chance of falling. Ask your doctor what other things that you can do to help prevent falls. This information is not intended to replace advice given to you by your health care provider. Make sure you discuss any questions you have with your health care provider. Document Released: 01/08/2009 Document Revised: 08/20/2015 Document Reviewed: 04/18/2014 Elsevier Interactive Patient Education  2017 [employer].

## 2022-03-01 ENCOUNTER — Encounter: Payer: Self-pay | Admitting: Endocrinology

## 2022-03-01 ENCOUNTER — Ambulatory Visit: Payer: PPO | Admitting: Endocrinology

## 2022-03-01 VITALS — BP 132/72 | HR 71 | Ht 65.0 in | Wt 175.2 lb

## 2022-03-01 DIAGNOSIS — E559 Vitamin D deficiency, unspecified: Secondary | ICD-10-CM | POA: Diagnosis not present

## 2022-03-01 DIAGNOSIS — E890 Postprocedural hypothyroidism: Secondary | ICD-10-CM | POA: Diagnosis not present

## 2022-03-01 LAB — TSH: TSH: 2.93 u[IU]/mL (ref 0.35–5.50)

## 2022-03-01 LAB — T4, FREE: Free T4: 0.98 ng/dL (ref 0.60–1.60)

## 2022-03-01 LAB — VITAMIN D 25 HYDROXY (VIT D DEFICIENCY, FRACTURES): VITD: 38.4 ng/mL (ref 30.00–100.00)

## 2022-03-01 NOTE — Progress Notes (Signed)
Patient ID: Melissa Cox, female   DOB: 1950/10/10, 71 y.o.   MRN: [id number]   Reason for Appointment:  Hypothyroidism, followup visit    History of Present Illness:   The hypothyroidism was first diagnosed in 1977 after treatment of her Graves' disease with [id number]  The patient has been treated with levothyroxine in varying doses, ranging from 100 up to 137 mcg  In 5/16 she had a high TSH level with using generic levothyroxine At that time she had been complaining of some fatigue and with increasing her dose to 112 g she started having a little better energy  She is taking brand name Synthroid long-term  Her dose of 112 mcg had been previously a stable dose; however in 4/21 since her TSH was low normal she was changed to 100 mcg of Synthroid Subsequently this was reduced further  Currently taking 88 mcg of Synthroid and is taking the prescription to the pharmacy  She is consistently regular with taking her brand-name Synthroid supplement As directed on the last visit she is taking this before eating in the morning now She does think that she has been more tired but she thinks this is from her husband's chronic conditions Also she has gained back weight from not being consistent on diet Labs not available           Not taking any calcium or iron supplements in the morning with Synthroid  Thyroid levels as follows:  Lab Results  Component Value Date   TSH 3.04 02/22/2021   TSH 0.62 08/10/2020   TSH 0.18 (L) 04/28/2020   FREET4 0.88 02/22/2021   FREET4 1.17 08/10/2020   FREET4 1.11 04/28/2020    Wt Readings from Last 3 Encounters:  03/01/22 175 lb 3.2 oz (79.5 kg)  02/01/22 163 lb (73.9 kg)  02/26/21 163 lb (73.9 kg)     Allergies as of 03/01/2022       Reactions   Demerol [meperidine] Rash   Meperidine Hcl    REACTION: rash   Diphenhydramine Hcl    REACTION: abd cramps and diarrhea   Doxycycline Nausea Only   Epinephrine Other (See Comments)    Increased heart rate    Prochlorperazine Edisylate    REACTION: increased heart rate- compazine    Alpha-gal Diarrhea, Nausea And Vomiting   Patient states throat tingling reaction, lump in throat and shaking.        Medication List        Accurate as of March 01, 2022 11:22 AM. If you have any questions, ask your nurse or doctor.          Clobetasol Propionate 0.025 % Crea Apply topically.   clobetasol cream 0.05 % Commonly known as: TEMOVATE APPLY TO AFFECTED AREA TWICE A DAY   diphenhydrAMINE 25 mg capsule Commonly known as: BENADRYL Take 25 mg by mouth every 6 (six) hours as needed for itching.   diphenhydrAMINE 12.5 MG/5ML liquid Commonly known as: BENADRYL Take by mouth.   diphenhydrAMINE 50 MG capsule Commonly known as: BENADRYL Take by mouth.   EPINEPHrine 0.3 mg/0.3 mL Soaj injection Commonly known as: EPI-PEN Inject 0.3 mg into the muscle as needed for anaphylaxis.   famotidine 20 MG tablet Commonly known as: PEPCID Take by mouth.   fexofenadine 180 MG tablet Commonly known as: ALLEGRA Take 180 mg by mouth daily.   fluocinonide cream 0.05 % Commonly known as: LIDEX Apply topically.   ibuprofen 200 MG tablet Commonly known as: ADVIL Take  by mouth.   magic mouthwash Soln   meclizine 25 MG tablet Commonly known as: ANTIVERT TAKE ONE TABLET BY MOUTH EVERY 8 HOURS AS NEEDED FOR DIZZINESS   Synthroid 88 MCG tablet Generic drug: levothyroxine TAKE 1 TABLET BY MOUTH EVERY DAY BEFORE BREAKFAST   VITAMIN C PO Take 1,000 mg by mouth daily.   VITAMIN D3 PO Take 1,000 Units by mouth daily.        Allergies:  Allergies  Allergen Reactions   Demerol [Meperidine] Rash   Meperidine Hcl     REACTION: rash   Diphenhydramine Hcl     REACTION: abd cramps and diarrhea   Doxycycline Nausea Only   Epinephrine Other (See Comments)    Increased heart rate    Prochlorperazine Edisylate     REACTION: increased heart rate- compazine     Alpha-Gal Diarrhea and Nausea And Vomiting    Patient states throat tingling reaction, lump in throat and shaking.    Past Medical History:  Diagnosis Date   Allergy to alpha-gal    Anemia    years ago - had hysterectomy to stop - hgb 5.2   Arthritis    hands   Blood transfusion without reported diagnosis    prior to hysterectomy   Cataract    beginnings    Diverticulitis 2018   GERD (gastroesophageal reflux disease)    occ -uses TUMS   Lichen sclerosus    Thyrotoxicosis without mention of goiter or other cause, without mention of thyrotoxic crisis or storm    Unspecified essential hypertension 10/21/2008    Past Surgical History:  Procedure Laterality Date   ABDOMINAL HYSTERECTOMY  8341,9622   partial, fibroids   BREAST LUMPECTOMY     right,benign   MENISCUS REPAIR Left     Family History  Problem Relation Age of Onset   Goiter Mother    Heart disease Father    Hypothyroidism Sister    Stomach cancer Sister    Colon cancer Maternal Grandmother    Colon polyps Neg Hx    Esophageal cancer Neg Hx    Rectal cancer Neg Hx     Social History:  reports that she has never smoked. She has never used smokeless tobacco. She reports that she does not currently use alcohol. She reports that she does not use drugs.  REVIEW Of SYSTEMS:   Her baseline vitamin D level was 18  She has finally started taking a vitamin D supplement 1000 units She is getting a vegan formulation from [place] and has been regular with this  Lab Results  Component Value Date   VD25OH 36.10 02/22/2021   VD25OH 29.11 (L) 04/28/2020   VD25OH 29.36 (L) 07/18/2019     Examination:   BP 132/72   Pulse 71   Ht '5\' 5"'$  (1.651 m)   Wt 175 lb 3.2 oz (79.5 kg)   SpO2 95%   BMI 29.15 kg/m    Biceps reflexes difficult to elicit but appear to be normal    Assessment   Hypothyroidism, post ablative, long-standing.  She has required lower doses of Synthroid in the last couple of years, previously  has been on as high as 137 mcg She has been continued on 88 mcg of Synthroid  She does complain of some fatigue which may be nonspecific and has gained weight  Vitamin D deficiency: Previously adequately replaced with OTC supplements   Plan:  She will stay on brand-name Synthroid from the pharmacy Will check thyroid levels today and  decide on the dosage Again discussed the Synthroid delivers program but she is finding the local pharmacy more convenient  Continue same vitamin D supplements   There are no Patient Instructions on file for this visit.   Elayne Snare 03/01/2022, 11:22 AM

## 2022-03-02 ENCOUNTER — Other Ambulatory Visit: Payer: Self-pay | Admitting: Endocrinology

## 2022-03-02 MED ORDER — SYNTHROID 88 MCG PO TABS: ORAL_TABLET | ORAL | 3 refills | Status: DC

## 2022-03-02 NOTE — Progress Notes (Signed)
Please call to let patient know that the lab results are normal and no further action needed

## 2022-06-14 NOTE — Progress Notes (Unsigned)
    Melissa Gair T. Labron Bloodgood, MD, [name] [name], [address]  Phone: [phone]  FAX: [name] - 72 y.o. female  MRN [id number]  Date of Birth: 1950/08/12  Date: 06/15/2022  PCP: Owens Loffler, MD  Referral: Owens Loffler, MD  No chief complaint on file.  Subjective:   Melissa Cox is a 72 y.o. very pleasant female patient with There is no height or weight on file to calculate BMI. who presents with the following:  The patient presents with concerns regarding nosebleeds.    Review of Systems is noted in the HPI, as appropriate  Objective:   There were no vitals taken for this visit.  GEN: No acute distress; alert,appropriate. PULM: Breathing comfortably in no respiratory distress PSYCH: Normally interactive.   Laboratory and Imaging Data:  Assessment and Plan:   ***

## 2022-06-15 ENCOUNTER — Encounter: Payer: Self-pay | Admitting: Family Medicine

## 2022-06-15 ENCOUNTER — Ambulatory Visit (INDEPENDENT_AMBULATORY_CARE_PROVIDER_SITE_OTHER): Payer: PPO | Admitting: Family Medicine

## 2022-06-15 VITALS — BP 130/80 | HR 69 | Temp 98.2°F | Ht 64.0 in | Wt 170.2 lb

## 2022-06-15 DIAGNOSIS — R040 Epistaxis: Secondary | ICD-10-CM

## 2022-06-15 MED ORDER — CLOBETASOL PROPIONATE 0.05 % EX CREA: TOPICAL_CREAM | CUTANEOUS | 1 refills | Status: DC

## 2022-06-15 MED ORDER — EPINEPHRINE 0.3 MG/0.3ML IJ SOAJ: 0.3000 mg | INTRAMUSCULAR | 3 refills | Status: DC | PRN

## 2022-06-21 DIAGNOSIS — J340 Abscess, furuncle and carbuncle of nose: Secondary | ICD-10-CM | POA: Diagnosis not present

## 2022-06-21 DIAGNOSIS — R040 Epistaxis: Secondary | ICD-10-CM | POA: Diagnosis not present

## 2022-06-27 DIAGNOSIS — R040 Epistaxis: Secondary | ICD-10-CM | POA: Diagnosis not present

## 2022-08-02 NOTE — Progress Notes (Unsigned)
    Giordan Fordham T. Hedwig Mcfall, MD, CAQ Sports Medicine [facility] at [facility] [address] [place], [address]  Phone: [phone]  FAX: [phone]  Melissa Cox - 72 y.o. female  MRN [id number]  Date of Birth: June 09, 1950  Date: 08/03/2022  PCP: Hannah Beat, MD  Referral: Hannah Beat, MD  No chief complaint on file.  Subjective:   Melissa Cox is a 72 y.o. very pleasant female patient with There is no height or weight on file to calculate BMI. who presents with the following:  Patient is here to follow-up after a tick bite.    Review of Systems is noted in the HPI, as appropriate  Objective:   There were no vitals taken for this visit.  GEN: No acute distress; alert,appropriate. PULM: Breathing comfortably in no respiratory distress PSYCH: Normally interactive.   Laboratory and Imaging Data:  Assessment and Plan:   ***

## 2022-08-03 ENCOUNTER — Ambulatory Visit (INDEPENDENT_AMBULATORY_CARE_PROVIDER_SITE_OTHER): Payer: PPO | Admitting: Family Medicine

## 2022-08-03 ENCOUNTER — Encounter: Payer: Self-pay | Admitting: Family Medicine

## 2022-08-03 VITALS — BP 120/80 | HR 75 | Temp 97.9°F | Ht 64.0 in | Wt 164.1 lb

## 2022-08-03 DIAGNOSIS — W57XXXA Bitten or stung by nonvenomous insect and other nonvenomous arthropods, initial encounter: Secondary | ICD-10-CM | POA: Diagnosis not present

## 2022-08-03 DIAGNOSIS — R739 Hyperglycemia, unspecified: Secondary | ICD-10-CM | POA: Diagnosis not present

## 2022-08-03 DIAGNOSIS — Z79899 Other long term (current) drug therapy: Secondary | ICD-10-CM | POA: Diagnosis not present

## 2022-08-03 DIAGNOSIS — M2550 Pain in unspecified joint: Secondary | ICD-10-CM | POA: Diagnosis not present

## 2022-08-03 DIAGNOSIS — R5383 Other fatigue: Secondary | ICD-10-CM | POA: Diagnosis not present

## 2022-08-03 DIAGNOSIS — E782 Mixed hyperlipidemia: Secondary | ICD-10-CM

## 2022-08-03 DIAGNOSIS — Z0000 Encounter for general adult medical examination without abnormal findings: Secondary | ICD-10-CM

## 2022-08-03 DIAGNOSIS — E559 Vitamin D deficiency, unspecified: Secondary | ICD-10-CM

## 2022-08-03 DIAGNOSIS — E890 Postprocedural hypothyroidism: Secondary | ICD-10-CM

## 2022-08-03 LAB — LIPID PANEL
Cholesterol: 242 mg/dL — ABNORMAL HIGH (ref 0–200)
HDL: 58.1 mg/dL (ref >39.00)
LDL Cholesterol: 158 mg/dL — ABNORMAL HIGH (ref 0–99)
NonHDL: 184.01
Total CHOL/HDL Ratio: 4
Triglycerides: 131 mg/dL (ref 0.0–149.0)
VLDL: 26.2 mg/dL (ref 0.0–40.0)

## 2022-08-03 LAB — CBC WITH DIFFERENTIAL/PLATELET
Basophils Absolute: 0.1 10*3/uL (ref 0.0–0.1)
Basophils Relative: 0.8 % (ref 0.0–3.0)
Eosinophils Absolute: 0.2 10*3/uL (ref 0.0–0.7)
Eosinophils Relative: 2 % (ref 0.0–5.0)
HCT: 42.3 % (ref 36.0–46.0)
Hemoglobin: 14.3 g/dL (ref 12.0–15.0)
Lymphocytes Relative: 26.1 % (ref 12.0–46.0)
Lymphs Abs: 2.3 10*3/uL (ref 0.7–4.0)
MCHC: 33.9 g/dL (ref 30.0–36.0)
MCV: 87.3 fl (ref 78.0–100.0)
Monocytes Absolute: 0.7 10*3/uL (ref 0.1–1.0)
Monocytes Relative: 7.5 % (ref 3.0–12.0)
Neutro Abs: 5.7 10*3/uL (ref 1.4–7.7)
Neutrophils Relative %: 63.6 % (ref 43.0–77.0)
Platelets: 332 10*3/uL (ref 150.0–400.0)
RBC: 4.84 Mil/uL (ref 3.87–5.11)
RDW: 13.7 % (ref 11.5–15.5)
WBC: 8.9 10*3/uL (ref 4.0–10.5)

## 2022-08-03 LAB — HEPATIC FUNCTION PANEL
ALT: 10 U/L (ref 0–35)
AST: 14 U/L (ref 0–37)
Albumin: 4.4 g/dL (ref 3.5–5.2)
Alkaline Phosphatase: 78 U/L (ref 39–117)
Bilirubin, Direct: 0.1 mg/dL (ref 0.0–0.3)
Total Bilirubin: 0.7 mg/dL (ref 0.2–1.2)
Total Protein: 7.4 g/dL (ref 6.0–8.3)

## 2022-08-03 LAB — BASIC METABOLIC PANEL
BUN: 17 mg/dL (ref 6–23)
CO2: 27 mEq/L (ref 19–32)
Calcium: 9.9 mg/dL (ref 8.4–10.5)
Chloride: 103 mEq/L (ref 96–112)
Creatinine, Ser: 1.16 mg/dL (ref 0.40–1.20)
GFR: 47.16 mL/min — ABNORMAL LOW (ref >60.00)
Glucose, Bld: 96 mg/dL (ref 70–99)
Potassium: 4.1 mEq/L (ref 3.5–5.1)
Sodium: 141 mEq/L (ref 135–145)

## 2022-08-03 LAB — VITAMIN D 25 HYDROXY (VIT D DEFICIENCY, FRACTURES): VITD: 26.62 ng/mL — ABNORMAL LOW (ref 30.00–100.00)

## 2022-08-03 LAB — TSH: TSH: 9.27 u[IU]/mL — ABNORMAL HIGH (ref 0.35–5.50)

## 2022-08-03 LAB — T3, FREE: T3, Free: 2.9 pg/mL (ref 2.3–4.2)

## 2022-08-03 LAB — T4, FREE: Free T4: 0.87 ng/dL (ref 0.60–1.60)

## 2022-08-03 LAB — HEMOGLOBIN A1C: Hgb A1c MFr Bld: 5.8 % (ref 4.6–6.5)

## 2022-08-03 NOTE — Patient Instructions (Signed)
  You do not need a referral to make a mammogram appointment, and you may call to make her own mammogram appointment directly around your schedule.  MAMMOGRAPHY IN [place]:  [facility] [phone] [address] [place], [place] [address]  [facility] ([facility]) [address] [place], [place] [address] Phone: [phone] Toll Free: [phone]  MAMMOGRAPHY IN [place]:  [facility] ([place] or [place]) [phone] Located on the campus of [facility] ()  [facility] ([place] [facility]) [address]  [place], [place] [address]  

## 2022-08-06 LAB — B. BURGDORFI ANTIBODIES BY WB
B burgdorferi IgG Abs (IB): NEGATIVE
B burgdorferi IgM Abs (IB): NEGATIVE
Lyme Disease 18 kD IgG: NONREACTIVE
Lyme Disease 23 kD IgG: NONREACTIVE
Lyme Disease 23 kD IgM: NONREACTIVE
Lyme Disease 28 kD IgG: NONREACTIVE
Lyme Disease 30 kD IgG: NONREACTIVE
Lyme Disease 39 kD IgG: NONREACTIVE
Lyme Disease 39 kD IgM: NONREACTIVE
Lyme Disease 41 kD IgG: NONREACTIVE
Lyme Disease 41 kD IgM: NONREACTIVE
Lyme Disease 45 kD IgG: NONREACTIVE
Lyme Disease 58 kD IgG: REACTIVE — AB
Lyme Disease 66 kD IgG: NONREACTIVE
Lyme Disease 93 kD IgG: REACTIVE — AB

## 2022-08-06 LAB — ROCKY MTN SPOTTED FVR ABS PNL(IGG+IGM)
RMSF IgG: NOT DETECTED
RMSF IgM: NOT DETECTED

## 2022-08-06 LAB — EHRLICHIA ANTIBODY PANEL
E. CHAFFEENSIS AB IGG: <1:64 {titer}
E. CHAFFEENSIS AB IGM: <1:20 {titer}

## 2022-09-07 ENCOUNTER — Telehealth: Payer: Self-pay

## 2022-09-07 DIAGNOSIS — E890 Postprocedural hypothyroidism: Secondary | ICD-10-CM

## 2022-09-07 NOTE — Telephone Encounter (Signed)
Patient called in regarding Synthroid. Patient stated that the medication caused an adverse reaction due to its ingredients and would like a compound medication that doesn't have alpha-gal.

## 2022-09-08 ENCOUNTER — Telehealth: Payer: Self-pay

## 2022-09-08 MED ORDER — AMBULATORY NON FORMULARY MEDICATION
3 refills | Status: DC
Start: 2022-09-08 — End: 2022-09-14

## 2022-09-08 NOTE — Telephone Encounter (Signed)
Returned patients call regarding medication being sent to [facility] via fax. Rx was faxed over by S. Levens CMA

## 2022-09-08 NOTE — Telephone Encounter (Signed)
Medication sent. Please schedule patient for 6 weeks

## 2022-09-08 NOTE — Telephone Encounter (Signed)
error 

## 2022-09-08 NOTE — Telephone Encounter (Signed)
Attempted to contact pharmacy but no answer.  Printed rx sent to [facility].

## 2022-09-12 ENCOUNTER — Telehealth: Payer: Self-pay

## 2022-09-12 NOTE — Telephone Encounter (Signed)
[name],  Patient called In saying she never received medication that was sent over to [facility]. Are you able to fax it back in?

## 2022-09-13 ENCOUNTER — Telehealth: Payer: Self-pay

## 2022-09-13 DIAGNOSIS — E890 Postprocedural hypothyroidism: Secondary | ICD-10-CM

## 2022-09-13 NOTE — Telephone Encounter (Signed)
Pharmacy called for you to write a script for mrs Shockley to have synthroid 88 mcg compounded for alpha-gal. Patient can't have animal ingredients in medication.

## 2022-09-14 ENCOUNTER — Telehealth: Payer: Self-pay | Admitting: Family Medicine

## 2022-09-14 ENCOUNTER — Encounter: Payer: Self-pay | Admitting: Family Medicine

## 2022-09-14 ENCOUNTER — Ambulatory Visit (INDEPENDENT_AMBULATORY_CARE_PROVIDER_SITE_OTHER): Payer: PPO | Admitting: Family Medicine

## 2022-09-14 VITALS — BP 130/80 | HR 79 | Temp 98.3°F | Ht 64.0 in | Wt 164.0 lb

## 2022-09-14 DIAGNOSIS — E782 Mixed hyperlipidemia: Secondary | ICD-10-CM

## 2022-09-14 DIAGNOSIS — N1831 Chronic kidney disease, stage 3a: Secondary | ICD-10-CM | POA: Diagnosis not present

## 2022-09-14 DIAGNOSIS — N183 Chronic kidney disease, stage 3 unspecified: Secondary | ICD-10-CM | POA: Insufficient documentation

## 2022-09-14 DIAGNOSIS — E890 Postprocedural hypothyroidism: Secondary | ICD-10-CM

## 2022-09-14 DIAGNOSIS — E559 Vitamin D deficiency, unspecified: Secondary | ICD-10-CM

## 2022-09-14 MED ORDER — AMBULATORY NON FORMULARY MEDICATION: 3 refills | Status: DC

## 2022-09-14 NOTE — Telephone Encounter (Signed)
Her medication was faxed in and the pharmacy stated it look like a copy so she wanted you to write a new script or call it in

## 2022-09-14 NOTE — Patient Instructions (Signed)
Vitamin D Deficiency - start 4,000 units a day

## 2022-09-14 NOTE — Progress Notes (Signed)
Melissa Cox T. Melissa Sinquefield, MD, CAQ Sports Medicine [facility] [address] [place], [address]  Phone: [phone]  FAX: [phone]  Melissa Cox - 72 y.o. female  MRN [id number]  Date of Birth: Jul 19, 1950  Date: 09/14/2022  PCP: Hannah Beat, MD  Referral: Hannah Beat, MD  Chief Complaint  Patient presents with   Discuss Lab Results   Medication Management    Discuss Thyroid Medication   Subjective:   Melissa Cox is a 72 y.o. very pleasant female patient with Body mass index is 28.15 kg/m. who presents with the following:  She is a very pleasant lady, well-known for many years.  She presents in follow-up after some recent blood work with some concerns.  Chronic kidney disease stage III, patient's renal function has mildly worsened compared to recent other blood work draws.  Cholesterol came back fairly high on her last blood draw, as well.  My initial plan was for her to work on diet and exercise and try to drop this naturally.    Lab Results  Component Value Date   CHOL 242 (H) 08/03/2022   Lab Results  Component Value Date   HDL 58.10 08/03/2022   Lab Results  Component Value Date   LDLCALC 158 (H) 08/03/2022   Lab Results  Component Value Date   TRIG 131.0 08/03/2022   Lab Results  Component Value Date   CHOLHDL 4 08/03/2022    Recent blood work also showed vitamin D deficiency.  I recommended that she start 4000 units daily.  Results reviewed as above.   Hypothyroidism.  She has been trying to get her compounded thyroid medication for 10 days from her endocrinologist.  She asked me to contact them to see if I can help facilitate that.  Results for orders placed or performed in visit on 08/03/22  B. burgdorfi antibodies by WB  Result Value Ref Range   B burgdorferi IgG Abs (IB) NEGATIVE NEGATIVE   Lyme Disease 18 kD IgG NON-REACTIVE    Lyme Disease 23 kD IgG NON-REACTIVE    Lyme Disease 28 kD  IgG NON-REACTIVE    Lyme Disease 30 kD IgG NON-REACTIVE    Lyme Disease 39 kD IgG NON-REACTIVE    Lyme Disease 41 kD IgG NON-REACTIVE    Lyme Disease 45 kD IgG NON-REACTIVE    Lyme Disease 58 kD IgG REACTIVE (A)    Lyme Disease 66 kD IgG NON-REACTIVE    Lyme Disease 93 kD IgG REACTIVE (A)    B burgdorferi IgM Abs (IB) NEGATIVE NEGATIVE   Lyme Disease 23 kD IgM NON-REACTIVE    Lyme Disease 39 kD IgM NON-REACTIVE    Lyme Disease 41 kD IgM NON-REACTIVE   Rocky mtn spotted fvr abs pnl(IgG+IgM)  Result Value Ref Range   RMSF IgG Not Detected Not Detected   RMSF IgM Not Detected Not Detected  Ehrlichia antibody panel  Result Value Ref Range   E. CHAFFEENSIS AB IGG <1:64    E. CHAFFEENSIS AB IGM <1:20    INTERPRETATION    Basic metabolic panel  Result Value Ref Range   Sodium 141 135 - 145 mEq/L   Potassium 4.1 3.5 - 5.1 mEq/L   Chloride 103 96 - 112 mEq/L   CO2 27 19 - 32 mEq/L   Glucose, Bld 96 70 - 99 mg/dL   BUN 17 6 - 23 mg/dL   Creatinine, Ser 6.96 0.40 - 1.20 mg/dL   GFR 29.52 (L) >  60.00 mL/min   Calcium 9.9 8.4 - 10.5 mg/dL  CBC with Differential/Platelet  Result Value Ref Range   WBC 8.9 4.0 - 10.5 K/uL   RBC 4.84 3.87 - 5.11 Mil/uL   Hemoglobin 14.3 12.0 - 15.0 g/dL   HCT 21.3 08.6 - 57.8 %   MCV 87.3 78.0 - 100.0 fl   MCHC 33.9 30.0 - 36.0 g/dL   RDW 46.9 62.9 - 52.8 %   Platelets 332.0 150.0 - 400.0 K/uL   Neutrophils Relative % 63.6 43.0 - 77.0 %   Lymphocytes Relative 26.1 12.0 - 46.0 %   Monocytes Relative 7.5 3.0 - 12.0 %   Eosinophils Relative 2.0 0.0 - 5.0 %   Basophils Relative 0.8 0.0 - 3.0 %   Neutro Abs 5.7 1.4 - 7.7 K/uL   Lymphs Abs 2.3 0.7 - 4.0 K/uL   Monocytes Absolute 0.7 0.1 - 1.0 K/uL   Eosinophils Absolute 0.2 0.0 - 0.7 K/uL   Basophils Absolute 0.1 0.0 - 0.1 K/uL  Hepatic function panel  Result Value Ref Range   Total Bilirubin 0.7 0.2 - 1.2 mg/dL   Bilirubin, Direct 0.1 0.0 - 0.3 mg/dL   Alkaline Phosphatase 78 39 - 117 U/L   AST 14 0  - 37 U/L   ALT 10 0 - 35 U/L   Total Protein 7.4 6.0 - 8.3 g/dL   Albumin 4.4 3.5 - 5.2 g/dL  Hemoglobin U1L  Result Value Ref Range   Hgb A1c MFr Bld 5.8 4.6 - 6.5 %  Lipid panel  Result Value Ref Range   Cholesterol 242 (H) 0 - 200 mg/dL   Triglycerides 244.0 0.0 - 149.0 mg/dL   HDL 10.27 >25.36 mg/dL   VLDL 64.4 0.0 - 03.4 mg/dL   LDL Cholesterol 742 (H) 0 - 99 mg/dL   Total CHOL/HDL Ratio 4    NonHDL 184.01   TSH  Result Value Ref Range   TSH 9.27 (H) 0.35 - 5.50 uIU/mL  T4, free  Result Value Ref Range   Free T4 0.87 0.60 - 1.60 ng/dL  T3, free  Result Value Ref Range   T3, Free 2.9 2.3 - 4.2 pg/mL  VITAMIN D 25 Hydroxy (Vit-D Deficiency, Fractures)  Result Value Ref Range   VITD 26.62 (L) 30.00 - 100.00 ng/mL     Review of Systems is noted in the HPI, as appropriate  Objective:   BP 130/80 (BP Location: Left Arm, Patient Position: Sitting, Cuff Size: Normal)   Pulse 79   Temp 98.3 F (36.8 C) (Temporal)   Ht 5\' 4"  (1.626 m)   Wt 164 lb (74.4 kg)   SpO2 96%   BMI 28.15 kg/m   GEN: No acute distress; alert,appropriate. PULM: Breathing comfortably in no respiratory distress PSYCH: Normally interactive.   Laboratory and Imaging Data:  Assessment and Plan:     ICD-10-CM   1. Stage 3a chronic kidney disease (HCC)  N18.31 Basic metabolic panel    2. Mixed hyperlipidemia  E78.2 Lipid panel    3. Vitamin D deficiency  E55.9     4. Hypothyroidism following radioiodine therapy  E89.0      Hyperlipidemia.  Recently elevated on blood work, she has been to do a dedicated trial of eating less cholesterol saturated foods and saturated fat, and she is going to get a repeat blood draw in 2 months.  Chronic kidney disease stage III, this is the first time she has ever had any kind of impaired renal function.  I will get a repeat this in 2 months.  Hopefully, this will just be a transient result.  Hypothyroidism as above.  She has been having difficulty getting her  medication, she has been out for 10 days.  I sent a message on her behalf to the endocrinology staff and her MD.  Vitamin D deficiency, start vitamin D 4000 units a day.  Medication Management during today's office visit: No orders of the defined types were placed in this encounter.  There are no discontinued medications.  Orders placed today for conditions managed today: Orders Placed This Encounter  Procedures   Lipid panel   Basic metabolic panel    Disposition: Return in about 2 months (around 11/14/2022) for AM fasting lab.  Dragon [employer] One speech-to-text software was used for transcription in this dictation.  Possible transcriptional errors can occur using Animal nutritionist.   Signed,  Elpidio Galea. Asa Baudoin, MD   Outpatient Encounter Medications as of 09/14/2022  Medication Sig   AMBULATORY NON FORMULARY MEDICATION Synthroid 88 MCG Compounded Medication use daily before breakfast   Ascorbic Acid (VITAMIN C PO) Take 1,000 mg by mouth daily.   Cholecalciferol (VITAMIN D3 PO) Take 1,000 Units by mouth daily.   clobetasol cream (TEMOVATE) 0.05 % APPLY TO AFFECTED AREA TWICE A DAY   diphenhydrAMINE (BENADRYL) 25 mg capsule Take 25 mg by mouth every 6 (six) hours as needed for itching.   diphenhydrAMINE HCl, Sleep, (UNISOM SLEEPMELTS PO) Take 1 each by mouth 3 times/day as needed-between meals & bedtime (for allergic reaction).   EPINEPHrine 0.3 mg/0.3 mL IJ SOAJ injection Inject 0.3 mg into the muscle as needed for anaphylaxis.   famotidine (PEPCID) 20 MG tablet Take by mouth.   fexofenadine (ALLEGRA) 180 MG tablet Take 180 mg by mouth daily.   ibuprofen (ADVIL) 200 MG tablet Take by mouth.   SYNTHROID 88 MCG tablet TAKE 1 TABLET BY MOUTH EVERY DAY BEFORE BREAKFAST   [DISCONTINUED] AMBULATORY NON FORMULARY MEDICATION Synthroid 88 MCG Compounded Medication use daily before breakfast   No facility-administered encounter medications on file as of 09/14/2022.

## 2022-09-14 NOTE — Telephone Encounter (Signed)
Melissa Cox is in my office right now.  She has been out of her thyroid medication for 10 days.  [facility] right now, and they do not have her prescription.  She asked me to contact you.  She is somewhat upset. Please help her as expeditiously as you are able.   [facility] FAX # [phone] Phone # [phone]

## 2022-09-14 NOTE — Telephone Encounter (Signed)
Verbal rx has been called in to [facility].  I spoke with Leotis Shames and she took the verbal #30 with 3 refills.

## 2022-09-15 NOTE — Telephone Encounter (Signed)
Synthroid 88 mcg has been called to [facility] @ [phone]

## 2022-10-27 ENCOUNTER — Ambulatory Visit: Payer: PPO | Admitting: Endocrinology

## 2022-10-27 ENCOUNTER — Encounter: Payer: Self-pay | Admitting: Endocrinology

## 2022-10-27 DIAGNOSIS — E782 Mixed hyperlipidemia: Secondary | ICD-10-CM

## 2022-10-27 DIAGNOSIS — N1831 Chronic kidney disease, stage 3a: Secondary | ICD-10-CM

## 2022-10-27 DIAGNOSIS — E890 Postprocedural hypothyroidism: Secondary | ICD-10-CM

## 2022-10-27 LAB — LIPID PANEL
Cholesterol: 219 mg/dL — ABNORMAL HIGH (ref 0–200)
HDL: 52 mg/dL (ref >39.00)
LDL Cholesterol: 139 mg/dL — ABNORMAL HIGH (ref 0–99)
NonHDL: 166.96
Total CHOL/HDL Ratio: 4
Triglycerides: 140 mg/dL (ref 0.0–149.0)
VLDL: 28 mg/dL (ref 0.0–40.0)

## 2022-10-27 LAB — BASIC METABOLIC PANEL
BUN: 19 mg/dL (ref 6–23)
CO2: 24 mEq/L (ref 19–32)
Calcium: 9.7 mg/dL (ref 8.4–10.5)
Chloride: 106 mEq/L (ref 96–112)
Creatinine, Ser: 0.91 mg/dL (ref 0.40–1.20)
GFR: 63 mL/min (ref >60.00)
Glucose, Bld: 101 mg/dL — ABNORMAL HIGH (ref 70–99)
Potassium: 3.8 mEq/L (ref 3.5–5.1)
Sodium: 141 mEq/L (ref 135–145)

## 2022-10-27 LAB — T4, FREE: Free T4: 0.81 ng/dL (ref 0.60–1.60)

## 2022-10-27 LAB — TSH: TSH: 6.27 u[IU]/mL — ABNORMAL HIGH (ref 0.35–5.50)

## 2022-10-27 NOTE — Progress Notes (Signed)
Thyroid level is low, she will need to take an extra half tablet twice a week of the 88 mcg levothyroxine and schedule follow-up for 2 months instead of December

## 2022-10-27 NOTE — Progress Notes (Signed)
Patient ID: Thurmond Butts, female   DOB: 1950/11/14, 72 y.o.   MRN: [id number]   Reason for Appointment:  Hypothyroidism, followup visit    History of Present Illness:   The hypothyroidism was first diagnosed in 1977 after treatment of her Graves' disease with I-131  The patient has been treated with levothyroxine in varying doses, ranging from 100 up to 137 mcg  In 5/16 she had a high TSH level with using generic levothyroxine At that time she had been complaining of some fatigue and with increasing her dose to 112 g she started having a little better energy  She is taking brand name Synthroid long-term  Her dose of 112 mcg had been previously a stable dose; however in 4/21 since her TSH was low normal she required progressively lower doses  Currently taking 88 mcg of levothyroxine In June she reported that she was having episodes of nausea, shakiness and dizziness and when she found that symptoms occurred after taking levothyroxine early in the morning she associated this with her alpha gal sensitivity She is now taking a custom prepared levothyroxine supplement from [facility] for about 5 weeks  Since she is switched over to the new progression she has not had any of these reactions As before she is taking this before eating in the morning, without any other supplements, taking this regularly every day She does feel subjectively better and does not complain of any unusual fatigue No recent cold intolerance or weight change  Although her TSH was 9.3 done by PCP about 3 months ago no changes in her medication were done Labs not available from this visit  Thyroid levels as follows:  Lab Results  Component Value Date   TSH 9.27 (H) 08/03/2022   TSH 2.93 03/01/2022   TSH 3.04 02/22/2021   FREET4 0.87 08/03/2022   FREET4 0.98 03/01/2022   FREET4 0.88 02/22/2021    Wt Readings from Last 3 Encounters:  10/27/22 163 lb (73.9 kg)  09/14/22 164 lb (74.4 kg)   08/03/22 164 lb 2 oz (74.4 kg)     Allergies as of 10/27/2022       Reactions   Demerol [meperidine] Rash   Meperidine Hcl    REACTION: rash   Diphenhydramine Hcl    REACTION: abd cramps and diarrhea   Doxycycline Nausea Only   Prochlorperazine Edisylate    REACTION: increased heart rate- compazine    Alpha-gal Diarrhea, Nausea And Vomiting   Patient states throat tingling reaction, lump in throat and shaking.        Medication List        Accurate as of October 27, 2022  8:42 AM. If you have any questions, ask your nurse or doctor.          AMBULATORY NON FORMULARY MEDICATION Synthroid 88 MCG Compounded Medication use daily before breakfast   clobetasol cream 0.05 % Commonly known as: TEMOVATE APPLY TO AFFECTED AREA TWICE A DAY   diphenhydrAMINE 25 mg capsule Commonly known as: BENADRYL Take 25 mg by mouth every 6 (six) hours as needed for itching.   EPINEPHrine 0.3 mg/0.3 mL Soaj injection Commonly known as: EPI-PEN Inject 0.3 mg into the muscle as needed for anaphylaxis.   famotidine 20 MG tablet Commonly known as: PEPCID Take by mouth.   fexofenadine 180 MG tablet Commonly known as: ALLEGRA Take 180 mg by mouth daily.   ibuprofen 200 MG tablet Commonly known as: ADVIL Take by mouth.   Synthroid  88 MCG tablet Generic drug: levothyroxine TAKE 1 TABLET BY MOUTH EVERY DAY BEFORE BREAKFAST   UNISOM SLEEPMELTS PO Take 1 each by mouth 3 times/day as needed-between meals & bedtime (for allergic reaction).   VITAMIN C PO Take 1,000 mg by mouth daily.   VITAMIN D3 PO Take 1,000 Units by mouth daily.        Allergies:  Allergies  Allergen Reactions   Demerol [Meperidine] Rash   Meperidine Hcl     REACTION: rash   Diphenhydramine Hcl     REACTION: abd cramps and diarrhea   Doxycycline Nausea Only   Prochlorperazine Edisylate     REACTION: increased heart rate- compazine    Alpha-Gal Diarrhea and Nausea And Vomiting    Patient states throat  tingling reaction, lump in throat and shaking.    Past Medical History:  Diagnosis Date   Allergy to alpha-gal    Anemia    years ago - had hysterectomy to stop - hgb 5.2   Arthritis    hands   Blood transfusion without reported diagnosis    prior to hysterectomy   Cataract    beginnings    Diverticulitis 2018   GERD (gastroesophageal reflux disease)    occ -uses TUMS   Lichen sclerosus    Thyrotoxicosis without mention of goiter or other cause, without mention of thyrotoxic crisis or storm    Unspecified essential hypertension 10/21/2008    Past Surgical History:  Procedure Laterality Date   ABDOMINAL HYSTERECTOMY  7829,5621   partial, fibroids   BREAST LUMPECTOMY     right,benign   MENISCUS REPAIR Left     Family History  Problem Relation Age of Onset   Goiter Mother    Heart disease Father    Hypothyroidism Sister    Stomach cancer Sister    Colon cancer Maternal Grandmother    Colon polyps Neg Hx    Esophageal cancer Neg Hx    Rectal cancer Neg Hx     Social History:  reports that she has never smoked. She has never used smokeless tobacco. She reports that she does not currently use alcohol. She reports that she does not use drugs.  REVIEW Of SYSTEMS:   Her baseline vitamin D level was 18  She has finally started taking a vitamin D supplement 1000 units She is getting a vegan formulation from [place] and has been regular with this  Lab Results  Component Value Date   VD25OH 26.62 (L) 08/03/2022   VD25OH 38.40 03/01/2022   VD25OH 36.10 02/22/2021     Examination:   BP 118/60   Pulse 80   Ht 5\' 4"  (1.626 m)   Wt 163 lb (73.9 kg)   SpO2 96%   BMI 27.98 kg/m   Biceps reflexes show normal relaxation No facial puffiness or peripheral edema No dry skin    Assessment   Hypothyroidism, post ablative, long-standing.  She has required lower doses of Synthroid in the last couple of years, previously has been on as high as 137 mcg She has been  recently on 88 mcg However instead of Synthroid she is getting a especially prepared levothyroxine without any traces of animal protein from [facility] With this she has not had her reactions that she normally has with alpha gal condition No fatigue  Vitamin D deficiency: on  OTC 5000 units vitamin D3 from her PCP recently and starting to take this with food   Plan:  Thyroid levels to be checked today  There are no Patient Instructions on file for this visit.   Reather Littler 10/27/2022, 8:42 AM   Addendum: TSH is 6.3 and she will now take an extra half tablet twice a week and follow-up in 2 months  Oyinkansola Truax Lucianne Muss

## 2022-11-14 ENCOUNTER — Other Ambulatory Visit: Payer: PPO

## 2023-01-18 ENCOUNTER — Ambulatory Visit: Payer: PPO | Admitting: Podiatry

## 2023-01-20 ENCOUNTER — Telehealth: Payer: Self-pay

## 2023-01-20 ENCOUNTER — Other Ambulatory Visit: Payer: Self-pay

## 2023-01-20 DIAGNOSIS — E890 Postprocedural hypothyroidism: Secondary | ICD-10-CM

## 2023-01-20 MED ORDER — SYNTHROID 88 MCG PO TABS
ORAL_TABLET | ORAL | 3 refills | Status: DC
Start: 2023-01-20 — End: 2023-03-03

## 2023-01-20 MED ORDER — SYNTHROID 88 MCG PO TABS
ORAL_TABLET | ORAL | 3 refills | Status: DC
Start: 2023-01-20 — End: 2023-01-20

## 2023-01-20 NOTE — Telephone Encounter (Signed)
Pharmacy tech called requesting approval to compound patient's medication Synthroid. Per Tech they usually compound but would like MD approval for the refill.

## 2023-01-20 NOTE — Telephone Encounter (Signed)
Renew the prescription.  Okay to compound the patient medications Synthroid.

## 2023-01-23 ENCOUNTER — Ambulatory Visit: Payer: PPO | Admitting: Podiatry

## 2023-01-23 ENCOUNTER — Encounter: Payer: Self-pay | Admitting: Podiatry

## 2023-01-23 ENCOUNTER — Ambulatory Visit (INDEPENDENT_AMBULATORY_CARE_PROVIDER_SITE_OTHER): Payer: PPO

## 2023-01-23 VITALS — BP 154/80 | HR 67

## 2023-01-23 DIAGNOSIS — S90851A Superficial foreign body, right foot, initial encounter: Secondary | ICD-10-CM

## 2023-01-23 DIAGNOSIS — M21622 Bunionette of left foot: Secondary | ICD-10-CM

## 2023-01-23 DIAGNOSIS — M2012 Hallux valgus (acquired), left foot: Secondary | ICD-10-CM | POA: Diagnosis not present

## 2023-01-23 NOTE — Patient Instructions (Addendum)
More silicone pads can be purchased from:  [email or web address]  

## 2023-01-23 NOTE — Progress Notes (Signed)
  Subjective:  Patient ID: Melissa Cox, female    DOB: 12-Feb-1951,  MRN: [id number]  Chief Complaint  Patient presents with   Foot Pain    "I have a Tailors Bunion on my left foot.  I also have a black place on the bottom of my right foot."    72 y.o. female presents with the above complaint. History confirmed with patient.  She noticed the dark spot developing over the last couple months, but does a lot of gardening not sure if she still puts updated  Objective:  Physical Exam: warm, good capillary refill, no trophic changes or ulcerative lesions, normal DP and PT pulses and normal sensory exam. Left Foot: She has mild tailor's bunion deformity, there is tenderness lateral  Right foot shows a plantar forefoot dried blood blister with small punctate central area consistent with a puncture wound  Radiographs: X-ray of the left foot: Mild tailor's bunion deformity with increased intermetatarsal angle and hypertrophy of the lateral metatarsal head, relatively unchanged since last visit Assessment:   1. Tailor's bunionette, left   2. Foreign body of skin of plantar aspect of right foot      Plan:  Patient was evaluated and treated and all questions answered.  I reviewed her radiographs taken today and there is no major change in alignment, she previously had success with silicone offloading tailor's bunion pads and she will continue utilize these additional pads were dispensed today I gave her information on where to purchase these if she would like them.  Return to see me if not improving for surgical intervention if it becomes consistently painful.  She had a new issue today of a spot on her right foot consistent with a previous puncture wound and healed blood blister there is no evidence of this was a pigmented lesion.  I debrided the lesion and it was evacuated easily.  No further in signs of infection or deep puncture wound remained.  She will return to see me as needed if it does  not improve or worsens.  Return if symptoms worsen or fail to improve.

## 2023-02-01 ENCOUNTER — Ambulatory Visit (INDEPENDENT_AMBULATORY_CARE_PROVIDER_SITE_OTHER): Payer: PPO

## 2023-02-01 VITALS — Ht 64.0 in | Wt 163.0 lb

## 2023-02-01 DIAGNOSIS — Z0000 Encounter for general adult medical examination without abnormal findings: Secondary | ICD-10-CM

## 2023-02-01 DIAGNOSIS — Z1231 Encounter for screening mammogram for malignant neoplasm of breast: Secondary | ICD-10-CM | POA: Diagnosis not present

## 2023-02-01 NOTE — Progress Notes (Signed)
Subjective:   Melissa Cox is a 72 y.o. female who presents for Medicare Annual (Subsequent) preventive examination.  Visit Complete: Virtual I connected with  Melissa Cox on 02/01/23 by a audio enabled telemedicine application and verified that I am speaking with the correct person using two identifiers.  Patient Location: Home  Provider Location: Office/Clinic  I discussed the limitations of evaluation and management by telemedicine. The patient expressed understanding and agreed to proceed.  Vital Signs: Because this visit was a virtual/telehealth visit, some criteria may be missing or patient reported. Any vitals not documented were not able to be obtained and vitals that have been documented are patient reported.  Patient Medicare AWV questionnaire was completed by the patient on (not done); I have confirmed that all information answered by patient is correct and no changes since this date. Cardiac Risk Factors include: advanced age (>2men, >44 women);dyslipidemia    Objective:    Today's Vitals   02/01/23 0836  Weight: 163 lb (73.9 kg)  Height: 5\' 4"  (1.626 m)   Body mass index is 27.98 kg/m.     02/01/2023    9:00 AM 02/01/2022    9:59 AM 01/27/2021   11:20 AM 11/27/2018    4:42 AM 11/26/2018    3:26 PM 09/13/2018   12:55 PM 03/09/2017    3:06 PM  Advanced Directives  Does Patient Have a Medical Advance Directive? Yes No Yes Yes No Yes Yes  Type of Estate agent of [place];Living will  Healthcare Power of [place];Living will Healthcare Power of [place];Living will  Healthcare Power of [place];Living will Healthcare Power of [place];Living will  Does patient want to make changes to medical advance directive?   Yes (MAU/Ambulatory/Procedural Areas - Information given) No - Patient declined  No - Patient declined   Copy of Healthcare Power of Attorney in Chart? No - copy requested  No - copy requested No - copy requested  No - copy requested  No - copy requested  Would patient like information on creating a medical advance directive?  No - Patient declined         Current Medications (verified) Outpatient Encounter Medications as of 02/01/2023  Medication Sig   AMBULATORY NON FORMULARY MEDICATION Synthroid 88 MCG Compounded Medication use daily before breakfast   Ascorbic Acid (VITAMIN C PO) Take 1,000 mg by mouth daily.   Cholecalciferol (VITAMIN D3 PO) Take 1,000 Units by mouth daily.   clobetasol cream (TEMOVATE) 0.05 % APPLY TO AFFECTED AREA TWICE A DAY   diphenhydrAMINE (BENADRYL) 25 mg capsule Take 25 mg by mouth every 6 (six) hours as needed for itching.   diphenhydrAMINE HCl, Sleep, (UNISOM SLEEPMELTS PO) Take 1 each by mouth 3 times/day as needed-between meals & bedtime (for allergic reaction).   EPINEPHrine 0.3 mg/0.3 mL IJ SOAJ injection Inject 0.3 mg into the muscle as needed for anaphylaxis.   famotidine (PEPCID) 20 MG tablet Take by mouth.   fexofenadine (ALLEGRA) 180 MG tablet Take 180 mg by mouth daily.   ibuprofen (ADVIL) 200 MG tablet Take by mouth.   SYNTHROID 88 MCG tablet TAKE 1 TABLET BY MOUTH EVERY DAY BEFORE BREAKFAST   No facility-administered encounter medications on file as of 02/01/2023.    Allergies (verified) Demerol [meperidine], Meperidine hcl, Diphenhydramine hcl, Doxycycline, Prochlorperazine edisylate, and Alpha-gal   History: Past Medical History:  Diagnosis Date   Allergy to alpha-gal    Anemia    years ago - had hysterectomy to stop - hgb  5.2   Arthritis    hands   Blood transfusion without reported diagnosis    prior to hysterectomy   Cataract    beginnings    Diverticulitis 2018   GERD (gastroesophageal reflux disease)    occ -uses TUMS   Lichen sclerosus    Thyrotoxicosis without mention of goiter or other cause, without mention of thyrotoxic crisis or storm    Unspecified essential hypertension 10/21/2008   Past Surgical History:  Procedure Laterality Date   ABDOMINAL  HYSTERECTOMY  7425,9563   partial, fibroids   BREAST LUMPECTOMY     right,benign   MENISCUS REPAIR Left    Family History  Problem Relation Age of Onset   Goiter Mother    Heart disease Father    Hypothyroidism Sister    Stomach cancer Sister    Colon cancer Maternal Grandmother    Colon polyps Neg Hx    Esophageal cancer Neg Hx    Rectal cancer Neg Hx    Social History   Socioeconomic History   Marital status: Married    Spouse name: [name]   Number of children: Not on file   Years of education: Not on file   Highest education level: Not on file  Occupational History   Not on file  Tobacco Use   Smoking status: Never   Smokeless tobacco: Never  Vaping Use   Vaping status: Never Used  Substance and Sexual Activity   Alcohol use: Not Currently   Drug use: No   Sexual activity: Not Currently  Other Topics Concern   Not on file  Social History Narrative   Lives with husband, [name].    Right Handed   Drinks caffeine rarely.     Social Determinants of Health   Financial Resource Strain: Low Risk  (02/01/2023)   Overall Financial Resource Strain (CARDIA)    Difficulty of Paying Living Expenses: Not hard at all  Food Insecurity: No Food Insecurity (02/01/2023)   Hunger Vital Sign    Worried About Running Out of Food in the Last Year: Never true    Ran Out of Food in the Last Year: Never true  Transportation Needs: No Transportation Needs (02/01/2023)   PRAPARE - Administrator, Civil Service (Medical): No    Lack of Transportation (Non-Medical): No  Physical Activity: Insufficiently Active (02/01/2023)   Exercise Vital Sign    Days of Exercise per Week: 3 days    Minutes of Exercise per Session: 30 min  Stress: No Stress Concern Present (02/01/2023)   [employer] of Occupational Health - Occupational Stress Questionnaire    Feeling of Stress : Only a little  Social Connections: Socially Integrated (02/01/2023)   Social Connection and Isolation  Panel [NHANES]    Frequency of Communication with Friends and Family: More than three times a week    Frequency of Social Gatherings with Friends and Family: Once a week    Attends Religious Services: More than 4 times per year    Active Member of [employer] or Organizations: Yes    Attends Banker Meetings: Never    Marital Status: Married    Tobacco Counseling Counseling given: Not Answered   Clinical Intake:  Pre-visit preparation completed: No  Pain : No/denies pain     BMI - recorded: 27.98 Nutritional Status: BMI 25 -29 Overweight Nutritional Risks: None Diabetes: No  How often do you need to have someone help you when you read instructions, pamphlets, or other written materials  from your doctor or pharmacy?: 1 - Never  Interpreter Needed?: No  Comments: lives with husband Information entered by :: B.Jequan Shahin,LPN   Activities of Daily Living    02/01/2023    9:03 AM  In your present state of health, do you have any difficulty performing the following activities:  Hearing? 1  Vision? 0  Difficulty concentrating or making decisions? 0  Walking or climbing stairs? 0  Dressing or bathing? 0  Doing errands, shopping? 0  Preparing Food and eating ? N  Using the Toilet? N  In the past six months, have you accidently leaked urine? Y  Do you have problems with loss of bowel control? N  Managing your Medications? N  Managing your Finances? N  Housekeeping or managing your Housekeeping? N    Patient Care Team: Hannah Beat, MD as PCP - General (Family Medicine)  Indicate any recent Medical Services you may have received from other than [facility] providers in the past year (date may be approximate).     Assessment:   This is a routine wellness examination for Evaleen.  Hearing/Vision screen Hearing Screening - Comments:: Pt says her hearing is worsening sometimes Vision Screening - Comments:: Pt says she only wears readers;vision is fine Dr Lavell Islam Opthal    Goals Addressed             This Visit's Progress    DIET - EAT MORE FRUITS AND VEGETABLES   On track    Patient Stated   On track    Starting 09/13/2018, I will continue to take medications as prescribed.      COMPLETED: Patient Stated   On track    Maintain drinking water and working in the garden.       Depression Screen    02/01/2023    8:56 AM 06/15/2022    9:54 AM 02/01/2022    9:58 AM 01/27/2021   11:26 AM 09/13/2018   12:55 PM 03/09/2017    3:05 PM 10/05/2016    9:55 AM  PHQ 2/9 Scores  PHQ - 2 Score 0 0 0 0 0 0 0  PHQ- 9 Score   0  0 0     Fall Risk    02/01/2023    8:41 AM 06/15/2022    9:54 AM 02/01/2022   10:00 AM 01/27/2021   11:24 AM 09/13/2018   12:55 PM  Fall Risk   Falls in the past year? 0 0 0 0 0  Number falls in past yr: 0 0 0 0   Injury with Fall? 0 0 0 0   Risk for fall due to : No Fall Risks No Fall Risks No Fall Risks No Fall Risks   Follow up Education provided;Falls prevention discussed Falls evaluation completed Falls prevention discussed;Falls evaluation completed Falls prevention discussed     MEDICARE RISK AT HOME: Medicare Risk at Home Any stairs in or around the home?: Yes If so, are there any without handrails?: Yes Home free of loose throw rugs in walkways, pet beds, electrical cords, etc?: Yes Adequate lighting in your home to reduce risk of falls?: Yes Life alert?: No Use of a cane, walker or w/c?: No Grab bars in the bathroom?: Yes Shower chair or bench in shower?: Yes Elevated toilet seat or a handicapped toilet?: Yes  TIMED UP AND GO:  Was the test performed?  No    Cognitive Function:    09/13/2018   12:40 PM 03/09/2017    3:02 PM  MMSE - Mini Mental State Exam  Orientation to time 5 5  Orientation to Place 5 5  Registration 3 3  Attention/ Calculation 0 0  Recall 3 3  Language- name 2 objects 0 0  Language- repeat 1 1  Language- follow 3 step command 0 3  Language- read & follow direction  0 0  Write a sentence 0 0  Copy design 0 0  Total score 17 20        02/01/2023    9:08 AM 02/01/2022   10:01 AM  6CIT Screen  What Year? 0 points 0 points  What month? 0 points 0 points  What time? 0 points 0 points  Count back from 20 0 points 0 points  Months in reverse 0 points 0 points  Repeat phrase 2 points 0 points  Total Score 2 points 0 points    Immunizations Immunization History  Administered Date(s) Administered   Fluad Quad(high Dose 65+) 01/15/2019, 01/22/2020   Hepatitis A 10/30/2006   Hepatitis B 05/25/2006, 10/30/2006, 10/21/2008   Influenza Split 01/26/2012   Influenza, High Dose Seasonal PF 01/28/2016, 01/17/2017, 02/05/2018   Influenza,inj,Quad PF,6+ Mos 02/07/2014, 03/19/2015   PFIZER(Purple Top)SARS-COV-2 Vaccination 05/10/2019, 06/02/2019, 03/03/2020   Pneumococcal Conjugate-13 06/09/2016   Pneumococcal Polysaccharide-23 06/07/2018   Td 02/21/2006   Tdap 03/03/2016    TDAP status: Up to date  Flu Vaccine status: Due, Education has been provided regarding the importance of this vaccine. Advised may receive this vaccine at local pharmacy or Health Dept. Aware to provide a copy of the vaccination record if obtained from local pharmacy or Health Dept. Verbalized acceptance and understanding.  Pneumococcal vaccine status: Up to date  Covid-19 vaccine status: Completed vaccines  Qualifies for Shingles Vaccine? Yes   Zostavax completed No   Shingrix Completed?: No.    Education has been provided regarding the importance of this vaccine. Patient has been advised to call insurance company to determine out of pocket expense if they have not yet received this vaccine. Advised may also receive vaccine at local pharmacy or Health Dept. Verbalized acceptance and understanding.  Screening Tests Health Maintenance  Topic Date Due   MAMMOGRAM  12/31/2022   COVID-19 Vaccine (4 - 2023-24 season) 02/17/2023 (Originally 11/27/2022)   Zoster Vaccines- Shingrix (1 of  2) 05/04/2023 (Originally 04/01/1969)   INFLUENZA VACCINE  06/26/2023 (Originally 10/27/2022)   Medicare Annual Wellness (AWV)  02/01/2024   Fecal DNA (Cologuard)  02/09/2024   DTaP/Tdap/Td (3 - Td or Tdap) 03/03/2026   Pneumonia Vaccine 64+ Years old  Completed   DEXA SCAN  Completed   Hepatitis C Screening  Completed   HPV VACCINES  Aged Out    Health Maintenance  Health Maintenance Due  Topic Date Due   MAMMOGRAM  12/31/2022    Colorectal cancer screening: Type of screening: Cologuard. Completed 02/08/21. Repeat every 3 years  Mammogram status: Ordered yes. Pt provided with contact info and advised to call to schedule appt.   Bone Density status: Completed 07/08/21. Results reflect: Bone density results: NORMAL. Repeat every 5 years.  Lung Cancer Screening: (Low Dose CT Chest recommended if Age 79-80 years, 20 pack-year currently smoking OR have quit w/in 15years.) does not qualify.   Lung Cancer Screening Referral: no  Additional Screening:  Hepatitis C Screening: does not qualify; Completed 09/14/22  Vision Screening: Recommended annual ophthalmology exams for early detection of glaucoma and other disorders of the eye. Is the patient up to date with their annual eye exam?  Yes  Who is the provider or what is the name of the office in which the patient attends annual eye exams? Dr Randon Goldsmith If pt is not established with a provider, would they like to be referred to a provider to establish care? No .   Dental Screening: Recommended annual dental exams for proper oral hygiene  Diabetic Foot Exam: n/a  Community Resource Referral / Chronic Care Management: CRR required this visit?  No   CCM required this visit?  No    Plan:     I have personally reviewed and noted the following in the patient's chart:   Medical and social history Use of alcohol, tobacco or illicit drugs  Current medications and supplements including opioid prescriptions. Patient is not currently taking  opioid prescriptions. Functional ability and status Nutritional status Physical activity Advanced directives List of other physicians Hospitalizations, surgeries, and ER visits in previous 12 months Vitals Screenings to include cognitive, depression, and falls Referrals and appointments  In addition, I have reviewed and discussed with patient certain preventive protocols, quality metrics, and best practice recommendations. A written personalized care plan for preventive services as well as general preventive health recommendations were provided to patient.    Sue Lush, LPN   16/03/958   After Visit Summary: (MyChart) Due to this being a telephonic visit, the after visit summary with patients personalized plan was offered to patient via MyChart   Nurse Notes: The patient states she is doing well and has no concerns or questions at this time.  **MMG order placed.

## 2023-02-01 NOTE — Patient Instructions (Signed)
Melissa Cox , Thank you for taking time to come for your Medicare Wellness Visit. I appreciate your ongoing commitment to your health goals. Please review the following plan we discussed and let me know if I can assist you in the future.   Referrals/Orders/Follow-Ups/Clinician Recommendations:   You have an order for:  []   2D Mammogram  [x]   3D Mammogram  []   Bone Density     Please call for appointment:  [facility] [address] [place], [place] [address] [phone]  [facility] [address] [place], [place] [address] [phone]  [facility] [address] [place], [place] [address] [phone]  [facility] [place] [facility] [address] [place], [place] [address] [phone]  [facility] [address] [place], [place] [address] [phone]    Make sure to wear two-piece clothing.  No lotions, powders, or deodorants the day of the appointment. Make sure to bring picture ID and insurance card.  Bring list of medications you are currently taking including any supplements.   Schedule your [place] screening mammogram through MyChart!   Log into your MyChart account.  Go to 'Visit' (or 'Appointments' if on mobile App) --> Schedule an Appointment  Under 'Select a Reason for Visit' choose the Mammogram Screening option.  Complete the pre-visit questions and select the time and place that best fits your schedule.    This is a list of the screening recommended for you and due dates:  Health Maintenance  Topic Date Due   Mammogram  12/31/2022   COVID-19 Vaccine (4 - 2023-24 season) 02/17/2023*   Zoster (Shingles) Vaccine (1 of 2) 05/04/2023*   Flu Shot  06/26/2023*   Medicare Annual Wellness Visit  02/01/2024   Cologuard (Stool DNA test)  02/09/2024   DTaP/Tdap/Td vaccine (3 - Td or Tdap) 03/03/2026   Pneumonia Vaccine  Completed    DEXA scan (bone density measurement)  Completed   Hepatitis C Screening  Completed   HPV Vaccine  Aged Out  *Topic was postponed. The date shown is not the original due date.    Advanced directives: (Copy Requested) Please bring a copy of your health care power of attorney and living will to the office to be added to your chart at your convenience.  Next Medicare Annual Wellness Visit scheduled for next year: Yes 02/02/24 @ 10:10am telephone

## 2023-02-09 IMAGING — MG MM DIGITAL SCREENING BILAT W/ TOMO AND CAD
8 series · 8 of 24 positions shown · non-contrast
Comparison: Previous exam(s).

CLINICAL DATA: Screening.

EXAM:
DIGITAL SCREENING BILATERAL MAMMOGRAM WITH TOMOSYNTHESIS AND CAD
TECHNIQUE: Bilateral screening digital craniocaudal and mediolateral oblique
mammograms were obtained. Bilateral screening digital breast
tomosynthesis was performed. The images were evaluated with
computer-aided detection.

[R MLO synth-2D]
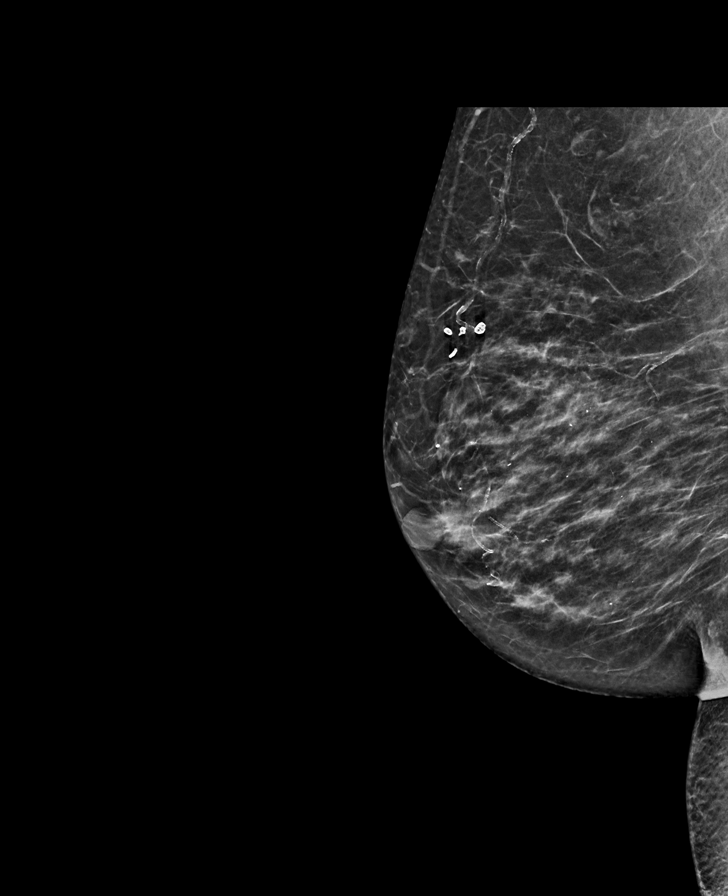

[L CC synth-2D]
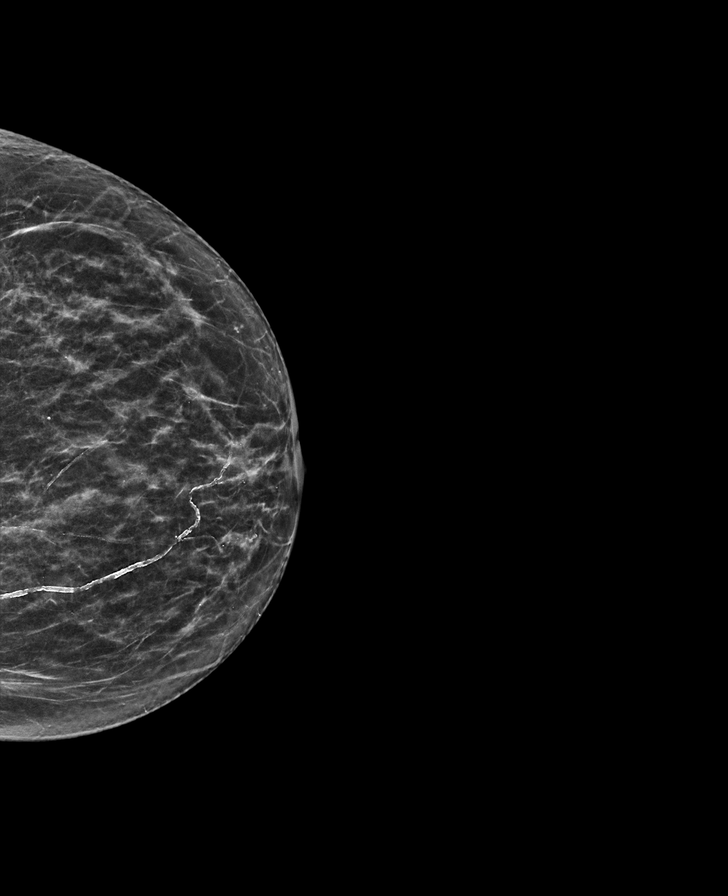

[L MLO synth-2D]
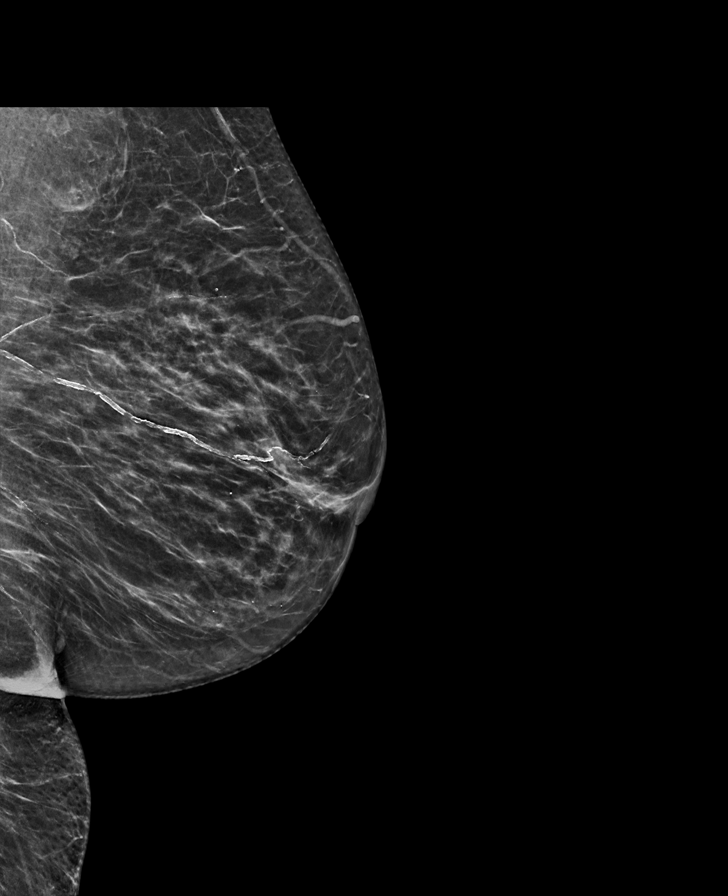

[R CC synth-2D]
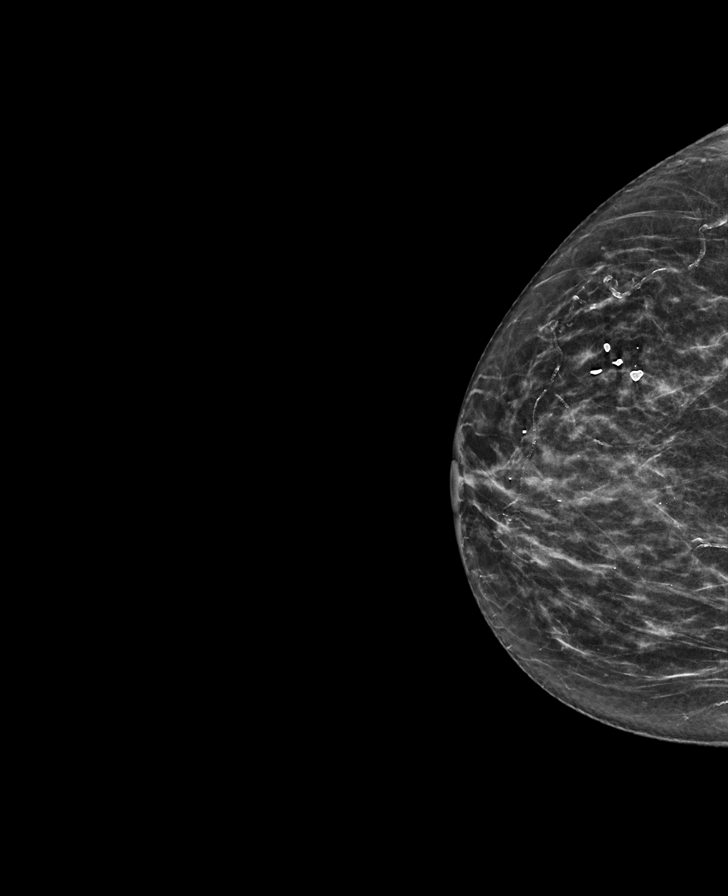

[L MLO tomo · tomo slice 31/61.0]
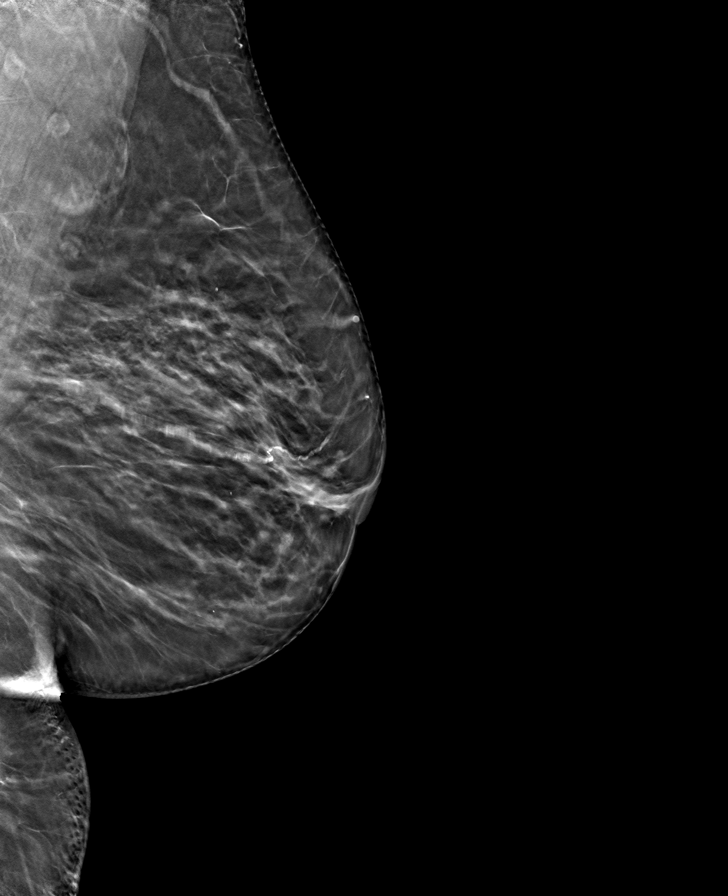

[R MLO tomo · tomo slice 30/59.0]
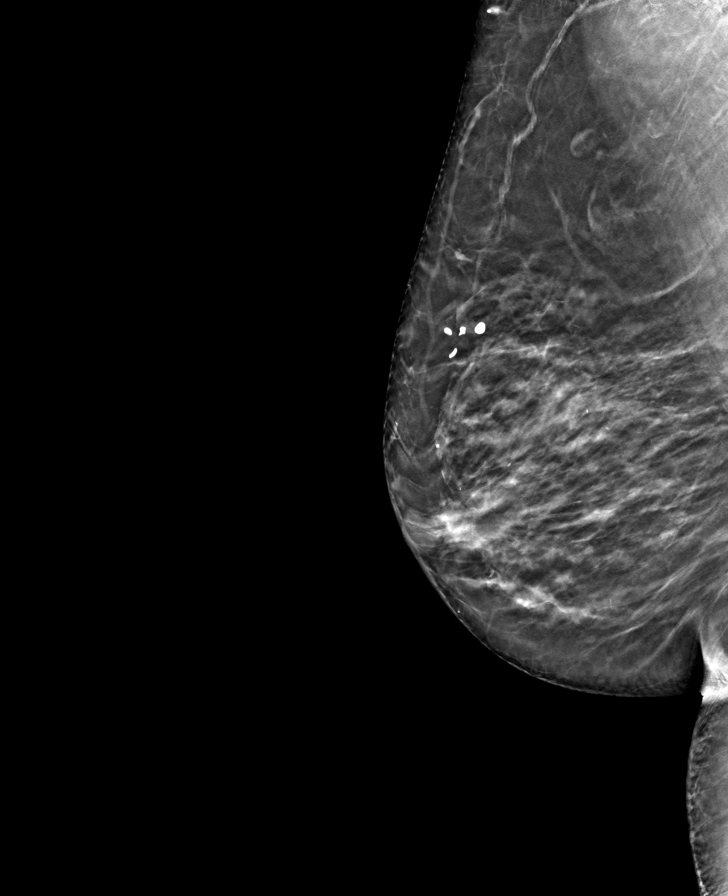

[L CC tomo · tomo slice 27/53.0]
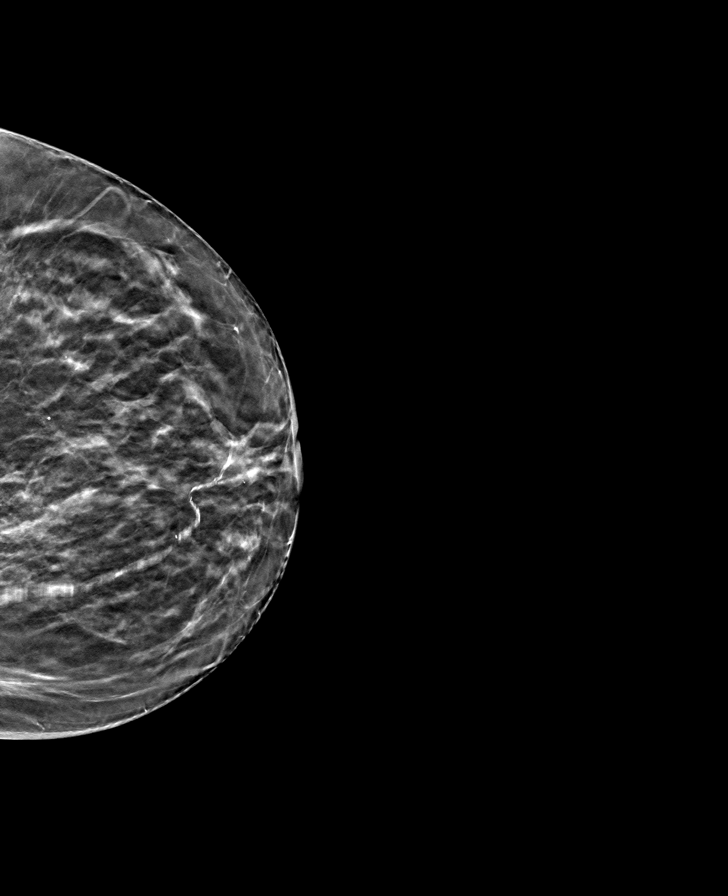

[R CC tomo · tomo slice 27/53.0]
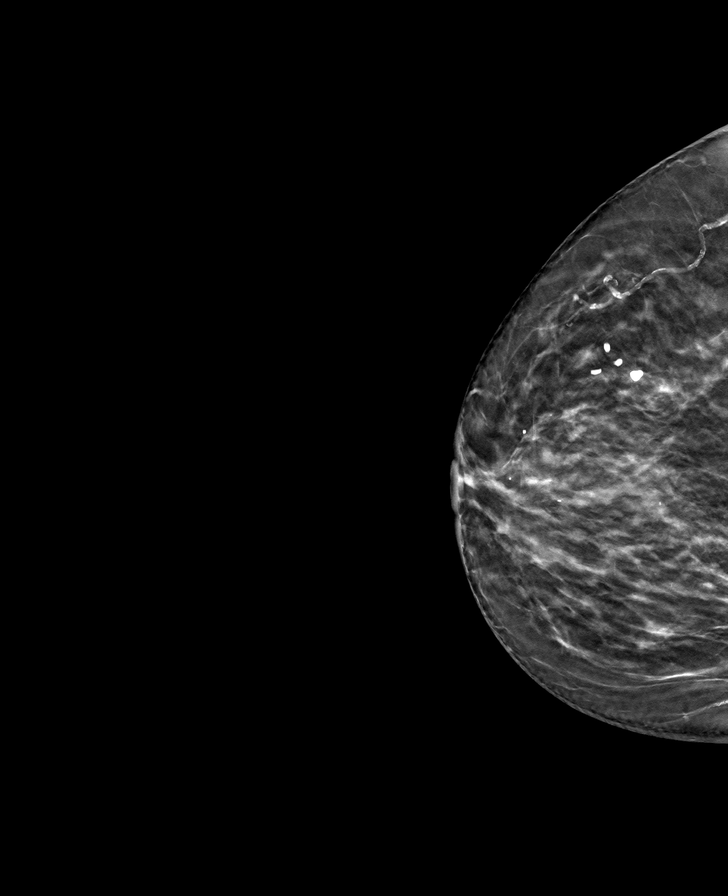

[8 of 24 positions shown; findings below may reference images not displayed]

ACR Breast Density Category c: The breast tissue is heterogeneously
dense, which may obscure small masses.
FINDINGS: There are no findings suspicious for malignancy.
IMPRESSION: No mammographic evidence of malignancy. A result letter of this
screening mammogram will be mailed directly to the patient.

RECOMMENDATION:
Screening mammogram in one year. (Code:Q3-W-BC3)

BI-RADS CATEGORY  1: Negative.

## 2023-02-22 ENCOUNTER — Other Ambulatory Visit: Payer: Self-pay

## 2023-02-27 ENCOUNTER — Other Ambulatory Visit: Payer: PPO

## 2023-02-27 ENCOUNTER — Other Ambulatory Visit: Payer: Self-pay

## 2023-02-27 DIAGNOSIS — E890 Postprocedural hypothyroidism: Secondary | ICD-10-CM

## 2023-02-28 LAB — T4, FREE: Free T4: 1 ng/dL (ref 0.8–1.8)

## 2023-02-28 LAB — TSH: TSH: 6.72 m[IU]/L — ABNORMAL HIGH (ref 0.40–4.50)

## 2023-03-02 ENCOUNTER — Ambulatory Visit: Payer: PPO | Admitting: Endocrinology

## 2023-03-03 ENCOUNTER — Ambulatory Visit: Payer: PPO | Admitting: Endocrinology

## 2023-03-03 ENCOUNTER — Encounter: Payer: Self-pay | Admitting: Endocrinology

## 2023-03-03 ENCOUNTER — Other Ambulatory Visit: Payer: Self-pay

## 2023-03-03 DIAGNOSIS — E890 Postprocedural hypothyroidism: Secondary | ICD-10-CM | POA: Diagnosis not present

## 2023-03-03 MED ORDER — LEVOTHYROXINE SODIUM 100 MCG PO TABS: 100.0000 ug | ORAL_TABLET | Freq: Every day | ORAL | 3 refills | Status: DC

## 2023-03-03 NOTE — Progress Notes (Signed)
Outpatient Endocrinology Note [place] Kamarie Palma, MD  03/03/23  Patient's Name: Melissa Cox    DOB: 12/15/1950    MRN: [id number]  REASON OF VISIT: Follow-up for hypothyroidism  REFERRING PROVIDER:   PCP: Hannah Beat, MD  HISTORY OF PRESENT ILLNESS:   Melissa Cox is a 72 y.o. old female with past medical history as listed below is presented for a follow up for postablative hypothyroidism.   Pertinent Thyroid History: Patient was previously seen by Dr. Lucianne Muss and was last time seen in August, 2024. 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  She has been on levothyroxine since her diagnosis, dose had been changed periodically in the past.  She had taken Synthroid brand name and also levothyroxine generic in the past.  In June 2024, she reported that she was having episodes of nausea, shakiness and dizziness and when she found that symptoms occurred after taking levothyroxine early in the morning she associated this with her alpha gal sensitivity.  She has been since then, taking a custom [facility]. Since she was switched over to the new progression she has not had any of these reactions.  In August 2024 she had elevated TSH of 6.27, patient was asked to take half tablet additional twice a week however she has been taking 1 tablet daily due to no updated prescription and not able to get extra medication.  She has been taking levothyroxine/Synthroid 88 mcg daily.   Interval history Patient has been taking levothyroxine 88 mcg daily.  Reports compliance with taking in the early morning before breakfast and has not been taking any supplement/multivitamins.  She has no new symptoms.  She has dry skin.  Overall feeling okay.  Recent lab with mildly elevated TSH of 6.72 as follows.   Latest Reference Range & Units 10/27/22 09:09 02/27/23 11:13  TSH  0.40 - 4.50 mIU/L 6.27 (H) 6.72 (H)  T4,Free(Direct) 0.8 - 1.8 ng/dL 4.09 1.0  (H): Data is abnormally high   REVIEW OF SYSTEMS:  As per history of present illness.   PAST MEDICAL HISTORY: Past Medical History:  Diagnosis Date   Allergy to alpha-gal    Anemia    years ago - had hysterectomy to stop - hgb 5.2   Arthritis    hands   Blood transfusion without reported diagnosis    prior to hysterectomy   Cataract    beginnings    Diverticulitis 2018   GERD (gastroesophageal reflux disease)    occ -uses TUMS   Lichen sclerosus    Thyrotoxicosis without mention of goiter or other cause, without mention of thyrotoxic crisis or storm    Unspecified essential hypertension 10/21/2008    PAST SURGICAL HISTORY: Past Surgical History:  Procedure Laterality Date   ABDOMINAL HYSTERECTOMY  8119,1478   partial, fibroids   BREAST LUMPECTOMY     right,benign   MENISCUS REPAIR Left     ALLERGIES: Allergies  Allergen Reactions   Demerol [Meperidine] Rash   Meperidine Hcl     REACTION: rash   Diphenhydramine Hcl     REACTION: abd cramps and diarrhea   Doxycycline Nausea Only   Prochlorperazine Edisylate     REACTION: increased heart rate- compazine    Alpha-Gal Diarrhea and Nausea And Vomiting    Patient states throat tingling reaction, lump in throat and shaking.Any medications or substance containing mammal derived ingredients.    FAMILY HISTORY:  Family History  Problem Relation Age of Onset   Goiter Mother    Heart disease Father    Hypothyroidism Sister    Stomach cancer Sister    Colon cancer Maternal Grandmother    Colon polyps Neg Hx    Esophageal cancer Neg Hx    Rectal cancer Neg Hx     SOCIAL HISTORY: Social History   Socioeconomic History   Marital status: Married    Spouse name: [name]   Number of children: Not on file   Years of education: Not on file   Highest education level: Not on file  Occupational History   Not on file  Tobacco Use    Smoking status: Never   Smokeless tobacco: Never  Vaping Use   Vaping status: Never Used  Substance and Sexual Activity   Alcohol use: Not Currently   Drug use: No   Sexual activity: Not Currently  Other Topics Concern   Not on file  Social History Narrative   Lives with husband, [name].    Right Handed   Drinks caffeine rarely.     Social Determinants of Health   Financial Resource Strain: Low Risk  (02/01/2023)   Overall Financial Resource Strain (CARDIA)    Difficulty of Paying Living Expenses: Not hard at all  Food Insecurity: No Food Insecurity (02/01/2023)   Hunger Vital Sign    Worried About Running Out of Food in the Last Year: Never true    Ran Out of Food in the Last Year: Never true  Transportation Needs: No Transportation Needs (02/01/2023)   PRAPARE - Administrator, Civil Service (Medical): No    Lack of Transportation (Non-Medical): No  Physical Activity: Insufficiently Active (02/01/2023)   Exercise Vital Sign    Days of Exercise per Week: 3 days    Minutes of Exercise per Session: 30 min  Stress: No Stress Concern Present (02/01/2023)   [employer] of Occupational Health - Occupational Stress Questionnaire    Feeling of Stress : Only a little  Social Connections: Socially Integrated (02/01/2023)   Social Connection and Isolation Panel [NHANES]    Frequency of Communication with Friends and Family: More than three times a week    Frequency of Social Gatherings with Friends and Family: Once a week    Attends Religious Services: More than 4 times per year    Active Member of [employer] or Organizations: Yes    Attends Banker Meetings: Never    Marital Status: Married    MEDICATIONS:  Current Outpatient Medications  Medication Sig Dispense Refill   AMBULATORY NON FORMULARY MEDICATION Synthroid 88 MCG Compounded Medication use daily before breakfast 30 Dose 3   Ascorbic Acid (VITAMIN C PO) Take 1,000 mg by mouth daily.      Cholecalciferol (VITAMIN D3 PO) Take 1,000 Units by mouth daily.     clobetasol cream (TEMOVATE) 0.05 % APPLY TO AFFECTED AREA TWICE A DAY 30 g 1   diphenhydrAMINE (BENADRYL) 25 mg capsule Take 25 mg by mouth every 6 (six) hours as needed for itching.     diphenhydrAMINE HCl, Sleep, (UNISOM SLEEPMELTS PO) Take 1 each by mouth 3 times/day as needed-between meals & bedtime (for allergic reaction).     EPINEPHrine 0.3 mg/0.3 mL IJ SOAJ injection Inject 0.3 mg into the muscle as needed for anaphylaxis. 1 each 3   famotidine (PEPCID) 20 MG tablet Take by mouth.     fexofenadine (ALLEGRA) 180 MG tablet Take 180 mg by  mouth daily.     ibuprofen (ADVIL) 200 MG tablet Take by mouth.     levothyroxine (SYNTHROID) 100 MCG tablet Take 1 tablet (100 mcg total) by mouth daily before breakfast. 90 tablet 3   No current facility-administered medications for this visit.    PHYSICAL EXAM: Vitals:   03/03/23 1052  BP: 118/80  Pulse: 65  Resp: 20  SpO2: 97%  Weight: 170 lb 6.4 oz (77.3 kg)  Height: 5\' 4"  (1.626 m)   Body mass index is 29.25 kg/m.  Wt Readings from Last 3 Encounters:  03/03/23 170 lb 6.4 oz (77.3 kg)  02/01/23 163 lb (73.9 kg)  10/27/22 163 lb (73.9 kg)    General: Well developed, well nourished female in no apparent distress.  HEENT: AT/[place], no external lesions. Hearing intact to the spoken word Eyes: EOMI. No stare, proptosis or lid lag. Conjunctiva clear and no icterus. Neck: Trachea midline, neck supple without appreciable thyromegaly or lymphadenopathy and no palpable thyroid nodules Lungs: Clear to auscultation, no wheeze. Respirations not labored Heart: S1S2, Regular in rate and rhythm.  Abdomen: Soft, non tender, non distended Neurologic: Alert, oriented, normal speech, deep tendon biceps reflexes normal,  no gross focal neurological deficit Extremities: trace pedal pitting edema, no tremors of outstretched hands Skin: Warm, color good. Dry skin + Psychiatric: Does not  appear depressed or anxious  PERTINENT HISTORIC LABORATORY AND IMAGING STUDIES:  All pertinent laboratory results were reviewed. Please see HPI also for further details.   TSH  Date Value Ref Range Status  02/27/2023 6.72 (H) 0.40 - 4.50 mIU/L Final  10/27/2022 6.27 (H) 0.35 - 5.50 uIU/mL Final  08/03/2022 9.27 (H) 0.35 - 5.50 uIU/mL Final     ASSESSMENT / PLAN  1. 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  She has been on thyroid hormone replacement. -Due to alpha gal allergy she has been taking custom prepared [facility] since June 2024.  Currently taking levothyroxine 88 mcg daily.  Recent TSH is elevated.  Plan: -Increase levothyroxine to 100 mcg daily. -Check thyroid function test in 2 months prior to follow-up visit.   Diagnoses and all orders for this visit:  Hypothyroidism following radioiodine therapy -     T4, free -     TSH  Other orders -     levothyroxine (SYNTHROID) 100 MCG tablet; Take 1 tablet (100 mcg total) by mouth daily before breakfast.    DISPOSITION Follow up in clinic in 2 months suggested.  All questions answered and patient verbalized understanding of the plan.  [place] Gaile Allmon, MD [facility] Endocrinology [facility] [address] [place], [address] [place], [place] [address] Phone # [phone]  At least part of this note was generated using voice recognition software. Inadvertent word errors may have occurred, which were not recognized during the proofreading process.

## 2023-03-03 NOTE — Patient Instructions (Signed)
Increase levothyroxine 100 mcg daily.

## 2023-03-14 ENCOUNTER — Ambulatory Visit
Admission: RE | Admit: 2023-03-14 | Discharge: 2023-03-14 | Disposition: A | Discharge status: Home / Self Care | Payer: PPO | Source: Ambulatory Visit | Attending: Family Medicine | Admitting: Family Medicine

## 2023-03-14 DIAGNOSIS — Z1231 Encounter for screening mammogram for malignant neoplasm of breast: Secondary | ICD-10-CM | POA: Diagnosis not present

## 2023-03-16 ENCOUNTER — Other Ambulatory Visit: Payer: Self-pay | Admitting: Family Medicine

## 2023-03-16 NOTE — Telephone Encounter (Signed)
Last office visit 09/14/2022 for Stage 3 chronic kidney disease, hyperlipidemia, Vit D def and hypothyroidism.  Last refilled 06/15/2022 for #30 g with 1 refill.  Next Appt: No future appointments with PCP.

## 2023-04-27 ENCOUNTER — Other Ambulatory Visit: Payer: Self-pay

## 2023-05-01 ENCOUNTER — Other Ambulatory Visit: Payer: PPO

## 2023-05-01 DIAGNOSIS — E890 Postprocedural hypothyroidism: Secondary | ICD-10-CM | POA: Diagnosis not present

## 2023-05-02 ENCOUNTER — Encounter: Payer: Self-pay | Admitting: Endocrinology

## 2023-05-02 LAB — TSH: TSH: 3.43 m[IU]/L (ref 0.40–4.50)

## 2023-05-02 LAB — T4, FREE: Free T4: 1.4 ng/dL (ref 0.8–1.8)

## 2023-05-04 ENCOUNTER — Encounter: Payer: Self-pay | Admitting: Endocrinology

## 2023-05-04 ENCOUNTER — Other Ambulatory Visit: Payer: Self-pay

## 2023-05-04 ENCOUNTER — Ambulatory Visit: Payer: PPO | Admitting: Endocrinology

## 2023-05-04 VITALS — BP 136/74 | HR 70 | Ht 64.0 in | Wt 170.0 lb

## 2023-05-04 DIAGNOSIS — E890 Postprocedural hypothyroidism: Secondary | ICD-10-CM

## 2023-05-04 MED ORDER — LEVOTHYROXINE SODIUM 100 MCG PO TABS: 100.0000 ug | ORAL_TABLET | Freq: Every day | ORAL | 3 refills | Status: DC

## 2023-05-04 NOTE — Progress Notes (Signed)
 Outpatient Endocrinology Note Pinkie Manger, MD  05/04/23  Patient's Name: Melissa Cox    DOB: 1950/09/14    MRN: [id number]  REASON OF VISIT: Follow-up for hypothyroidism  PCP: Watt Mirza, MD  HISTORY OF PRESENT ILLNESS:   Melissa Cox is a 73 y.o. old female with past medical history as listed below is presented for a follow up for postablative hypothyroidism.   Pertinent Thyroid  History: Patient was previously seen by Dr. Von and was last time seen in August, 2024. 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  She has been on levothyroxine  since her diagnosis, dose had been changed periodically in the past.  She had taken Synthroid  brand name and also levothyroxine  generic in the past.  In June 2024, she reported that she was having episodes of nausea, shakiness and dizziness and when she found that symptoms occurred after taking levothyroxine  early in the morning she associated this with her alpha gal sensitivity.  She has been since then, taking a custom prepared levothyroxine  supplement [facility]. Since she was switched over to the new progression she has not had any of these reactions.  In August 2024 she had elevated TSH of 6.27, patient was asked to take half tablet additional twice a week however she has been taking 1 tablet daily due to no updated prescription and not able to get extra medication.  She has been taking levothyroxine /Synthroid  88 mcg daily.   Interval history Patient has been taking levothyroxine  100 mcg daily.  She denies palpitation or heat intolerance.  No hypo and hyperthyroid symptoms.  Recent thyroid  function test normalized as follows.  No other complaints today.   Latest Reference Range & Units 05/01/23 11:01  TSH 0.40 - 4.50 mIU/L 3.43  T4,Free(Direct) 0.8 - 1.8 ng/dL 1.4   REVIEW OF SYSTEMS:  As per history of present illness.    PAST MEDICAL HISTORY: Past Medical History:  Diagnosis Date   Allergy to alpha-gal    Anemia    years ago - had hysterectomy to stop - hgb 5.2   Arthritis    hands   Blood transfusion without reported diagnosis    prior to hysterectomy   Cataract    beginnings    Diverticulitis 2018   GERD (gastroesophageal reflux disease)    occ -uses TUMS   Lichen sclerosus    Thyrotoxicosis without mention of goiter or other cause, without mention of thyrotoxic crisis or storm    Unspecified essential hypertension 10/21/2008    PAST SURGICAL HISTORY: Past Surgical History:  Procedure Laterality Date   ABDOMINAL HYSTERECTOMY  8012,8011   partial, fibroids   BREAST EXCISIONAL BIOPSY Right    benign   MENISCUS REPAIR Left     ALLERGIES: Allergies  Allergen Reactions   Demerol [Meperidine] Rash   Meperidine Hcl     REACTION: rash   Diphenhydramine Hcl     REACTION: abd cramps and diarrhea   Doxycycline  Nausea Only   Prochlorperazine Edisylate     REACTION: increased heart rate- compazine    Alpha-Gal Diarrhea and Nausea And Vomiting    Patient states throat tingling reaction, lump in throat and shaking.Any medications or substance containing mammal derived ingredients.    FAMILY HISTORY:  Family History  Problem Relation Age of Onset   Goiter Mother    Heart disease Father    Hypothyroidism Sister    Stomach cancer Sister    Colon  cancer Maternal Grandmother    Colon polyps Neg Hx    Esophageal cancer Neg Hx    Rectal cancer Neg Hx    Breast cancer Neg Hx    BRCA 1/2 Neg Hx     SOCIAL HISTORY: Social History   Socioeconomic History   Marital status: Married    Spouse name: [name]   Number of children: Not on file   Years of education: Not on file   Highest education level: Not on file  Occupational History   Not on file  Tobacco Use   Smoking status: Never   Smokeless tobacco: Never  Vaping Use   Vaping status: Never Used  Substance and Sexual Activity    Alcohol use: Not Currently   Drug use: No   Sexual activity: Not Currently  Other Topics Concern   Not on file  Social History Narrative   Lives with husband, [name].    Right Handed   Drinks caffeine rarely.     Social Drivers of Corporate Investment Banker Strain: Low Risk  (02/01/2023)   Overall Financial Resource Strain (CARDIA)    Difficulty of Paying Living Expenses: Not hard at all  Food Insecurity: No Food Insecurity (02/01/2023)   Hunger Vital Sign    Worried About Running Out of Food in the Last Year: Never true    Ran Out of Food in the Last Year: Never true  Transportation Needs: No Transportation Needs (02/01/2023)   PRAPARE - Administrator, Civil Service (Medical): No    Lack of Transportation (Non-Medical): No  Physical Activity: Insufficiently Active (02/01/2023)   Exercise Vital Sign    Days of Exercise per Week: 3 days    Minutes of Exercise per Session: 30 min  Stress: No Stress Concern Present (02/01/2023)   [employer] of Occupational Health - Occupational Stress Questionnaire    Feeling of Stress : Only a little  Social Connections: Socially Integrated (02/01/2023)   Social Connection and Isolation Panel [NHANES]    Frequency of Communication with Friends and Family: More than three times a week    Frequency of Social Gatherings with Friends and Family: Once a week    Attends Religious Services: More than 4 times per year    Active Member of [employer] or Organizations: Yes    Attends Banker Meetings: Never    Marital Status: Married    MEDICATIONS:  Current Outpatient Medications  Medication Sig Dispense Refill   AMBULATORY NON FORMULARY MEDICATION Synthroid  88 MCG Compounded Medication use daily before breakfast 30 Dose 3   Ascorbic Acid (VITAMIN C PO) Take 1,000 mg by mouth daily.     Cholecalciferol (VITAMIN D3 PO) Take 1,000 Units by mouth daily.     clobetasol  cream (TEMOVATE ) 0.05 % APPLY TO AFFECTED AREA TWICE A DAY 30  g 1   diphenhydrAMINE (BENADRYL) 25 mg capsule Take 25 mg by mouth every 6 (six) hours as needed for itching.     diphenhydrAMINE HCl, Sleep, (UNISOM SLEEPMELTS PO) Take 1 each by mouth 3 times/day as needed-between meals & bedtime (for allergic reaction).     EPINEPHrine  0.3 mg/0.3 mL IJ SOAJ injection Inject 0.3 mg into the muscle as needed for anaphylaxis. 1 each 3   famotidine (PEPCID) 20 MG tablet Take by mouth.     fexofenadine (ALLEGRA) 180 MG tablet Take 180 mg by mouth daily.     ibuprofen (ADVIL) 200 MG tablet Take by mouth.     levothyroxine  (  SYNTHROID ) 100 MCG tablet Take 1 tablet (100 mcg total) by mouth daily before breakfast. 90 tablet 3   No current facility-administered medications for this visit.    PHYSICAL EXAM: Vitals:   05/04/23 1113  BP: 136/74  Pulse: 70  SpO2: 98%  Weight: 170 lb (77.1 kg)  Height: 5' 4 (1.626 m)   Body mass index is 29.18 kg/m.  Wt Readings from Last 3 Encounters:  05/04/23 170 lb (77.1 kg)  03/03/23 170 lb 6.4 oz (77.3 kg)  02/01/23 163 lb (73.9 kg)    General: Well developed, well nourished female in no apparent distress.  HEENT: AT/[place], no external lesions. Hearing intact to the spoken word Eyes: EOMI. No stare, proptosis or lid lag. Conjunctiva clear and no icterus. Neck: Trachea midline, neck supple without appreciable thyromegaly or lymphadenopathy and no palpable thyroid  nodules Lungs: Clear to auscultation, no wheeze. Respirations not labored Heart: S1S2, Regular in rate and rhythm.  Abdomen: Soft, non tender, non distended Neurologic: Alert, oriented, normal speech, deep tendon biceps reflexes normal,  no gross focal neurological deficit Extremities: trace pedal pitting edema, no tremors of outstretched hands Skin: Warm, color good.  Psychiatric: Does not appear depressed or anxious  PERTINENT HISTORIC LABORATORY AND IMAGING STUDIES:  All pertinent laboratory results were reviewed. Please see HPI also for further details.    TSH  Date Value Ref Range Status  05/01/2023 3.43 0.40 - 4.50 mIU/L Final  02/27/2023 6.72 (H) 0.40 - 4.50 mIU/L Final  10/27/2022 6.27 (H) 0.35 - 5.50 uIU/mL Final     ASSESSMENT / PLAN  1. Hypothyroidism following radioiodine therapy     -Patient has longstanding history of postablative hypothyroidism since 1977.  She has been on thyroid  hormone replacement. -Due to alpha gal allergy she has been taking custom prepared levothyroxine  supplement [facility] since June 2024.  Currently taking levothyroxine  100 mcg daily.    Plan: -Continue current dose of levothyroxine  100 mg daily. -Check thyroid  function test in 6 months prior to follow-up visit.   Julena was seen today for follow-up.  Diagnoses and all orders for this visit:  Hypothyroidism following radioiodine therapy -     levothyroxine  (SYNTHROID ) 100 MCG tablet; Take 1 tablet (100 mcg total) by mouth daily before breakfast. -     T4, free -     TSH     DISPOSITION Follow up in clinic in 6 months suggested.  All questions answered and patient verbalized understanding of the plan.  Kamry Faraci, MD [facility] Endocrinology [facility] [address] [place], [address] [place], [place] [address] Phone # [phone]  At least part of this note was generated using voice recognition software. Inadvertent word errors may have occurred, which were not recognized during the proofreading process.

## 2023-05-09 ENCOUNTER — Other Ambulatory Visit: Payer: Self-pay

## 2023-05-09 DIAGNOSIS — E890 Postprocedural hypothyroidism: Secondary | ICD-10-CM

## 2023-05-09 MED ORDER — LEVOTHYROXINE SODIUM 100 MCG PO TABS: 100.0000 ug | ORAL_TABLET | Freq: Every day | ORAL | 3 refills | Status: DC

## 2023-07-04 ENCOUNTER — Other Ambulatory Visit: Payer: Self-pay | Admitting: Family Medicine

## 2023-10-30 ENCOUNTER — Other Ambulatory Visit: Payer: PPO

## 2023-10-30 DIAGNOSIS — E890 Postprocedural hypothyroidism: Secondary | ICD-10-CM | POA: Diagnosis not present

## 2023-10-30 LAB — T4, FREE: Free T4: 1.4 ng/dL (ref 0.8–1.8)

## 2023-10-30 LAB — TSH: TSH: 4.61 m[IU]/L — ABNORMAL HIGH (ref 0.40–4.50)

## 2023-10-31 ENCOUNTER — Ambulatory Visit: Payer: Self-pay | Admitting: Endocrinology

## 2023-11-02 ENCOUNTER — Ambulatory Visit: Payer: PPO | Admitting: Endocrinology

## 2023-11-02 ENCOUNTER — Encounter: Payer: Self-pay | Admitting: Endocrinology

## 2023-11-02 VITALS — BP 138/80 | HR 77 | Resp 20 | Ht 64.0 in | Wt 174.0 lb

## 2023-11-02 DIAGNOSIS — E890 Postprocedural hypothyroidism: Secondary | ICD-10-CM

## 2023-11-02 NOTE — Progress Notes (Signed)
 Outpatient Endocrinology Note [place] Zamariya Neal, MD  11/02/23  Patient's Name: Melissa Cox    DOB: May 02, 1950    MRN: [id number]  REASON OF VISIT: Follow-up for hypothyroidism  PCP: Watt Mirza, MD  HISTORY OF PRESENT ILLNESS:   Melissa Cox is a 73 y.o. old female with past medical history as listed below is presented for a follow up for postablative hypothyroidism.   Pertinent Thyroid  History: Patient was previously seen by Dr. Von and was last time seen in August, 2024. 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  She has been on levothyroxine  since her diagnosis, dose had been changed periodically in the past.  She had taken Synthroid  brand name and also levothyroxine  generic in the past.  In June 2024, she reported that she was having episodes of nausea, shakiness and dizziness and when she found that symptoms occurred after taking levothyroxine  early in the morning she associated this with her alpha gal sensitivity.  She has been since then, taking a custom prepared [facility]   Lately she has been taking levothyroxine  compounded 100 mcg daily.    Interval history Patient has been taking levothyroxine  compounded 100 mcg daily.  Denies palpitation and heat intolerance.  No change in bowel habit.  No dry skin.  She gained about 4 pounds of weight since last visit in 6 months.  Fair energy level.  Patient thyroid  function test with mildly elevated TSH and normal free T4 as follows.   Latest Reference Range & Units 10/30/23 10:55  TSH 0.40 - 4.50 mIU/L 4.61 (H)  T4,Free(Direct) 0.8 - 1.8 ng/dL 1.4  (H): Data is abnormally high   REVIEW OF SYSTEMS:  As per history of present illness.   PAST MEDICAL HISTORY: Past Medical History:  Diagnosis Date   Allergy to alpha-gal    Anemia    years ago - had hysterectomy to stop - hgb 5.2    Arthritis    hands   Blood transfusion without reported diagnosis    prior to hysterectomy   Cataract    beginnings    Diverticulitis 2018   GERD (gastroesophageal reflux disease)    occ -uses TUMS   Lichen sclerosus    Thyrotoxicosis without mention of goiter or other cause, without mention of thyrotoxic crisis or storm    Unspecified essential hypertension 10/21/2008    PAST SURGICAL HISTORY: Past Surgical History:  Procedure Laterality Date   ABDOMINAL HYSTERECTOMY  8012,8011   partial, fibroids   BREAST EXCISIONAL BIOPSY Right    benign   MENISCUS REPAIR Left     ALLERGIES: Allergies  Allergen Reactions   Demerol [Meperidine] Rash   Meperidine Hcl     REACTION: rash   Diphenhydramine Hcl     REACTION: abd cramps and diarrhea   Doxycycline  Nausea Only   Prochlorperazine Edisylate     REACTION: increased heart rate- compazine    Alpha-Gal Diarrhea and Nausea And Vomiting    Patient states throat tingling reaction, lump in throat and shaking.Any medications or substance containing mammal derived ingredients.    FAMILY HISTORY:  Family History  Problem Relation Age of Onset   Goiter Mother    Heart disease Father    Hypothyroidism Sister    Stomach cancer Sister    Colon cancer Maternal Grandmother    Colon polyps Neg Hx    Esophageal cancer Neg Hx    Rectal cancer Neg  Hx    Breast cancer Neg Hx    BRCA 1/2 Neg Hx     SOCIAL HISTORY: Social History   Socioeconomic History   Marital status: Married    Spouse name: [name]   Number of children: Not on file   Years of education: Not on file   Highest education level: Not on file  Occupational History   Not on file  Tobacco Use   Smoking status: Never   Smokeless tobacco: Never  Vaping Use   Vaping status: Never Used  Substance and Sexual Activity   Alcohol use: Not Currently   Drug use: No   Sexual activity: Not Currently  Other Topics Concern   Not on file  Social History Narrative   Lives  with husband, [name].    Right Handed   Drinks caffeine rarely.     Social Drivers of Corporate investment banker Strain: Low Risk  (02/01/2023)   Overall Financial Resource Strain (CARDIA)    Difficulty of Paying Living Expenses: Not hard at all  Food Insecurity: No Food Insecurity (02/01/2023)   Hunger Vital Sign    Worried About Running Out of Food in the Last Year: Never true    Ran Out of Food in the Last Year: Never true  Transportation Needs: No Transportation Needs (02/01/2023)   PRAPARE - Administrator, Civil Service (Medical): No    Lack of Transportation (Non-Medical): No  Physical Activity: Insufficiently Active (02/01/2023)   Exercise Vital Sign    Days of Exercise per Week: 3 days    Minutes of Exercise per Session: 30 min  Stress: No Stress Concern Present (02/01/2023)   [employer] of Occupational Health - Occupational Stress Questionnaire    Feeling of Stress : Only a little  Social Connections: Socially Integrated (02/01/2023)   Social Connection and Isolation Panel    Frequency of Communication with Friends and Family: More than three times a week    Frequency of Social Gatherings with Friends and Family: Once a week    Attends Religious Services: More than 4 times per year    Active Member of [employer] or Organizations: Yes    Attends Banker Meetings: Never    Marital Status: Married    MEDICATIONS:  Current Outpatient Medications  Medication Sig Dispense Refill   Ascorbic Acid (VITAMIN C PO) Take 1,000 mg by mouth daily. (Patient taking differently: Take 1,000 mg by mouth daily. As needed)     Cholecalciferol (VITAMIN D3 PO) Take 1,000 Units by mouth daily. (Patient taking differently: Take 1,000 Units by mouth daily. As needed)     clobetasol  cream (TEMOVATE ) 0.05 % APPLY TO AFFECTED AREA TWICE A DAY 30 g 1   diphenhydrAMINE (BENADRYL) 25 mg capsule Take 25 mg by mouth every 6 (six) hours as needed for itching.     diphenhydrAMINE  HCl, Sleep, (UNISOM SLEEPMELTS PO) Take 1 each by mouth 3 times/day as needed-between meals & bedtime (for allergic reaction).     EPINEPHrine  0.3 mg/0.3 mL IJ SOAJ injection INJECT 0.3 MG INTO THE MUSCLE AS NEEDED FOR ANAPHYLAXIS. 2 each 0   famotidine (PEPCID) 20 MG tablet Take by mouth. (Patient taking differently: Take by mouth. As needed)     fexofenadine (ALLEGRA) 180 MG tablet Take 180 mg by mouth daily. (Patient taking differently: Take 180 mg by mouth daily. As needed)     ibuprofen (ADVIL) 200 MG tablet Take by mouth. (Patient taking differently: Take by mouth.  As needed)     levothyroxine  (SYNTHROID ) 100 MCG tablet Take 1 tablet (100 mcg total) by mouth daily before breakfast. 90 tablet 3   AMBULATORY NON FORMULARY MEDICATION Synthroid  88 MCG Compounded Medication use daily before breakfast 30 Dose 3   No current facility-administered medications for this visit.    PHYSICAL EXAM: Vitals:   11/02/23 1058  BP: 138/80  Pulse: 77  Resp: 20  SpO2: 97%  Weight: 174 lb (78.9 kg)  Height: 5' 4 (1.626 m)    Body mass index is 29.87 kg/m.  Wt Readings from Last 3 Encounters:  11/02/23 174 lb (78.9 kg)  05/04/23 170 lb (77.1 kg)  03/03/23 170 lb 6.4 oz (77.3 kg)    General: Well developed, well nourished female in no apparent distress.  HEENT: AT/[place], no external lesions. Hearing intact to the spoken word Eyes: EOMI. No stare, proptosis or lid lag. Conjunctiva clear and no icterus. Neck: Trachea midline, neck supple without appreciable thyromegaly or lymphadenopathy and no palpable thyroid  nodules Lungs: Clear to auscultation, no wheeze. Respirations not labored Heart: S1S2, Regular in rate and rhythm.  Abdomen: Soft, non tender, non distended Neurologic: Alert, oriented, normal speech, deep tendon biceps reflexes normal,  no gross focal neurological deficit Extremities: trace pedal pitting edema, no tremors of outstretched hands Skin: Warm, color good.  Psychiatric: Does not  appear depressed or anxious  PERTINENT HISTORIC LABORATORY AND IMAGING STUDIES:  All pertinent laboratory results were reviewed. Please see HPI also for further details.   TSH  Date Value Ref Range Status  10/30/2023 4.61 (H) 0.40 - 4.50 mIU/L Final  05/01/2023 3.43 0.40 - 4.50 mIU/L Final  02/27/2023 6.72 (H) 0.40 - 4.50 mIU/L Final     ASSESSMENT / PLAN  1. Hypothyroidism following radioiodine therapy     -Patient has longstanding history of postablative hypothyroidism since 1977.  She has been on thyroid  hormone replacement. -Due to alpha gal allergy she has been taking custom prepared levothyrox[facility] since June 2024.  Currently taking levothyroxine  100 mcg daily.    Plan: -Patient TSH mildly elevated.  She is clinically euthyroid in the clinic today.  Will not change the dose of levothyroxine  at this time.  Mild elevation TSH is acceptable. -Continue current dose of levothyroxine  100 mg daily. -Check thyroid  function test in 6 months prior to follow-up visit. - Endocrinology follow-up every 6 months for now.  Diagnoses and all orders for this visit:  Hypothyroidism following radioiodine therapy -     T4, free -     TSH    DISPOSITION Follow up in clinic in 6 months suggested.  All questions answered and patient verbalized understanding of the plan.  [place] Rajni Holsworth, MD [facility] Endocrinology [facility] [address] [place], [address] [place], [place] [address] Phone # [phone]  At least part of this note was generated using voice recognition software. Inadvertent word errors may have occurred, which were not recognized during the proofreading process.

## 2023-11-14 DIAGNOSIS — Z1272 Encounter for screening for malignant neoplasm of vagina: Secondary | ICD-10-CM | POA: Diagnosis not present

## 2023-11-14 DIAGNOSIS — Z124 Encounter for screening for malignant neoplasm of cervix: Secondary | ICD-10-CM | POA: Diagnosis not present

## 2023-11-14 DIAGNOSIS — Z1151 Encounter for screening for human papillomavirus (HPV): Secondary | ICD-10-CM | POA: Diagnosis not present

## 2023-11-14 DIAGNOSIS — Z6830 Body mass index (BMI) 30.0-30.9, adult: Secondary | ICD-10-CM | POA: Diagnosis not present

## 2023-11-14 DIAGNOSIS — N3946 Mixed incontinence: Secondary | ICD-10-CM | POA: Diagnosis not present

## 2023-11-24 DIAGNOSIS — N958 Other specified menopausal and perimenopausal disorders: Secondary | ICD-10-CM | POA: Diagnosis not present

## 2023-11-24 DIAGNOSIS — M8588 Other specified disorders of bone density and structure, other site: Secondary | ICD-10-CM | POA: Diagnosis not present

## 2023-12-04 ENCOUNTER — Encounter: Payer: Self-pay | Admitting: Family Medicine

## 2023-12-04 ENCOUNTER — Ambulatory Visit (INDEPENDENT_AMBULATORY_CARE_PROVIDER_SITE_OTHER): Admitting: Family Medicine

## 2023-12-04 ENCOUNTER — Ambulatory Visit: Payer: Self-pay

## 2023-12-04 VITALS — BP 148/80 | HR 73 | Temp 97.3°F | Ht 64.0 in | Wt 174.0 lb

## 2023-12-04 DIAGNOSIS — R220 Localized swelling, mass and lump, head: Secondary | ICD-10-CM

## 2023-12-04 DIAGNOSIS — Z91038 Other insect allergy status: Secondary | ICD-10-CM | POA: Diagnosis not present

## 2023-12-04 MED ORDER — PREDNISONE 20 MG PO TABS: ORAL_TABLET | ORAL | 0 refills | Status: DC

## 2023-12-04 NOTE — Progress Notes (Signed)
 Melissa Romberger T. Momen Ham, MD, CAQ Sports Medicine [facility] [address] [place], [address]  Phone: [phone]  FAX: [phone]  Melissa Cox - 73 y.o. female  MRN [id number]  Date of Birth: 03/13/1951  Date: 12/04/2023  PCP: Watt Mirza, MD  Referral: Watt Mirza, MD  Chief Complaint  Patient presents with   Insect Bite    Insect bites on face Friday night, woke up Saturday morning with swelling on face. Has insect bites on legs likely happened last Monday evening.    Subjective:   Melissa Cox is a 73 y.o. very pleasant female patient with Body mass index is 29.87 kg/m. who presents with the following:  Discussed the use of AI scribe software for clinical note transcription with the patient, who gave verbal consent to proceed.  History of Present Illness Melissa Cox is a 73 year old female who presents with swelling and itching following an insect bite.  She experienced swelling and itching on her face, particularly around her eyes, after an insect bite on Friday night. By Saturday morning, her eye was significantly swollen. The swelling has persisted, with some reduction around the eyes, but a lump remains. She also noticed small bites on her shoulder, described as 'little, little small, like pencil eraser bites.'  She has a history of occasional similar reactions, though they are not typical for her. She has been using Benadryl to manage the itching, which she finds effective, but has been taking it daily since the incident, more frequently than usual. She also tried a salt water compress on her cheek without relief.  She has tried various insect repellents without success, noting mosquitoes are particularly attracted to her. [place] she believes this contributes to the mosquito presence. She is frequently outdoors due to responsibilities, including mowing several acres of  land, as her husband is on dialysis.  In terms of medication, she has been using Benadryl but is aware of its sedative effects. She has also used Allegra and is considering other antihistamines like Zyrtec or Claritin. She is cautious about medication due to her alpha-gal allergy, which limits her options to certain formulations, such as children's dye-free liquid Benadryl.  She reports no fever. She has not noticed the redness spreading or feeling hot, and currently does not have pain, though she did experience some pain the previous night.    Review of Systems is noted in the HPI, as appropriate  Objective:   BP (!) 148/80   Pulse 73   Temp (!) 97.3 F (36.3 C) (Temporal)   Ht 5' 4 (1.626 m)   Wt 174 lb (78.9 kg)   SpO2 97%   BMI 29.87 kg/m   GEN: No acute distress; alert,appropriate. PULM: Breathing comfortably in no respiratory distress PSYCH: Normally interactive.     NT Not warm  Physical Exam HEENT: Periorbital swelling present. SKIN: Redness present on face.  Laboratory and Imaging Data:  Assessment and Plan:     ICD-10-CM   1. Allergic reaction to insect bite  Z91.038     2. Left facial swelling  R22.0      Assessment & Plan Allergic reaction to insect bite involving face and head Allergic reaction with facial and head swelling and redness, improving around eyes. No cellulitis signs. Differential includes skin infection, but presentation aligns with allergic reaction. - Prescribed prednisone  for inflammation and allergic response. Discussed side effects: increased  blood sugar, irritability, sleep disturbances. - Continue Allegra daily. - Use Benadryl at night for itching as needed; avoid frequent use. Consider Genexa liquid diphenhydramine as an alternative. - Monitor for infection signs: increased redness, heat, pain, spreading. Report if these occur. - Consider 100% DEET insect repellent for future prevention.  Medication Management during today's  office visit: Meds ordered this encounter  Medications   predniSONE  (DELTASONE ) 20 MG tablet    Sig: 2 tabs po for 4 days, then 1 tab po for 4 days    Dispense:  12 tablet    Refill:  0   There are no discontinued medications.  Orders placed today for conditions managed today: No orders of the defined types were placed in this encounter.   Disposition: No follow-ups on file.  Dragon [employer] One speech-to-text software was used for transcription in this dictation.  Possible transcriptional errors can occur using Animal nutritionist.   Signed,  Jacques DASEN. Tyshae Stair, MD   Outpatient Encounter Medications as of 12/04/2023  Medication Sig   Ascorbic Acid (VITAMIN C PO) Take 1,000 mg by mouth daily. (Patient taking differently: Take 1,000 mg by mouth daily. As needed)   Cholecalciferol (VITAMIN D3 PO) Take 1,000 Units by mouth daily. (Patient taking differently: Take 1,000 Units by mouth daily. As needed)   clobetasol  cream (TEMOVATE ) 0.05 % APPLY TO AFFECTED AREA TWICE A DAY   diphenhydrAMINE (BENADRYL) 25 mg capsule Take 25 mg by mouth every 6 (six) hours as needed for itching.   diphenhydrAMINE HCl, Sleep, (UNISOM SLEEPMELTS PO) Take 1 each by mouth 3 times/day as needed-between meals & bedtime (for allergic reaction).   EPINEPHrine  0.3 mg/0.3 mL IJ SOAJ injection INJECT 0.3 MG INTO THE MUSCLE AS NEEDED FOR ANAPHYLAXIS.   famotidine (PEPCID) 20 MG tablet Take by mouth. (Patient taking differently: Take by mouth. As needed)   fexofenadine (ALLEGRA) 180 MG tablet Take 180 mg by mouth daily. (Patient taking differently: Take 180 mg by mouth daily. As needed)   ibuprofen (ADVIL) 200 MG tablet Take by mouth. (Patient taking differently: Take by mouth. As needed)   levothyroxine  (SYNTHROID ) 100 MCG tablet Take 1 tablet (100 mcg total) by mouth daily before breakfast.   predniSONE  (DELTASONE ) 20 MG tablet 2 tabs po for 4 days, then 1 tab po for 4 days   AMBULATORY NON FORMULARY MEDICATION Synthroid   88 MCG Compounded Medication use daily before breakfast (Patient not taking: Reported on 12/04/2023)   No facility-administered encounter medications on file as of 12/04/2023.

## 2023-12-04 NOTE — Patient Instructions (Addendum)
 Allegra over the counter   Benadryl 1-2 tablets at night   [name] 100, 100% Deet

## 2023-12-04 NOTE — Telephone Encounter (Signed)
 Advised to go to ED if develops shortness of breath. Pt verbalized understanding.  FYI Only or Action Required?: FYI only for provider.  Patient was last seen in primary care on 09/14/2022 by Watt Mirza, MD.  Called Nurse Triage reporting Insect Bite (Mosquitos).  Symptoms began several days ago.  Interventions attempted: Ice/heat application.  Symptoms are: gradually worsening.  Triage Disposition: See Physician Within 24 Hours  Patient/caregiver understands and will follow disposition?: Yes Reason for Disposition  Large swelling around mosquito bite (e.g., more than 2 inches or 5 cm wide)  Answer Assessment - Initial Assessment Questions Received mosquito bites to face and cheek on 10/31/23. States there is significant edema to the face. States it looks more like a bee sting would, but is confident they were mosquitos. States she has alpha-gal syndrome, making her more sensitive to insect bites.  1. TYPE of INSECT: What type of insect was it?      Believes they were mosquitos  2. ONSET: When did you get bitten?      10/31/23  3. LOCATION: Where is the insect bite located?      Face  4. REDNESS: Is the area red or pink? If Yes, ask: What size is the area of redness? (inches or cm). When did the redness start?     Yes  5. PAIN: Is there any pain? If Yes, ask: How bad is the pain? (Scale 0-10; or none, mild, moderate, severe)     Denies  6. ITCHING: Does it itch? If Yes, ask: How bad is the itch?      Itchy  7. SWELLING:      Severe  Answer Assessment - Initial Assessment Questions .  Protocols used: Insect Bite-A-AH, Mosquito Bite-A-AH Copied from CRM [id number]. Topic: Clinical - Red Word Triage >> Dec 04, 2023  9:39 AM Wess RAMAN wrote: Red Word that prompted transfer to Nurse Triage: Mosquito bites red and swollen on legs and arms, Itching

## 2024-02-02 ENCOUNTER — Ambulatory Visit: Payer: PPO

## 2024-02-02 VITALS — Ht 64.0 in | Wt 174.0 lb

## 2024-02-02 DIAGNOSIS — Z0000 Encounter for general adult medical examination without abnormal findings: Secondary | ICD-10-CM

## 2024-02-02 DIAGNOSIS — Z1231 Encounter for screening mammogram for malignant neoplasm of breast: Secondary | ICD-10-CM

## 2024-02-02 DIAGNOSIS — H9190 Unspecified hearing loss, unspecified ear: Secondary | ICD-10-CM | POA: Diagnosis not present

## 2024-02-02 NOTE — Patient Instructions (Addendum)
 Ms. Demers,  Thank you for taking the time for your Medicare Wellness Visit. I appreciate your continued commitment to your health goals. Please review the care plan we discussed, and feel free to reach out if I can assist you further.  Please note that Annual Wellness Visits do not include a physical exam. Some assessments may be limited, especially if the visit was conducted virtually. If needed, we may recommend an in-person follow-up with your provider.  Ongoing Care Seeing your primary care provider every 3 to 6 months helps us  monitor your health and provide consistent, personalized care.   Referrals If a referral was made during today's visit and you haven't received any updates within two weeks, please contact the referred provider directly to check on the status.  You have an order for:  []   2D Mammogram  [x]   3D Mammogram  []   Bone Density     Please call for appointment:  [facility] [address] [place], [place] [address] [phone]  [facility] [address] [place], [place] [address] [phone]  [facility] [address] [place], [place] [address] [phone]  [facility] [place] Bone Density [address] [place], [place] [address] [phone]  [facility] [address] [place], [place] [address] [phone]    Make sure to wear two-piece clothing.  No lotions, powders, or deodorants the day of the appointment. Make sure to bring picture ID and insurance card.  Bring list of medications you are currently taking including any supplements.   Recommended Screenings:  Health Maintenance  Topic Date Due   Zoster (Shingles) Vaccine (1 of 2) Never done   COVID-19 Vaccine (4 - 2025-26 season) 11/27/2023   Medicare Annual Wellness Visit  02/01/2024   Flu Shot  06/25/2024*   Cologuard (Stool DNA test)  02/09/2024   Breast Cancer  Screening  03/13/2025   DTaP/Tdap/Td vaccine (3 - Td or Tdap) 03/03/2026   Pneumococcal Vaccine for age over 70  Completed   DEXA scan (bone density measurement)  Completed   Hepatitis C Screening  Completed   Meningitis B Vaccine  Aged Out   Hepatitis B Vaccine  Discontinued  *Topic was postponed. The date shown is not the original due date.       02/02/2024   10:15 AM  Advanced Directives  Does Patient Have a Medical Advance Directive? Yes  Type of Estate Agent of [place];Living will  Copy of Healthcare Power of Attorney in Chart? No - copy requested    Vision: Annual vision screenings are recommended for early detection of glaucoma, cataracts, and diabetic retinopathy. These exams can also reveal signs of chronic conditions such as diabetes and high blood pressure.  Dental: Annual dental screenings help detect early signs of oral cancer, gum disease, and other conditions linked to overall health, including heart disease and diabetes.

## 2024-02-02 NOTE — Progress Notes (Signed)
 Please attest and cosign this visit due to patients primary care provider not being in the office at the time the visit was completed.     Subjective:   Melissa Cox is a 73 y.o. female who presents for a Medicare Annual Wellness Visit.  I connected with  Melissa Cox on 02/02/24 by a audio enabled telemedicine application and verified that I am speaking with the correct person using two identifiers.  Patient Location: Home  Provider Location: Office/Clinic  Persons Participating in Visit: Patient.  I discussed the limitations of evaluation and management by telemedicine. The patient expressed understanding and agreed to proceed.  Vital Signs: Because this visit was a virtual/telehealth visit, some criteria may be missing or patient reported. Any vitals not documented were not able to be obtained and vitals that have been documented are patient reported.   Allergies (verified) Demerol [meperidine], Meperidine hcl, Diphenhydramine hcl, Doxycycline , Prochlorperazine edisylate, and Alpha-gal   History: Past Medical History:  Diagnosis Date   Allergy to alpha-gal    Anemia    years ago - had hysterectomy to stop - hgb 5.2   Arthritis    hands   Blood transfusion without reported diagnosis    prior to hysterectomy   Cataract    beginnings    Diverticulitis 2018   GERD (gastroesophageal reflux disease)    occ -uses TUMS   Lichen sclerosus    Thyrotoxicosis without mention of goiter or other cause, without mention of thyrotoxic crisis or storm    Unspecified essential hypertension 10/21/2008   Past Surgical History:  Procedure Laterality Date   ABDOMINAL HYSTERECTOMY  8012,8011   partial, fibroids   BREAST EXCISIONAL BIOPSY Right    benign   MENISCUS REPAIR Left    Family History  Problem Relation Age of Onset   Goiter Mother    Heart disease Father    Hypothyroidism Sister    Stomach cancer Sister    Colon cancer Maternal Grandmother    Colon polyps Neg Hx     Esophageal cancer Neg Hx    Rectal cancer Neg Hx    Breast cancer Neg Hx    BRCA 1/2 Neg Hx    Social History   Occupational History   Not on file  Tobacco Use   Smoking status: Never   Smokeless tobacco: Never  Vaping Use   Vaping status: Never Used  Substance and Sexual Activity   Alcohol use: Not Currently   Drug use: No   Sexual activity: Not Currently   Tobacco Counseling Counseling given: Not Answered  SDOH Screenings   Food Insecurity: No Food Insecurity (02/02/2024)  Housing: Unknown (02/02/2024)  Transportation Needs: No Transportation Needs (02/02/2024)  Utilities: Not At Risk (02/02/2024)  Alcohol Screen: Low Risk  (02/01/2023)  Depression (PHQ2-9): Low Risk  (02/02/2024)  Financial Resource Strain: Low Risk  (02/01/2023)  Physical Activity: Insufficiently Active (02/02/2024)  Social Connections: Socially Integrated (02/02/2024)  Stress: No Stress Concern Present (02/02/2024)  Tobacco Use: Low Risk  (02/02/2024)  Health Literacy: Adequate Health Literacy (02/02/2024)   Depression Screen    02/02/2024   10:26 AM 02/01/2023    8:56 AM 06/15/2022    9:54 AM 02/01/2022    9:58 AM 01/27/2021   11:26 AM 09/13/2018   12:55 PM 03/09/2017    3:05 PM  PHQ 2/9 Scores  PHQ - 2 Score 0 0 0 0 0 0 0  PHQ- 9 Score    0   0  0  Data saved with a previous flowsheet row definition     Goals Addressed             This Visit's Progress    COMPLETED: DIET - EAT MORE FRUITS AND VEGETABLES       COMPLETED: Patient Stated       Starting 09/13/2018, I will continue to take medications as prescribed.      Patient Stated       I would like to lose a few pounds and be more mobile       Visit info / Clinical Intake: Medicare Wellness Visit Type:: Subsequent Annual Wellness Visit Medicare Wellness Visit Mode:: Telephone If telephone:: video declined If telephone or video:: unable to obtan vitals due to lack of equipment Interpreter Needed?: No Pre-visit prep was completed:  yes AWV questionnaire completed by patient prior to visit?: no Living arrangements:: lives with spouse/significant other Patient's Overall Health Status Rating: good Typical amount of pain: some (discomfort from root cancl) Does pain affect daily life?: no Are you currently prescribed opioids?: no  Dietary Habits and Nutritional Risks How many meals a day?: 3 Eats fruit and vegetables daily?: yes Most meals are obtained by: preparing own meals Diabetic:: no  Functional Status Activities of Daily Living (to include ambulation/medication): Independent Ambulation: Independent Medication Administration: Independent Home Management: Independent Manage your own finances?: yes Primary transportation is: driving Concerns about vision?: no *vision screening is required for WTM* Concerns about hearing?: (!) yes (some loss wouldnt hurt to have hearing aid) Uses hearing aids?: no Hear whispered voice?: (!) no *in-person visit only*  Fall Screening Falls in the past year?: 0 Number of falls in past year: 0 Was there an injury with Fall?: 0 Fall Risk Category Calculator: 0 Patient Fall Risk Level: Low Fall Risk  Fall Risk Patient at Risk for Falls Due to: No Fall Risks Fall risk Follow up: Education provided; Falls prevention discussed  Home and Transportation Safety: All rugs have non-skid backing?: yes (one rug to remove) All stairs or steps have railings?: yes Grab bars in the bathtub or shower?: yes Have non-skid surface in bathtub or shower?: yes Good home lighting?: yes Regular seat belt use?: yes Hospital stays in the last year:: no  Cognitive Assessment Difficulty concentrating, remembering, or making decisions? : no Will 6CIT or Mini Cog be Completed: yes What year is it?: 0 points What month is it?: 0 points Give patient an address phrase to remember (5 components): [address] [place]  About what time is it?: 0 points Count backwards from 20 to 1: 0  points Say the months of the year in reverse: 0 points Repeat the address phrase from earlier: 0 points 6 CIT Score: 0 points  Advance Directives (For Healthcare) Does Patient Have a Medical Advance Directive?: Yes Type of Advance Directive: Healthcare Power of [place]; Living will Copy of Healthcare Power of Attorney in Chart?: No - copy requested Copy of Living Will in Chart?: No - copy requested  Reviewed/Updated  Reviewed/Updated: All        Objective:    Today's Vitals   02/02/24 1013  Weight: 174 lb (78.9 kg)  Height: 5' 4 (1.626 m)   Body mass index is 29.87 kg/m.  Current Medications (verified) Outpatient Encounter Medications as of 02/02/2024  Medication Sig   Ascorbic Acid (VITAMIN C PO) Take 1,000 mg by mouth daily. (Patient taking differently: Take 1,000 mg by mouth daily. As needed)   Cholecalciferol (VITAMIN D3 PO) Take 1,000  Units by mouth daily. (Patient taking differently: Take 1,000 Units by mouth daily. As needed)   clobetasol  cream (TEMOVATE ) 0.05 % APPLY TO AFFECTED AREA TWICE A DAY   diphenhydrAMINE (BENADRYL) 25 mg capsule Take 25 mg by mouth every 6 (six) hours as needed for itching.   diphenhydrAMINE HCl, Sleep, (UNISOM SLEEPMELTS PO) Take 1 each by mouth 3 times/day as needed-between meals & bedtime (for allergic reaction).   EPINEPHrine  0.3 mg/0.3 mL IJ SOAJ injection INJECT 0.3 MG INTO THE MUSCLE AS NEEDED FOR ANAPHYLAXIS.   famotidine (PEPCID) 20 MG tablet Take by mouth. (Patient taking differently: Take by mouth. As needed)   fexofenadine (ALLEGRA) 180 MG tablet Take 180 mg by mouth daily. (Patient taking differently: Take 180 mg by mouth daily. As needed)   ibuprofen (ADVIL) 200 MG tablet Take by mouth. (Patient taking differently: Take by mouth. As needed)   levothyroxine  (SYNTHROID ) 100 MCG tablet Take 1 tablet (100 mcg total) by mouth daily before breakfast.   AMBULATORY NON FORMULARY MEDICATION Synthroid  88 MCG Compounded Medication use  daily before breakfast (Patient not taking: Reported on 02/02/2024)   predniSONE  (DELTASONE ) 20 MG tablet 2 tabs po for 4 days, then 1 tab po for 4 days (Patient not taking: Reported on 02/02/2024)   No facility-administered encounter medications on file as of 02/02/2024.   Hearing/Vision screen No results found. Immunizations and Health Maintenance Health Maintenance  Topic Date Due   Zoster Vaccines- Shingrix (1 of 2) Never done   COVID-19 Vaccine (4 - 2025-26 season) 11/27/2023   Medicare Annual Wellness (AWV)  02/01/2024   Influenza Vaccine  06/25/2024 (Originally 10/27/2023)   Fecal DNA (Cologuard)  02/09/2024   Mammogram  03/13/2025   DTaP/Tdap/Td (3 - Td or Tdap) 03/03/2026   Pneumococcal Vaccine: 50+ Years  Completed   DEXA SCAN  Completed   Hepatitis C Screening  Completed   Meningococcal B Vaccine  Aged Out   Hepatitis B Vaccines 19-59 Average Risk  Discontinued        Assessment/Plan:  This is a routine wellness examination for Karen.  Patient Care Team: Watt Mirza, MD as PCP - General (Family Medicine) Charmayne Molly, MD as Consulting Physician (Ophthalmology)  I have personally reviewed and noted the following in the patient's chart:   Medical and social history Use of alcohol, tobacco or illicit drugs  Current medications and supplements including opioid prescriptions. Functional ability and status Nutritional status Physical activity Advanced directives List of other physicians Hospitalizations, surgeries, and ER visits in previous 12 months Vitals Screenings to include cognitive, depression, and falls Referrals and appointments  No orders of the defined types were placed in this encounter.  In addition, I have reviewed and discussed with patient certain preventive protocols, quality metrics, and best practice recommendations. A written personalized care plan for preventive services as well as general preventive health recommendations were provided to  patient.   Erminio LITTIE Saris, LPN   88/04/7972   No follow-ups on file.  After Visit Summary: (MyChart) Due to this being a telephonic visit, the after visit summary with patients personalized plan was offered to patient via MyChart   Nurse Notes: Pt has no concerns or questions. AWV made for one year

## 2024-02-06 NOTE — Progress Notes (Signed)
 Pt participation with this vist only

## 2024-02-07 ENCOUNTER — Telehealth: Payer: Self-pay | Admitting: *Deleted

## 2024-02-07 DIAGNOSIS — E559 Vitamin D deficiency, unspecified: Secondary | ICD-10-CM

## 2024-02-07 DIAGNOSIS — E782 Mixed hyperlipidemia: Secondary | ICD-10-CM

## 2024-02-07 DIAGNOSIS — Z79899 Other long term (current) drug therapy: Secondary | ICD-10-CM

## 2024-02-07 DIAGNOSIS — R739 Hyperglycemia, unspecified: Secondary | ICD-10-CM

## 2024-02-07 DIAGNOSIS — E890 Postprocedural hypothyroidism: Secondary | ICD-10-CM

## 2024-02-07 NOTE — Telephone Encounter (Signed)
-----   Message from Veva JINNY Ferrari sent at 02/06/2024  2:39 PM EST ----- Regarding: Lab orders for Wed, 11.26.25 Patient is scheduled for CPX labs, please order future labs, Thanks , Veva

## 2024-02-21 ENCOUNTER — Other Ambulatory Visit (INDEPENDENT_AMBULATORY_CARE_PROVIDER_SITE_OTHER)

## 2024-02-21 ENCOUNTER — Other Ambulatory Visit

## 2024-02-21 DIAGNOSIS — R739 Hyperglycemia, unspecified: Secondary | ICD-10-CM | POA: Diagnosis not present

## 2024-02-21 DIAGNOSIS — Z79899 Other long term (current) drug therapy: Secondary | ICD-10-CM | POA: Diagnosis not present

## 2024-02-21 DIAGNOSIS — E782 Mixed hyperlipidemia: Secondary | ICD-10-CM | POA: Diagnosis not present

## 2024-02-21 DIAGNOSIS — E890 Postprocedural hypothyroidism: Secondary | ICD-10-CM | POA: Diagnosis not present

## 2024-02-21 DIAGNOSIS — E559 Vitamin D deficiency, unspecified: Secondary | ICD-10-CM

## 2024-02-21 LAB — HEPATIC FUNCTION PANEL
ALT: 11 U/L (ref 0–35)
AST: 14 U/L (ref 0–37)
Albumin: 4.3 g/dL (ref 3.5–5.2)
Alkaline Phosphatase: 75 U/L (ref 39–117)
Bilirubin, Direct: 0.1 mg/dL (ref 0.0–0.3)
Total Bilirubin: 0.6 mg/dL (ref 0.2–1.2)
Total Protein: 6.8 g/dL (ref 6.0–8.3)

## 2024-02-21 LAB — LIPID PANEL
Cholesterol: 230 mg/dL — ABNORMAL HIGH (ref 0–200)
HDL: 50.6 mg/dL (ref >39.00)
LDL Cholesterol: 132 mg/dL — ABNORMAL HIGH (ref 0–99)
NonHDL: 178.93
Total CHOL/HDL Ratio: 5
Triglycerides: 237 mg/dL — ABNORMAL HIGH (ref 0.0–149.0)
VLDL: 47.4 mg/dL — ABNORMAL HIGH (ref 0.0–40.0)

## 2024-02-21 LAB — TSH: TSH: 4.83 u[IU]/mL (ref 0.35–5.50)

## 2024-02-21 LAB — BASIC METABOLIC PANEL WITH GFR
BUN: 18 mg/dL (ref 6–23)
CO2: 29 meq/L (ref 19–32)
Calcium: 9.5 mg/dL (ref 8.4–10.5)
Chloride: 105 meq/L (ref 96–112)
Creatinine, Ser: 0.86 mg/dL (ref 0.40–1.20)
GFR: 66.8 mL/min (ref >60.00)
Glucose, Bld: 96 mg/dL (ref 70–99)
Potassium: 3.9 meq/L (ref 3.5–5.1)
Sodium: 142 meq/L (ref 135–145)

## 2024-02-21 LAB — CBC WITH DIFFERENTIAL/PLATELET
Basophils Absolute: 0 K/uL (ref 0.0–0.1)
Basophils Relative: 0.6 % (ref 0.0–3.0)
Eosinophils Absolute: 0.3 K/uL (ref 0.0–0.7)
Eosinophils Relative: 3.5 % (ref 0.0–5.0)
HCT: 39.9 % (ref 36.0–46.0)
Hemoglobin: 13.3 g/dL (ref 12.0–15.0)
Lymphocytes Relative: 26.3 % (ref 12.0–46.0)
Lymphs Abs: 2.3 K/uL (ref 0.7–4.0)
MCHC: 33.4 g/dL (ref 30.0–36.0)
MCV: 86.8 fl (ref 78.0–100.0)
Monocytes Absolute: 0.8 K/uL (ref 0.1–1.0)
Monocytes Relative: 8.8 % (ref 3.0–12.0)
Neutro Abs: 5.3 K/uL (ref 1.4–7.7)
Neutrophils Relative %: 60.8 % (ref 43.0–77.0)
Platelets: 258 K/uL (ref 150.0–400.0)
RBC: 4.6 Mil/uL (ref 3.87–5.11)
RDW: 14.1 % (ref 11.5–15.5)
WBC: 8.8 K/uL (ref 4.0–10.5)

## 2024-02-21 LAB — HEMOGLOBIN A1C: Hgb A1c MFr Bld: 5.8 % (ref 4.6–6.5)

## 2024-02-21 LAB — T3, FREE: T3, Free: 3.1 pg/mL (ref 2.3–4.2)

## 2024-02-21 LAB — VITAMIN D 25 HYDROXY (VIT D DEFICIENCY, FRACTURES): VITD: 15.73 ng/mL — ABNORMAL LOW (ref 30.00–100.00)

## 2024-02-21 LAB — T4, FREE: Free T4: 0.83 ng/dL (ref 0.60–1.60)

## 2024-02-27 ENCOUNTER — Encounter: Payer: Self-pay | Admitting: Family Medicine

## 2024-02-27 NOTE — Progress Notes (Deleted)
 [name] T. Zyler Hyson, MD, CAQ Sports Medicine [facility] at [facility] [address] [place], [id number]  Phone: [phone]  FAX: [phone]  [name] - 73 y.o. female  MRN [id number]  Date of Birth: [birth year]  Date: 02/28/2024  PCP: Watt Mirza, MD  Referral: Watt Mirza, MD  No chief complaint on file.  Patient Care Team: Watt Mirza, MD as PCP - General (Family Medicine) Charmayne Molly, MD as Consulting Physician (Ophthalmology) Subjective:   Melissa Cox is a 73 y.o. pleasant patient who presents with the following:  Discussed the use of AI scribe software for clinical note transcription with the patient, who gave verbal consent to proceed.  History of Present Illness     Health Maintenance Summary Reviewed and updated, unless pt declines services.  Tobacco History Reviewed. Non-smoker Alcohol: No concerns, no excessive use Exercise Habits: Some activity, rec at least 30 mins 5 times a week STD concerns: none Drug Use: None Lumps or breast concerns: no  Shingrix Cologuard Flu vaccine RSV COVID booster  History of hypothyroidism and hyperlipidemia. - She is taking levothyroxine  100 mcg a day  Health Maintenance  Topic Date Due   Zoster Vaccines- Shingrix (1 of 2) Never done   COVID-19 Vaccine (4 - 2025-26 season) 11/27/2023   Fecal DNA (Cologuard)  02/09/2024   Influenza Vaccine  06/25/2024 (Originally 10/27/2023)   Medicare Annual Wellness (AWV)  02/01/2025   Mammogram  03/13/2025   DTaP/Tdap/Td (3 - Td or Tdap) 03/03/2026   Pneumococcal Vaccine: 50+ Years  Completed   Bone Density Scan  Completed   Hepatitis C Screening  Completed   Meningococcal B Vaccine  Aged Out   Hepatitis B Vaccines 19-59 Average Risk  Discontinued    Immunization History  Administered Date(s) Administered   Fluad Quad(high Dose 65+) 01/15/2019, 01/22/2020   Hepatitis A 10/30/2006   Hepatitis B 05/25/2006, 10/30/2006,  10/21/2008   INFLUENZA, HIGH DOSE SEASONAL PF 01/28/2016, 01/17/2017, 02/05/2018   Influenza Split 01/26/2012   Influenza,inj,Quad PF,6+ Mos 02/07/2014, 03/19/2015   PFIZER(Purple Top)SARS-COV-2 Vaccination 05/10/2019, 06/02/2019, 03/03/2020   Pneumococcal Conjugate-13 06/09/2016   Pneumococcal Polysaccharide-23 06/07/2018   Td 02/21/2006   Tdap 03/03/2016   Patient Active Problem List   Diagnosis Date Noted   Mixed hyperlipidemia 09/14/2022    Priority: Medium    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    Priority: Medium    CKD (chronic kidney disease) stage 3, GFR 30-59 ml/min (HCC) 09/14/2022   Diverticulitis 11/26/2018   Vitamin D  deficiency 05/17/2013    Past Medical History:  Diagnosis Date   Allergy to alpha-gal    Diverticulitis 2018   GERD (gastroesophageal reflux disease)    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   Lichen sclerosus    Unspecified essential hypertension 10/21/2008    Past Surgical History:  Procedure Laterality Date   ABDOMINAL HYSTERECTOMY  8012,8011   partial, fibroids   BREAST EXCISIONAL BIOPSY Right    benign   MENISCUS REPAIR Left     Family History  Problem Relation Age of Onset   Goiter Mother    Heart disease Father    Hypothyroidism Sister    Stomach cancer Sister    Colon cancer Maternal Grandmother    Colon polyps Neg Hx    Esophageal cancer Neg Hx    Rectal cancer Neg Hx    Breast cancer Neg Hx    BRCA 1/2 Neg Hx     Social History  Social History Narrative   Lives with husband, [name].    Right Handed   Drinks caffeine rarely.      Past Medical History, Surgical History, Social History, Family History, Problem List, Medications, and Allergies have been reviewed and updated if relevant.  Review of Systems: Pertinent positives are listed above.  Otherwise, a full 14 point review of systems has been done in full and it is negative except where it is noted positive.  Objective:    There were no vitals taken for this visit. Ideal Body Weight:   No results found.    02/02/2024   10:26 AM 02/01/2023    8:56 AM 06/15/2022    9:54 AM 02/01/2022    9:58 AM 01/27/2021   11:26 AM  Depression screen PHQ 2/9  Decreased Interest 0 0 0 0 0  Down, Depressed, Hopeless 0 0 0 0 0  PHQ - 2 Score 0 0 0 0 0  Altered sleeping    0   Tired, decreased energy    0   Change in appetite    0   Feeling bad or failure about yourself     0   Trouble concentrating    0   Moving slowly or fidgety/restless    0   Suicidal thoughts    0   PHQ-9 Score    0    Difficult doing work/chores    Not difficult at all      Data saved with a previous flowsheet row definition     GEN: well developed, well nourished, no acute distress Eyes: conjunctiva and lids normal, PERRLA, EOMI ENT: TM clear, nares clear, oral exam WNL Neck: supple, no lymphadenopathy, no thyromegaly, no JVD Pulm: clear to auscultation and percussion, respiratory effort normal CV: regular rate and rhythm, S1-S2, no murmur, rub or gallop, no bruits Chest: no scars, masses, no lumps BREAST: breast exam declined GI: soft, non-tender; no hepatosplenomegaly, masses; active bowel sounds all quadrants GU: GU exam declined Lymph: no cervical, axillary or inguinal adenopathy MSK: gait normal, muscle tone and strength WNL, no joint swelling, effusions, discoloration, crepitus  SKIN: clear, good turgor, color WNL, no rashes, lesions, or ulcerations Neuro: normal mental status, normal strength, sensation, and motion Psych: alert; oriented to person, place and time, normally interactive and not anxious or depressed in appearance.   All labs reviewed with patient. Results for orders placed or performed in visit on 02/21/24  VITAMIN D  25 Hydroxy (Vit-D Deficiency, Fractures)   Collection Time: 02/21/24 11:56 AM  Result Value Ref Range   VITD 15.73 (L) 30.00 - 100.00 ng/mL  T3, free   Collection Time: 02/21/24 11:56 AM  Result  Value Ref Range   T3, Free 3.1 2.3 - 4.2 pg/mL  T4, free   Collection Time: 02/21/24 11:56 AM  Result Value Ref Range   Free T4 0.83 0.60 - 1.60 ng/dL  TSH   Collection Time: 02/21/24 11:56 AM  Result Value Ref Range   TSH 4.83 0.35 - 5.50 uIU/mL  Lipid panel   Collection Time: 02/21/24 11:56 AM  Result Value Ref Range   Cholesterol 230 (H) 0 - 200 mg/dL   Triglycerides 762.9 (H) 0.0 - 149.0 mg/dL   HDL 49.39 >60.99 mg/dL   VLDL 52.5 (H) 0.0 - 59.9 mg/dL   LDL Cholesterol 867 (H) 0 - 99 mg/dL   Total CHOL/HDL Ratio 5    NonHDL 178.93   Hemoglobin A1c   Collection Time: 02/21/24 11:56 AM  Result  Value Ref Range   Hgb A1c MFr Bld 5.8 4.6 - 6.5 %  Hepatic function panel   Collection Time: 02/21/24 11:56 AM  Result Value Ref Range   Total Bilirubin 0.6 0.2 - 1.2 mg/dL   Bilirubin, Direct 0.1 0.0 - 0.3 mg/dL   Alkaline Phosphatase 75 39 - 117 U/L   AST 14 0 - 37 U/L   ALT 11 0 - 35 U/L   Total Protein 6.8 6.0 - 8.3 g/dL   Albumin 4.3 3.5 - 5.2 g/dL  CBC with Differential/Platelet   Collection Time: 02/21/24 11:56 AM  Result Value Ref Range   WBC 8.8 4.0 - 10.5 K/uL   RBC 4.60 3.87 - 5.11 Mil/uL   Hemoglobin 13.3 12.0 - 15.0 g/dL   HCT 60.0 63.9 - 53.9 %   MCV 86.8 78.0 - 100.0 fl   MCHC 33.4 30.0 - 36.0 g/dL   RDW 85.8 88.4 - 84.4 %   Platelets 258.0 150.0 - 400.0 K/uL   Neutrophils Relative % 60.8 43.0 - 77.0 %   Lymphocytes Relative 26.3 12.0 - 46.0 %   Monocytes Relative 8.8 3.0 - 12.0 %   Eosinophils Relative 3.5 0.0 - 5.0 %   Basophils Relative 0.6 0.0 - 3.0 %   Neutro Abs 5.3 1.4 - 7.7 K/uL   Lymphs Abs 2.3 0.7 - 4.0 K/uL   Monocytes Absolute 0.8 0.1 - 1.0 K/uL   Eosinophils Absolute 0.3 0.0 - 0.7 K/uL   Basophils Absolute 0.0 0.0 - 0.1 K/uL  Basic metabolic panel   Collection Time: 02/21/24 11:56 AM  Result Value Ref Range   Sodium 142 135 - 145 mEq/L   Potassium 3.9 3.5 - 5.1 mEq/L   Chloride 105 96 - 112 mEq/L   CO2 29 19 - 32 mEq/L   Glucose, Bld 96  70 - 99 mg/dL   BUN 18 6 - 23 mg/dL   Creatinine, Ser 9.13 0.40 - 1.20 mg/dL   GFR 33.19 >39.99 mL/min   Calcium 9.5 8.4 - 10.5 mg/dL   No results found.  Assessment and Plan:     ICD-10-CM   1. Healthcare maintenance  Z00.00      Assessment & Plan   Health Maintenance Exam: The patient's preventative maintenance and recommended screening tests for an annual wellness exam were reviewed in full today. Brought up to date unless services declined.  Counselled on the importance of diet, exercise, and its role in overall health and mortality. The patient's FH and SH was reviewed, including their home life, tobacco status, and drug and alcohol status.  Follow-up in 1 year for physical exam or additional follow-up below.  Disposition: No follow-ups on file.  Future Appointments  Date Time Provider Department Center  02/28/2024 11:20 AM Watt Mirza, MD [facility] [address]  03/14/2024 11:10 AM [facility] MM 2 [facility]  05/02/2024 10:30 AM [facility]NEURO LAB [facility] None  05/06/2024 10:40 AM Thapa, Sudan, MD [facility] None  02/26/2025  9:15 AM [facility] LAB [facility] [address]  03/05/2025 10:10 AM [facility] ANNUAL WELLNESS VISIT 1 [facility] [address]  03/05/2025 11:20 AM Maelie Chriswell, MD [facility] [address]    No orders of the defined types were placed in this encounter.  There are no discontinued medications. No orders of the defined types were placed in this encounter.   Signed,  Mirza DASEN. Laymond Postle, MD   Allergies as of 02/28/2024       Reactions   Demerol [meperidine] Rash   Meperidine  Hcl    REACTION: rash   Diphenhydramine Hcl    REACTION: abd cramps and diarrhea   Doxycycline  Nausea Only   Prochlorperazine Edisylate    REACTION: increased heart rate- compazine    Alpha-gal Diarrhea, Nausea And Vomiting   Patient states throat tingling reaction, lump in throat and shaking.Any medications or substance containing mammal derived ingredients.         Medication List        Accurate as of February 27, 2024 10:17 AM. If you have any questions, ask your nurse or doctor.          AMBULATORY NON FORMULARY MEDICATION Synthroid  88 MCG Compounded Medication use daily before breakfast   clobetasol  cream 0.05 % Commonly known as: TEMOVATE  APPLY TO AFFECTED AREA TWICE A DAY   diphenhydrAMINE 25 mg capsule Commonly known as: BENADRYL Take 25 mg by mouth every 6 (six) hours as needed for itching.   EPINEPHrine  0.3 mg/0.3 mL Soaj injection Commonly known as: EPI-PEN INJECT 0.3 MG INTO THE MUSCLE AS NEEDED FOR ANAPHYLAXIS.   famotidine 20 MG tablet Commonly known as: PEPCID Take by mouth. What changed: additional instructions   fexofenadine 180 MG tablet Commonly known as: ALLEGRA Take 180 mg by mouth daily. What changed: additional instructions   ibuprofen 200 MG tablet Commonly known as: ADVIL Take by mouth. What changed: additional instructions   levothyroxine  100 MCG tablet Commonly known as: Synthroid  Take 1 tablet (100 mcg total) by mouth daily before breakfast.   predniSONE  20 MG tablet Commonly known as: DELTASONE  2 tabs po for 4 days, then 1 tab po for 4 days   UNISOM SLEEPMELTS PO Take 1 each by mouth 3 times/day as needed-between meals & bedtime (for allergic reaction).   VITAMIN C PO Take 1,000 mg by mouth daily. What changed: additional instructions   VITAMIN D3 PO Take 1,000 Units by mouth daily. What changed: additional instructions

## 2024-02-28 ENCOUNTER — Encounter: Admitting: Family Medicine

## 2024-02-28 DIAGNOSIS — Z0000 Encounter for general adult medical examination without abnormal findings: Secondary | ICD-10-CM

## 2024-03-05 DIAGNOSIS — N3946 Mixed incontinence: Secondary | ICD-10-CM | POA: Diagnosis not present

## 2024-03-05 DIAGNOSIS — R152 Fecal urgency: Secondary | ICD-10-CM | POA: Diagnosis not present

## 2024-03-05 DIAGNOSIS — R159 Full incontinence of feces: Secondary | ICD-10-CM | POA: Diagnosis not present

## 2024-03-05 DIAGNOSIS — N958 Other specified menopausal and perimenopausal disorders: Secondary | ICD-10-CM | POA: Diagnosis not present

## 2024-03-05 DIAGNOSIS — L900 Lichen sclerosus et atrophicus: Secondary | ICD-10-CM | POA: Diagnosis not present

## 2024-03-14 ENCOUNTER — Inpatient Hospital Stay: Admission: RE | Admit: 2024-03-14 | Discharge: 2024-03-14 | Attending: Family Medicine | Admitting: Family Medicine

## 2024-03-14 DIAGNOSIS — Z1231 Encounter for screening mammogram for malignant neoplasm of breast: Secondary | ICD-10-CM

## 2024-03-19 ENCOUNTER — Ambulatory Visit: Payer: Self-pay | Admitting: Family Medicine

## 2024-03-24 NOTE — Progress Notes (Unsigned)
 "    Melissa Kenneth T. Melissa Ferrufino, MD, CAQ Sports Medicine [facility] at [facility] [address] [place], [address]  Phone: [phone]  FAX: [phone]  Melissa Cox - 73 y.o. female  MRN [id number]  Date of Birth: September 24, 1950  Date: 03/25/2024  PCP: Watt Mirza, MD  Referral: Watt Mirza, MD  No chief complaint on file.  Patient Care Team: Watt Mirza, MD as PCP - General (Family Medicine) Charmayne Molly, MD as Consulting Physician (Ophthalmology) Subjective:   Melissa Cox is a 73 y.o. pleasant patient who presents with the following:  Discussed the use of AI scribe software for clinical note transcription with the patient, who gave verbal consent to proceed.  History of Present Illness     Health Maintenance Summary Reviewed and updated, unless pt declines services.  Tobacco History Reviewed. Non-smoker Alcohol: No concerns, no excessive use Exercise Habits: Some activity, rec at least 30 mins 5 times a week STD concerns: none Drug Use: None Lumps or breast concerns: no  Shingrix Cologuard Flu vaccine RSV COVID booster  History of hypothyroidism and hyperlipidemia. - She is taking levothyroxine  100 mcg a day  Health Maintenance  Topic Date Due   Zoster Vaccines- Shingrix (1 of 2) Never done   COVID-19 Vaccine (4 - 2025-26 season) 11/27/2023   Fecal DNA (Cologuard)  02/09/2024   Influenza Vaccine  06/25/2024 (Originally 10/27/2023)   Medicare Annual Wellness (AWV)  02/01/2025   DTaP/Tdap/Td (3 - Td or Tdap) 03/03/2026   Mammogram  03/14/2026   Pneumococcal Vaccine: 50+ Years  Completed   Bone Density Scan  Completed   Hepatitis C Screening  Completed   Meningococcal B Vaccine  Aged Out   Hepatitis B Vaccines 19-59 Average Risk  Discontinued    Immunization History  Administered Date(s) Administered   Fluad Quad(high Dose 65+) 01/15/2019, 01/22/2020   Hepatitis A 10/30/2006   Hepatitis B 05/25/2006, 10/30/2006,  10/21/2008   INFLUENZA, HIGH DOSE SEASONAL PF 01/28/2016, 01/17/2017, 02/05/2018   Influenza Split 01/26/2012   Influenza,inj,Quad PF,6+ Mos 02/07/2014, 03/19/2015   PFIZER(Purple Top)SARS-COV-2 Vaccination 05/10/2019, 06/02/2019, 03/03/2020   Pneumococcal Conjugate-13 06/09/2016   Pneumococcal Polysaccharide-23 06/07/2018   Td 02/21/2006   Tdap 03/03/2016   Patient Active Problem List   Diagnosis Date Noted   Mixed hyperlipidemia 09/14/2022    Priority: Medium    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    Priority: Medium    CKD (chronic kidney disease) stage 3, GFR 30-59 ml/min (HCC) 09/14/2022   Diverticulitis 11/26/2018   Vitamin D  deficiency 05/17/2013    Past Medical History:  Diagnosis Date   Allergy to alpha-gal    Diverticulitis 2018   GERD (gastroesophageal reflux disease)    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   Lichen sclerosus    Unspecified essential hypertension 10/21/2008    Past Surgical History:  Procedure Laterality Date   ABDOMINAL HYSTERECTOMY  8012,8011   partial, fibroids   BREAST EXCISIONAL BIOPSY Right    benign   MENISCUS REPAIR Left     Family History  Problem Relation Age of Onset   Goiter Mother    Heart disease Father    Hypothyroidism Sister    Stomach cancer Sister    Colon cancer Maternal Grandmother    Colon polyps Neg Hx    Esophageal cancer Neg Hx    Rectal cancer Neg Hx    Breast cancer Neg Hx    BRCA 1/2 Neg Hx     Social History  Social History Narrative   Lives with husband, [name].    Right Handed   Drinks caffeine rarely.      Past Medical History, Surgical History, Social History, Family History, Problem List, Medications, and Allergies have been reviewed and updated if relevant.  Review of Systems: Pertinent positives are listed above.  Otherwise, a full 14 point review of systems has been done in full and it is negative except where it is noted positive.  Objective:    There were no vitals taken for this visit. Ideal Body Weight:   No results found.    02/02/2024   10:26 AM 02/01/2023    8:56 AM 06/15/2022    9:54 AM 02/01/2022    9:58 AM 01/27/2021   11:26 AM  Depression screen PHQ 2/9  Decreased Interest 0 0 0 0 0  Down, Depressed, Hopeless 0 0 0 0 0  PHQ - 2 Score 0 0 0 0 0  Altered sleeping    0   Tired, decreased energy    0   Change in appetite    0   Feeling bad or failure about yourself     0   Trouble concentrating    0   Moving slowly or fidgety/restless    0   Suicidal thoughts    0   PHQ-9 Score    0    Difficult doing work/chores    Not difficult at all      Data saved with a previous flowsheet row definition     GEN: well developed, well nourished, no acute distress Eyes: conjunctiva and lids normal, PERRLA, EOMI ENT: TM clear, nares clear, oral exam WNL Neck: supple, no lymphadenopathy, no thyromegaly, no JVD Pulm: clear to auscultation and percussion, respiratory effort normal CV: regular rate and rhythm, S1-S2, no murmur, rub or gallop, no bruits Chest: no scars, masses, no lumps BREAST: breast exam declined GI: soft, non-tender; no hepatosplenomegaly, masses; active bowel sounds all quadrants GU: GU exam declined Lymph: no cervical, axillary or inguinal adenopathy MSK: gait normal, muscle tone and strength WNL, no joint swelling, effusions, discoloration, crepitus  SKIN: clear, good turgor, color WNL, no rashes, lesions, or ulcerations Neuro: normal mental status, normal strength, sensation, and motion Psych: alert; oriented to person, place and time, normally interactive and not anxious or depressed in appearance.   All labs reviewed with patient. Results for orders placed or performed in visit on 02/21/24  VITAMIN D  25 Hydroxy (Vit-D Deficiency, Fractures)   Collection Time: 02/21/24 11:56 AM  Result Value Ref Range   VITD 15.73 (L) 30.00 - 100.00 ng/mL  T3, free   Collection Time: 02/21/24 11:56 AM  Result  Value Ref Range   T3, Free 3.1 2.3 - 4.2 pg/mL  T4, free   Collection Time: 02/21/24 11:56 AM  Result Value Ref Range   Free T4 0.83 0.60 - 1.60 ng/dL  TSH   Collection Time: 02/21/24 11:56 AM  Result Value Ref Range   TSH 4.83 0.35 - 5.50 uIU/mL  Lipid panel   Collection Time: 02/21/24 11:56 AM  Result Value Ref Range   Cholesterol 230 (H) 0 - 200 mg/dL   Triglycerides 762.9 (H) 0.0 - 149.0 mg/dL   HDL 49.39 >60.99 mg/dL   VLDL 52.5 (H) 0.0 - 59.9 mg/dL   LDL Cholesterol 867 (H) 0 - 99 mg/dL   Total CHOL/HDL Ratio 5    NonHDL 178.93   Hemoglobin A1c   Collection Time: 02/21/24 11:56 AM  Result  Value Ref Range   Hgb A1c MFr Bld 5.8 4.6 - 6.5 %  Hepatic function panel   Collection Time: 02/21/24 11:56 AM  Result Value Ref Range   Total Bilirubin 0.6 0.2 - 1.2 mg/dL   Bilirubin, Direct 0.1 0.0 - 0.3 mg/dL   Alkaline Phosphatase 75 39 - 117 U/L   AST 14 0 - 37 U/L   ALT 11 0 - 35 U/L   Total Protein 6.8 6.0 - 8.3 g/dL   Albumin 4.3 3.5 - 5.2 g/dL  CBC with Differential/Platelet   Collection Time: 02/21/24 11:56 AM  Result Value Ref Range   WBC 8.8 4.0 - 10.5 K/uL   RBC 4.60 3.87 - 5.11 Mil/uL   Hemoglobin 13.3 12.0 - 15.0 g/dL   HCT 60.0 63.9 - 53.9 %   MCV 86.8 78.0 - 100.0 fl   MCHC 33.4 30.0 - 36.0 g/dL   RDW 85.8 88.4 - 84.4 %   Platelets 258.0 150.0 - 400.0 K/uL   Neutrophils Relative % 60.8 43.0 - 77.0 %   Lymphocytes Relative 26.3 12.0 - 46.0 %   Monocytes Relative 8.8 3.0 - 12.0 %   Eosinophils Relative 3.5 0.0 - 5.0 %   Basophils Relative 0.6 0.0 - 3.0 %   Neutro Abs 5.3 1.4 - 7.7 K/uL   Lymphs Abs 2.3 0.7 - 4.0 K/uL   Monocytes Absolute 0.8 0.1 - 1.0 K/uL   Eosinophils Absolute 0.3 0.0 - 0.7 K/uL   Basophils Absolute 0.0 0.0 - 0.1 K/uL  Basic metabolic panel   Collection Time: 02/21/24 11:56 AM  Result Value Ref Range   Sodium 142 135 - 145 mEq/L   Potassium 3.9 3.5 - 5.1 mEq/L   Chloride 105 96 - 112 mEq/L   CO2 29 19 - 32 mEq/L   Glucose, Bld 96  70 - 99 mg/dL   BUN 18 6 - 23 mg/dL   Creatinine, Ser 9.13 0.40 - 1.20 mg/dL   GFR 33.19 >39.99 mL/min   Calcium 9.5 8.4 - 10.5 mg/dL   MM 3D SCREENING MAMMOGRAM BILATERAL BREAST Result Date: 03/19/2024 CLINICAL DATA:  Screening. EXAM: DIGITAL SCREENING BILATERAL MAMMOGRAM WITH TOMOSYNTHESIS AND CAD TECHNIQUE: Bilateral screening digital craniocaudal and mediolateral oblique mammograms were obtained. Bilateral screening digital breast tomosynthesis was performed. The images were evaluated with computer-aided detection. COMPARISON:  Previous exam(s). ACR Breast Density Category c: The breasts are heterogeneously dense, which may obscure small masses. FINDINGS: There are no findings suspicious for malignancy. IMPRESSION: No mammographic evidence of malignancy. A result letter of this screening mammogram will be mailed directly to the patient. RECOMMENDATION: Screening mammogram in one year. (Code:SM-B-01Y) BI-RADS CATEGORY  1: Negative. Electronically Signed   By: Curtistine Noble   On: 03/19/2024 05:26    Assessment and Plan:   No diagnosis found.  Assessment & Plan   Health Maintenance Exam: The patient's preventative maintenance and recommended screening tests for an annual wellness exam were reviewed in full today. Brought up to date unless services declined.  Counselled on the importance of diet, exercise, and its role in overall health and mortality. The patient's FH and SH was reviewed, including their home life, tobacco status, and drug and alcohol status.  Follow-up in 1 year for physical exam or additional follow-up below.  Disposition: No follow-ups on file.  Future Appointments  Date Time Provider Department Center  03/25/2024  2:20 PM Watt Mirza, MD [facility] [address]  05/02/2024 10:30 AM [facility]NEURO [facility] None  05/06/2024 10:40  AM Thapa, Sudan, MD [facility] None  02/26/2025  9:15 AM [facility] LAB [facility] [address]  03/05/2025 10:10 AM [facility] ANNUAL WELLNESS  VISIT 1 [facility] [address]  03/05/2025 11:20 AM Anntoinette Haefele, MD [facility] [address]    No orders of the defined types were placed in this encounter.  There are no discontinued medications. No orders of the defined types were placed in this encounter.   Signed,  Jacques DASEN. Karelly Dewalt, MD   Allergies as of 03/25/2024       Reactions   Demerol [meperidine] Rash   Meperidine Hcl    REACTION: rash   Diphenhydramine Hcl    REACTION: abd cramps and diarrhea   Doxycycline  Nausea Only   Prochlorperazine Edisylate    REACTION: increased heart rate- compazine    Alpha-gal Diarrhea, Nausea And Vomiting   Patient states throat tingling reaction, lump in throat and shaking.Any medications or substance containing mammal derived ingredients.        Medication List        Accurate as of March 24, 2024  9:09 AM. If you have any questions, ask your nurse or doctor.          AMBULATORY NON FORMULARY MEDICATION Synthroid  88 MCG Compounded Medication use daily before breakfast   clobetasol  cream 0.05 % Commonly known as: TEMOVATE  APPLY TO AFFECTED AREA TWICE A DAY   EPINEPHrine  0.3 mg/0.3 mL Soaj injection Commonly known as: EPI-PEN INJECT 0.3 MG INTO THE MUSCLE AS NEEDED FOR ANAPHYLAXIS.   fexofenadine 180 MG tablet Commonly known as: ALLEGRA Take 180 mg by mouth daily. What changed: additional instructions   ibuprofen 200 MG tablet Commonly known as: ADVIL Take by mouth. What changed: additional instructions   levothyroxine  100 MCG tablet Commonly known as: Synthroid  Take 1 tablet (100 mcg total) by mouth daily before breakfast.        "

## 2024-03-25 ENCOUNTER — Encounter: Payer: Self-pay | Admitting: Family Medicine

## 2024-03-25 ENCOUNTER — Ambulatory Visit (INDEPENDENT_AMBULATORY_CARE_PROVIDER_SITE_OTHER): Admitting: Family Medicine

## 2024-03-25 VITALS — BP 120/80 | HR 70 | Temp 98.4°F | Ht 63.25 in | Wt 174.2 lb

## 2024-03-25 DIAGNOSIS — Z0000 Encounter for general adult medical examination without abnormal findings: Secondary | ICD-10-CM

## 2024-03-25 DIAGNOSIS — Z1211 Encounter for screening for malignant neoplasm of colon: Secondary | ICD-10-CM

## 2024-03-25 DIAGNOSIS — Z91014 Allergy to mammalian meats: Secondary | ICD-10-CM

## 2024-03-25 DIAGNOSIS — E890 Postprocedural hypothyroidism: Secondary | ICD-10-CM

## 2024-03-25 MED ORDER — AMBULATORY NON FORMULARY MEDICATION: Status: AC

## 2024-03-25 NOTE — Patient Instructions (Signed)
 Shingles Vaccine (Shingrix) and RSV vaccine - what is safe for alpha-gal?

## 2024-03-26 DIAGNOSIS — Z91014 Allergy to mammalian meats: Secondary | ICD-10-CM | POA: Insufficient documentation

## 2024-04-20 LAB — COLOGUARD: COLOGUARD: POSITIVE — AB

## 2024-04-22 ENCOUNTER — Ambulatory Visit: Payer: Self-pay | Admitting: Family Medicine

## 2024-04-22 DIAGNOSIS — R195 Other fecal abnormalities: Secondary | ICD-10-CM

## 2024-04-26 ENCOUNTER — Emergency Department
Admission: EM | Admit: 2024-04-26 | Discharge: 2024-04-26 | Disposition: A | Discharge status: Home / Self Care | Source: Ambulatory Visit | Attending: Emergency Medicine | Admitting: Emergency Medicine

## 2024-04-26 ENCOUNTER — Ambulatory Visit: Admitting: Family Medicine

## 2024-04-26 ENCOUNTER — Ambulatory Visit: Payer: Self-pay

## 2024-04-26 ENCOUNTER — Other Ambulatory Visit: Payer: Self-pay

## 2024-04-26 ENCOUNTER — Emergency Department

## 2024-04-26 ENCOUNTER — Encounter: Payer: Self-pay | Admitting: Family Medicine

## 2024-04-26 VITALS — BP 161/83 | HR 58 | Temp 98.1°F | Resp 16 | Ht 64.0 in | Wt 178.0 lb

## 2024-04-26 DIAGNOSIS — R1010 Upper abdominal pain, unspecified: Secondary | ICD-10-CM

## 2024-04-26 DIAGNOSIS — I700 Atherosclerosis of aorta: Secondary | ICD-10-CM | POA: Insufficient documentation

## 2024-04-26 DIAGNOSIS — R112 Nausea with vomiting, unspecified: Secondary | ICD-10-CM

## 2024-04-26 DIAGNOSIS — N390 Urinary tract infection, site not specified: Secondary | ICD-10-CM | POA: Diagnosis not present

## 2024-04-26 DIAGNOSIS — R1030 Lower abdominal pain, unspecified: Secondary | ICD-10-CM

## 2024-04-26 LAB — CBC
HCT: 42.8 % (ref 36.0–46.0)
Hemoglobin: 14.1 g/dL (ref 12.0–15.0)
MCH: 29.1 pg (ref 26.0–34.0)
MCHC: 32.9 g/dL (ref 30.0–36.0)
MCV: 88.4 fL (ref 80.0–100.0)
Platelets: 319 10*3/uL (ref 150–400)
RBC: 4.84 MIL/uL (ref 3.87–5.11)
RDW: 13.3 % (ref 11.5–15.5)
WBC: 9.8 10*3/uL (ref 4.0–10.5)
nRBC: 0 % (ref 0.0–0.2)

## 2024-04-26 LAB — COMPREHENSIVE METABOLIC PANEL WITH GFR
ALT: 12 U/L (ref 0–44)
AST: 19 U/L (ref 15–41)
Albumin: 4.4 g/dL (ref 3.5–5.0)
Alkaline Phosphatase: 87 U/L (ref 38–126)
Anion gap: 13 (ref 5–15)
BUN: 19 mg/dL (ref 8–23)
CO2: 23 mmol/L (ref 22–32)
Calcium: 9.8 mg/dL (ref 8.9–10.3)
Chloride: 101 mmol/L (ref 98–111)
Creatinine, Ser: 0.88 mg/dL (ref 0.44–1.00)
GFR, Estimated: >60 mL/min
Glucose, Bld: 96 mg/dL (ref 70–99)
Potassium: 4 mmol/L (ref 3.5–5.1)
Sodium: 137 mmol/L (ref 135–145)
Total Bilirubin: 0.4 mg/dL (ref 0.0–1.2)
Total Protein: 7.7 g/dL (ref 6.5–8.1)

## 2024-04-26 LAB — LIPASE, BLOOD: Lipase: 29 U/L (ref 11–51)

## 2024-04-26 LAB — URINALYSIS, ROUTINE W REFLEX MICROSCOPIC
Bacteria, UA: NONE SEEN
Bilirubin Urine: NEGATIVE
Glucose, UA: NEGATIVE mg/dL
Hgb urine dipstick: NEGATIVE
Ketones, ur: 20 mg/dL — AB
Nitrite: NEGATIVE
Protein, ur: 30 mg/dL — AB
Specific Gravity, Urine: 1.028 (ref 1.005–1.030)
WBC, UA: >50 WBC/hpf (ref 0–5)
pH: 5 (ref 5.0–8.0)

## 2024-04-26 MED ORDER — SODIUM CHLORIDE 0.9 % IV SOLN
1.0000 g | Freq: Once | INTRAVENOUS | Status: AC
  Administered 2024-04-26: 1 g via INTRAVENOUS
  Filled 2024-04-26: qty 10

## 2024-04-26 MED ORDER — SODIUM CHLORIDE 0.9 % IV BOLUS
1000.0000 mL | Freq: Once | INTRAVENOUS | Status: AC
  Administered 2024-04-26: 1000 mL via INTRAVENOUS

## 2024-04-26 MED ORDER — CEPHALEXIN 500 MG PO CAPS: 500.0000 mg | ORAL_CAPSULE | Freq: Three times a day (TID) | ORAL | 0 refills | Status: AC

## 2024-04-26 MED ORDER — ONDANSETRON HCL 4 MG/2ML IJ SOLN
4.0000 mg | Freq: Once | INTRAMUSCULAR | Status: AC
  Administered 2024-04-26: 4 mg via INTRAVENOUS
  Filled 2024-04-26: qty 2

## 2024-04-26 MED ORDER — IOHEXOL 300 MG/ML  SOLN
100.0000 mL | Freq: Once | INTRAMUSCULAR | Status: AC | PRN
  Administered 2024-04-26: 100 mL via INTRAVENOUS

## 2024-04-26 NOTE — ED Provider Notes (Signed)
 "  [facility] Provider Note    Event Date/Time   First MD Initiated Contact with Patient 04/26/24 1648     (approximate)   History   Abdominal Pain   HPI  Melissa Cox is a 74 y.o. female who presents to the emergency department today because of concerns for abdominal pain, nausea, decreased appetite.  Patient's symptoms have been going on over the past few days.  She states that her pain has been located in the lower abdomen.  She describes it as sharp.  Does somewhat remind her when she has had diverticulitis in the past.  She denies any diarrhea.  She is concerned that she might be dehydrated.  No fevers although she has had chills.  Denies any known unusual ingestions although that is concern for send states that she has alpha gal. She tried taking her OTC for alpha gal without any relief.      Physical Exam   Triage Vital Signs: ED Triage Vitals  Encounter Vitals Group     BP 04/26/24 1555 (!) 148/92     Girls Systolic BP Percentile --      Girls Diastolic BP Percentile --      Boys Systolic BP Percentile --      Boys Diastolic BP Percentile --      Pulse Rate 04/26/24 1555 74     Resp 04/26/24 1555 14     Temp 04/26/24 1555 98.4 F (36.9 C)     Temp Source 04/26/24 1555 Oral     SpO2 04/26/24 1555 96 %     Weight 04/26/24 1556 170 lb (77.1 kg)     Height 04/26/24 1556 5' 4 (1.626 m)     Head Circumference --      Peak Flow --      Pain Score 04/26/24 1556 0     Pain Loc --      Pain Education --      Exclude from Growth Chart --     Most recent vital signs: Vitals:   04/26/24 1555  BP: (!) 148/92  Pulse: 74  Resp: 14  Temp: 98.4 F (36.9 C)  SpO2: 96%   General: Awake, alert, oriented. CV:  Good peripheral perfusion. Regular rate and rhythm. Resp:  Normal effort. Lungs clear. Abd:  No distention. Tender to palpation in the lower abdomen.    ED Results / Procedures / Treatments   Labs (all labs ordered are listed, but  only abnormal results are displayed) Labs Reviewed  URINALYSIS, ROUTINE W REFLEX MICROSCOPIC - Abnormal; Notable for the following components:      Result Value   Color, Urine YELLOW (*)    APPearance HAZY (*)    Ketones, ur 20 (*)    Protein, ur 30 (*)    Leukocytes,Ua LARGE (*)    All other components within normal limits  LIPASE, BLOOD  COMPREHENSIVE METABOLIC PANEL WITH GFR  CBC     EKG  None   RADIOLOGY I independently interpreted and visualized the CT abd/pel. My interpretation: No diverticulitis Radiology interpretation:  IMPRESSION:  1. 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  2. Moderate-to-severe pancolonic diverticulosis, most severe within the distal  colon, without superimposed acute inflammatory change.  3. Mild hepatic steatosis.  PROCEDURES:  Critical Care performed: No    MEDICATIONS ORDERED IN ED: Medications - No data to display   IMPRESSION / MDM / ASSESSMENT AND PLAN / ED COURSE  I reviewed the triage vital signs and the nursing notes.                              Differential diagnosis includes, but is not limited to, diverticulitis, SBO, UTI, pyelonephritis  Patient's presentation is most consistent with acute presentation with potential threat to life or bodily function.  Patient presented to the emergency department today because of concerns for lower abdominal pain, nausea.  On exam patient is tender in the lower abdomen.  Abdomen is soft.  No rebound.  Given history of diverticulitis a CT scan was obtained.  While this did not show diverticulitis it was concerning for aortic atherosclerotic plaque with ulcer.  Discussed with Dr. Tisa with vascular surgery.  At  this time no stranding or signs of extravasation.  She did they could be reasonable for patient to follow-up in vascular clinic.  Patient's urine is consistent with urinary tract irritation.  I do think this could explain the patient's lower abdominal pain and nausea.  Will give dose of IV antibiotics here.  Will plan on discharging with further antibiotics.     FINAL CLINICAL IMPRESSION(S) / ED DIAGNOSES   Final diagnoses:  Lower abdominal pain  Lower urinary tract infectious disease  Atherosclerotic ulcer of aorta        Rx / DC Orders   ED Discharge Orders     None        Note:  This document was prepared using Dragon voice recognition software and may include unintentional dictation errors.    Floy Roberts, MD 04/26/24 2110  "

## 2024-04-26 NOTE — Telephone Encounter (Signed)
 Next Appt With Family Medicine Ardean Perry, MD) 04/26/2024 at 2:00 PM

## 2024-04-26 NOTE — Progress Notes (Signed)
 "     Established patient visit   Patient: Melissa Cox   DOB: [birth year]   74 y.o. Female  MRN: [id number] Visit Date: 04/26/2024  Today's healthcare provider: Nancyann Perry, MD   Chief Complaint  Patient presents with   Acute Visit   Abdominal Pain    ABD  ( since tues All symptoms)   Subjective    Discussed the use of AI scribe software for clinical note transcription with the patient, who gave verbal consent to proceed.  History of Present Illness   Melissa Cox is a 74 year old female with alpha-gal allergy who presents with nausea and shaking spells.  Symptoms began on Tuesday around 11:00 AM with nausea and severe shaking spells. She associates these symptoms with her known allergic reactions but cannot identify any specific food triggers. She suspects an environmental cause but is uncertain.  She has been taking Benadryl liquid, Zyrtec liquid, and occasionally Unisom sleep pills to manage her symptoms, but these have not provided significant relief. She also has an EpiPen  available if needed.  She experienced severe lower abdominal pain in the first couple of days, describing it as feeling like something was moving inside, possibly gas-related. She initially considered diverticulitis, which she had years ago, but notes the absence of shaking during her previous episodes.  Currently, she continues to pass a lot of gas, experiences stomach 'rolling,' and frequent belching. She was nauseous this morning but did not vomit, although she experienced dry heaving. Her last bowel movement was yesterday morning and was normal.  No fever, chills, or sweats, although the shaking resembles chills. The shaking occurs intermittently and lasted about an hour yesterday.  Her dietary intake today included a boiled egg and a small piece of banana, which she tolerated well. She has consumed a couple of cups of water but feels she is not staying adequately hydrated. She experienced nausea  triggered by the smell of cooking this morning.  She mentions an abnormal Cologuard test result and is awaiting a colonoscopy, which she cannot schedule until her current symptoms resolve.       Medications: Show/hide medication list[1] Review of Systems     Objective    BP (!) 161/83 (BP Location: Right Arm, Patient Position: Sitting, Cuff Size: Normal)   Pulse (!) 58   Temp 98.1 F (36.7 C) (Oral)   Resp 16   Ht 5' 4 (1.626 m)   Wt 178 lb (80.7 kg)   SpO2 99%   BMI 30.55 kg/m   Physical Exam   General Appearance:    Mildly obese female, alert, cooperative, in no acute distress  HEENT:   NP/OP pink and clear, dry mucous membranes  Eyes:    PERRL, conjunctiva/corneas clear, EOM's intact       Lungs:     Clear to auscultation bilaterally, respirations unlabored  Heart:    Bradycardic. Normal rhythm. No murmurs, rubs, or gallops.    Abdomen:   Tender upper abdomen, more so RUQ. Hypoactive bowel sounds, mildly distended. No CVA tenderness     Assessment & Plan        Acute postprandial abdominal pain, associated with dark colored stool and recently positive Cologuard test, also poor oral intake with signs of mild dehydration.  Differential diagnosis includes SBO, GI bleed, gallbladder disease and infectious GI disease.  Need urgent labs and imaging not available in outpatient setting today.   Alpha-gal allergy Symptoms not clearly related to alpha-gal allergy due to inadequate  response to medications.         Nancyann Perry, MD  [facility] [phone] (phone) [phone] (fax)  [facility]    [1]  Outpatient Medications Prior to Visit  Medication Sig   AMBULATORY NON FORMULARY MEDICATION Synthroid  100 MCG Compounded Medication use daily before breakfast   cetirizine HCl (ZYRTEC CHILDRENS ALLERGY) 5 MG/5ML SOLN Take 5 mg by mouth daily as needed for allergies.   Cholecalciferol (D3 5000) 125 MCG (5000 UT) capsule Take  5,000 Units by mouth daily.   clobetasol  cream (TEMOVATE ) 0.05 % APPLY TO AFFECTED AREA TWICE A DAY   EPINEPHrine  0.3 mg/0.3 mL IJ SOAJ injection INJECT 0.3 MG INTO THE MUSCLE AS NEEDED FOR ANAPHYLAXIS.   estradiol (ESTRACE) 0.01 % CREA vaginal cream Place vaginally.   famotidine  (PEPCID ) 20 MG tablet Take 20 mg by mouth daily as needed for heartburn or indigestion.   fexofenadine (ALLEGRA) 180 MG tablet Take 180 mg by mouth daily as needed.   ibuprofen (ADVIL) 200 MG tablet Take 200 mg by mouth every 8 (eight) hours as needed.   No facility-administered medications prior to visit.   "

## 2024-04-26 NOTE — Discharge Instructions (Signed)
-   Please follow-up with vascular surgery

## 2024-04-26 NOTE — ED Triage Notes (Signed)
 Pt to ED via POV. Pt presents for nausea and dry heaving that started on Tuesday. Pt has hx of alpha gal and felt like she had a flare up. Pt reports lower abdominal pain with some radiation to LLQ. Pt denies urinary symptoms. Denies being around anyone with recent symptoms.

## 2024-04-26 NOTE — Telephone Encounter (Signed)
"  °  FYI Only or Action Required?: FYI only for provider: appointment scheduled on 04/26/24.  Patient was last seen in primary care on 03/25/2024 by Watt Mirza, MD.  Called Nurse Triage reporting Nausea, Tremors, Anorexia, and Abdominal Pain.  Symptoms began several days ago.  Interventions attempted: OTC medications: Pepto Bismol, Advil, Goodys.  Symptoms are: stable.  Triage Disposition: See Physician Within 24 Hours  Patient/caregiver understands and will follow disposition?: Yes                  Message from [place] sent at 04/26/2024  9:13 AM EST  Reason for Triage: Patient has been feeling bad since Tuesday, is nauseous, real bad shaking spells, very weak and unable to eat.   Reason for Disposition  [1] MODERATE pain (e.g., interferes with normal activities) AND [2] pain comes and goes (cramps) AND [3] present > 24 hours  (Exception: Pain with Vomiting or Diarrhea - see that Guideline.)  Answer Assessment - Initial Assessment Questions 1. LOCATION: Where does it hurt?      Lower abdominal/pubic.  2. RADIATION: Does the pain shoot anywhere else? (e.g., chest, back)     No.  3. ONSET: When did the pain begin? (e.g., minutes, hours or days ago)      Tuesday.  4. SUDDEN: Gradual or sudden onset?     Sudden, lasted several minutes  5. PATTERN Does the pain come and go, or is it constant?     Comes and goes.  6. SEVERITY: How bad is the pain?  (e.g., Scale 1-10; mild, moderate, or severe)     5/10 , not present now.  7. RECURRENT SYMPTOM: Have you ever had this type of stomach pain before? If Yes, ask: When was the last time? and What happened that time?      Hospitalized several years ago with diverticulitis.  8. CAUSE: What do you think is causing the stomach pain? (e.g., gallstones, recent abdominal surgery)     Diverticulitis.  9. RELIEVING/AGGRAVATING FACTORS: What makes it better or worse? (e.g., antacids, bending or  twisting motion, bowel movement)     No.  10. OTHER SYMPTOMS: Do you have any other symptoms? (e.g., back pain, diarrhea, fever, urination pain, vomiting)       Nausea, tremors (Few minutes up to an hour, occurs 3-4 times daily), loss of appetite. Black stool on Pepto Bismol, foul odor to urine. No painful urination, vomiting, diarrhea, fever  Protocols used: Abdominal Pain - Female-A-AH  "

## 2024-05-02 ENCOUNTER — Other Ambulatory Visit

## 2024-05-03 ENCOUNTER — Other Ambulatory Visit (HOSPITAL_BASED_OUTPATIENT_CLINIC_OR_DEPARTMENT_OTHER): Payer: Self-pay

## 2024-05-03 ENCOUNTER — Emergency Department (HOSPITAL_BASED_OUTPATIENT_CLINIC_OR_DEPARTMENT_OTHER)
Admission: EM | Admit: 2024-05-03 | Discharge: 2024-05-03 | Disposition: A | Discharge status: Home / Self Care | Source: Home / Self Care | Attending: Emergency Medicine | Admitting: Emergency Medicine

## 2024-05-03 ENCOUNTER — Other Ambulatory Visit: Payer: Self-pay

## 2024-05-03 ENCOUNTER — Ambulatory Visit: Payer: Self-pay

## 2024-05-03 ENCOUNTER — Emergency Department (HOSPITAL_BASED_OUTPATIENT_CLINIC_OR_DEPARTMENT_OTHER)

## 2024-05-03 DIAGNOSIS — R5383 Other fatigue: Secondary | ICD-10-CM

## 2024-05-03 DIAGNOSIS — R251 Tremor, unspecified: Secondary | ICD-10-CM

## 2024-05-03 DIAGNOSIS — R1013 Epigastric pain: Secondary | ICD-10-CM

## 2024-05-03 DIAGNOSIS — R110 Nausea: Secondary | ICD-10-CM

## 2024-05-03 LAB — COMPREHENSIVE METABOLIC PANEL WITH GFR
ALT: 12 U/L (ref 0–44)
AST: 17 U/L (ref 15–41)
Albumin: 4.6 g/dL (ref 3.5–5.0)
Alkaline Phosphatase: 99 U/L (ref 38–126)
Anion gap: 12 (ref 5–15)
BUN: 11 mg/dL (ref 8–23)
CO2: 25 mmol/L (ref 22–32)
Calcium: 10.5 mg/dL — ABNORMAL HIGH (ref 8.9–10.3)
Chloride: 103 mmol/L (ref 98–111)
Creatinine, Ser: 0.79 mg/dL (ref 0.44–1.00)
GFR, Estimated: >60 mL/min
Glucose, Bld: 115 mg/dL — ABNORMAL HIGH (ref 70–99)
Potassium: 4.1 mmol/L (ref 3.5–5.1)
Sodium: 141 mmol/L (ref 135–145)
Total Bilirubin: 0.4 mg/dL (ref 0.0–1.2)
Total Protein: 7.9 g/dL (ref 6.5–8.1)

## 2024-05-03 LAB — CBC
HCT: 42.2 % (ref 36.0–46.0)
Hemoglobin: 14.1 g/dL (ref 12.0–15.0)
MCH: 28.9 pg (ref 26.0–34.0)
MCHC: 33.4 g/dL (ref 30.0–36.0)
MCV: 86.5 fL (ref 80.0–100.0)
Platelets: 301 10*3/uL (ref 150–400)
RBC: 4.88 MIL/uL (ref 3.87–5.11)
RDW: 13.2 % (ref 11.5–15.5)
WBC: 8.6 10*3/uL (ref 4.0–10.5)
nRBC: 0 % (ref 0.0–0.2)

## 2024-05-03 LAB — LIPASE, BLOOD: Lipase: 31 U/L (ref 11–51)

## 2024-05-03 LAB — URINALYSIS, ROUTINE W REFLEX MICROSCOPIC
Bacteria, UA: NONE SEEN
Bilirubin Urine: NEGATIVE
Glucose, UA: NEGATIVE mg/dL
Hgb urine dipstick: NEGATIVE
Ketones, ur: NEGATIVE mg/dL
Nitrite: NEGATIVE
Protein, ur: NEGATIVE mg/dL
Specific Gravity, Urine: 1.011 (ref 1.005–1.030)
pH: 7 (ref 5.0–8.0)

## 2024-05-03 LAB — RESP PANEL BY RT-PCR (RSV, FLU A&B, COVID)  RVPGX2
Influenza A by PCR: NEGATIVE
Influenza B by PCR: NEGATIVE
Resp Syncytial Virus by PCR: NEGATIVE
SARS Coronavirus 2 by RT PCR: NEGATIVE

## 2024-05-03 LAB — TROPONIN T, HIGH SENSITIVITY: Troponin T High Sensitivity: <6 ng/L (ref 0–19)

## 2024-05-03 MED ORDER — FAMOTIDINE IN NACL 20-0.9 MG/50ML-% IV SOLN
20.0000 mg | Freq: Once | INTRAVENOUS | Status: AC
  Administered 2024-05-03: 20 mg via INTRAVENOUS
  Filled 2024-05-03: qty 50

## 2024-05-03 MED ORDER — FAMOTIDINE 20 MG PO TABS
20.0000 mg | ORAL_TABLET | Freq: Two times a day (BID) | ORAL | 0 refills | Status: AC
  Filled 2024-05-03: qty 30, 15d supply, fill #0

## 2024-05-03 MED ORDER — ONDANSETRON HCL 4 MG/2ML IJ SOLN
4.0000 mg | Freq: Once | INTRAMUSCULAR | Status: AC
  Administered 2024-05-03: 4 mg via INTRAVENOUS
  Filled 2024-05-03: qty 2

## 2024-05-03 MED ORDER — PANTOPRAZOLE SODIUM 40 MG IV SOLR
40.0000 mg | Freq: Once | INTRAVENOUS | Status: AC
  Administered 2024-05-03: 40 mg via INTRAVENOUS
  Filled 2024-05-03: qty 10

## 2024-05-03 MED ORDER — PANTOPRAZOLE SODIUM 20 MG PO TBEC
20.0000 mg | DELAYED_RELEASE_TABLET | Freq: Every day | ORAL | 0 refills | Status: AC
  Filled 2024-05-03: qty 30, 30d supply, fill #0

## 2024-05-03 MED ORDER — ONDANSETRON HCL 4 MG PO TABS: 4.0000 mg | ORAL_TABLET | Freq: Three times a day (TID) | ORAL | Status: AC | PRN

## 2024-05-03 MED ORDER — LACTATED RINGERS IV BOLUS
500.0000 mL | Freq: Once | INTRAVENOUS | Status: AC
  Administered 2024-05-03: 500 mL via INTRAVENOUS

## 2024-05-03 NOTE — Discharge Instructions (Addendum)
 You were seen today for belly pain and nausea with tremors and bodyaches.  Suspect this is likely a viral syndrome.  With your lab work and imaging today reassuring.  Your recent CT scan obviously did show the atherosclerotic plaque with ulcer that will need to be followed up with vascular surgery, please be sure to follow-up with vascular surgery. I sent in medications that were provided today to help with your symptoms.  Continue to stay well-hydrated, eating small frequent meals to help with symptoms.  Otherwise continue to monitor symptoms, if you can having new or worsening symptoms which would include recurrence that has not abated or helps with medications or worsening symptoms including blood in urine or stool, uncontrollable pain, persistent vomiting, shortness of breath, please return to the ED for further evaluation.

## 2024-05-03 NOTE — ED Notes (Signed)
 Lab called to add troponin on to initial blood work.

## 2024-05-03 NOTE — ED Provider Notes (Cosign Needed)
 " [place] EMERGENCY DEPARTMENT AT [facility] Provider Note   CSN: [id number] Arrival date & time: 05/03/24  1207     Patient presents with: Nausea and Vomiting   Melissa Cox is a 74 y.o. female.  HPI  Patient is a 74 year old female presenting ED today presents for nausea, body aches, chills, full body tremors that started yesterday evening and worsening today, noted to have felt similarly when last seen in the emergency department and treated for UTI.  In addition to that she has had 2 dizzy episodes in the last week where she denies vertigo or blurry vision and notes that this is mainly present on exertion or when standing up.  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  Currently endorses fatigue.  Denies headache, blurry vision, vertigo, tinnitus, dysphagia, odynophagia chest pain, shortness of breath, cough, congestion, rhinorrhea, dysuria, hematuria, vaginal discharge, vaginal bleeding, abdominal pain, melena, hematochezia, diarrhea, rashes, lower leg swelling.       Prior to Admission medications  Medication Sig Start Date End Date Taking? Authorizing Provider  famotidine  (PEPCID ) 20 MG tablet Take 1 tablet (20 mg total) by mouth 2 (two) times daily. 05/03/24  Yes Esmirna Ravan S, PA-C  ondansetron  (ZOFRAN ) 4 MG tablet Take 1 tablet (4 mg total) by mouth every 8 (eight) hours as needed for nausea or vomiting. 05/03/24  Yes Elbert Polyakov S, PA-C  pantoprazole  (PROTONIX ) 20 MG tablet Take 1 tablet (20 mg total) by mouth daily. 05/03/24  Yes Beola Terrall RAMAN, PA-C  AMBULATORY NON FORMULARY MEDICATION Synthroid  100 MCG Compounded Medication use daily before breakfast 03/25/24   Copland, Jacques, MD  cephALEXin  (KEFLEX ) 500 MG capsule Take 1 capsule (500 mg total) by mouth 3 (three) times daily. 04/26/24   Goodman, Graydon, MD  cetirizine HCl (ZYRTEC CHILDRENS ALLERGY) 5 MG/5ML  SOLN Take 5 mg by mouth daily as needed for allergies.    [provider]  Cholecalciferol (D3 5000) 125 MCG (5000 UT) capsule Take 5,000 Units by mouth daily.    [provider]  clobetasol  cream (TEMOVATE ) 0.05 % APPLY TO AFFECTED AREA TWICE A DAY 03/17/23   Copland, Jacques, MD  EPINEPHrine  0.3 mg/0.3 mL IJ SOAJ injection INJECT 0.3 MG INTO THE MUSCLE AS NEEDED FOR ANAPHYLAXIS. 07/04/23   Copland, Jacques, MD  estradiol (ESTRACE) 0.01 % CREA vaginal cream Place vaginally.    [provider]  famotidine  (PEPCID ) 20 MG tablet Take 20 mg by mouth daily as needed for heartburn or indigestion.    [provider]  fexofenadine (ALLEGRA) 180 MG tablet Take 180 mg by mouth daily as needed.    [provider]  ibuprofen (ADVIL) 200 MG tablet Take 200 mg by mouth every 8 (eight) hours as needed.    [provider]    Allergies: Demerol [meperidine], Meperidine hcl, Diphenhydramine hcl, Doxycycline , Prochlorperazine edisylate, and Alpha-gal    Review of Systems  Constitutional:  Positive for chills and fatigue.  Gastrointestinal:  Positive for nausea.  All other systems reviewed and are negative.   Updated Vital Signs BP (!) 171/76   Pulse 69   Temp 98.9 F (37.2 C) (Oral)   Resp 18   Ht 5' 4 (1.626 m)   Wt 77.1 kg   SpO2 96%   BMI 29.18 kg/m   Physical Exam Vitals and nursing note reviewed.  Constitutional:      General: She is not in acute distress.  Appearance: Normal appearance. She is not ill-appearing or diaphoretic.  HENT:     Head: Normocephalic and atraumatic.     Mouth/Throat:     Mouth: Mucous membranes are moist.     Pharynx: Oropharynx is clear. No oropharyngeal exudate or posterior oropharyngeal erythema.  Eyes:     General: No scleral icterus.       Right eye: No discharge.        Left eye: No discharge.     Extraocular Movements: Extraocular movements intact.     Conjunctiva/sclera: Conjunctivae normal.      Pupils: Pupils are equal, round, and reactive to light.  Cardiovascular:     Rate and Rhythm: Normal rate and regular rhythm.     Pulses: Normal pulses.     Heart sounds: Normal heart sounds. No murmur heard.    No friction rub. No gallop.  Pulmonary:     Effort: Pulmonary effort is normal. No respiratory distress.     Breath sounds: No stridor. No wheezing, rhonchi or rales.  Chest:     Chest wall: No tenderness.  Abdominal:     General: Abdomen is flat. There is distension (Mild abdomen tender to epigastric and right upper quadrant.).     Palpations: Abdomen is soft.     Tenderness: There is no abdominal tenderness. There is no right CVA tenderness, left CVA tenderness, guarding or rebound.     Comments: No lower abdominal tenderness to palpation.  No signs of obvious bruising or ecchymosis.  Musculoskeletal:        General: No swelling, deformity or signs of injury.     Cervical back: Normal range of motion. No rigidity.     Right lower leg: No edema.     Left lower leg: No edema.  Skin:    General: Skin is warm and dry.     Findings: No bruising, erythema or lesion.  Neurological:     General: No focal deficit present.     Mental Status: She is alert and oriented to person, place, and time. Mental status is at baseline.     Sensory: No sensory deficit.     Motor: No weakness.  Psychiatric:        Mood and Affect: Mood normal.     (all labs ordered are listed, but only abnormal results are displayed) Labs Reviewed  COMPREHENSIVE METABOLIC PANEL WITH GFR - Abnormal; Notable for the following components:      Result Value   Glucose, Bld 115 (*)    Calcium 10.5 (*)    All other components within normal limits  URINALYSIS, ROUTINE W REFLEX MICROSCOPIC - Abnormal; Notable for the following components:   Color, Urine COLORLESS (*)    Leukocytes,Ua SMALL (*)    All other components within normal limits  RESP PANEL BY RT-PCR (RSV, FLU A&B, COVID)  RVPGX2  LIPASE, BLOOD  CBC   TROPONIN T, HIGH SENSITIVITY    EKG: EKG Interpretation Date/Time:  Friday May 03 2024 13:46:06 EST Ventricular Rate:  69 PR Interval:  207 QRS Duration:  93 QT Interval:  374 QTC Calculation: 401 R Axis:   5  Text Interpretation: Sinus rhythm Low voltage, precordial leads since last tracing no significant change Confirmed by Lenor Hollering [phone]) on 05/03/2024 2:41:35 PM  Radiology: US  Abdomen Limited RUQ (LIVER/GB) Result Date: 05/03/2024 CLINICAL DATA:  Right upper quadrant abdominal pain for 1 day EXAM: ULTRASOUND ABDOMEN LIMITED RIGHT UPPER QUADRANT COMPARISON:  April 26, 2024 FINDINGS: Gallbladder: No gallstones  or wall thickening visualized. No sonographic Murphy sign noted by sonographer. Common bile duct: Diameter: 4 mm which is within normal limits. Liver: 3.3 cm right hepatic cyst is noted. Hepatic parenchyma is grossly normal in echogenicity. Portal vein is patent on color Doppler imaging with normal direction of blood flow towards the liver. Other: None. IMPRESSION: No significant abnormality seen in the right upper quadrant of the abdomen. Electronically Signed   By: Lynwood Landy Raddle M.D.   On: 05/03/2024 16:14     Procedures   Medications Ordered in the ED  lactated ringers  bolus 500 mL (0 mLs Intravenous Stopped 05/03/24 1400)  pantoprazole  (PROTONIX ) injection 40 mg (40 mg Intravenous Given 05/03/24 1348)  famotidine  (PEPCID ) IVPB 20 mg premix (0 mg Intravenous Stopped 05/03/24 1427)  ondansetron  (ZOFRAN ) injection 4 mg (4 mg Intravenous Given 05/03/24 1354)      Medical Decision Making Amount and/or Complexity of Data Reviewed Labs: ordered. Radiology: ordered. ECG/medicine tests: ordered.  Risk Prescription drug management.  This patient is a 74 year old female who presents to the ED for concern of epigastric abdominal discomfort accompanied with nausea and general body aches and generalized tremors that started last night.  Notably was seen early emergency  department with CT scan showing atherosclerotic plaque to aorta with ulcer with no extravasation.  On physical exam, patient is in no acute distress, afebrile, alert and orient x 4, speaking in full sentences, nontachypneic, nontachycardic.  LCTAB, RRR, no murmur, no lower leg edema.  Notable does have some mild epigastric abdominal pain and right upper quadrant abdominal pain to palpation.  No CVA tenderness.  Overall well-appearing.  With patient symptoms, and recent CT scans, discussed with patient to get right upper quadrant ultrasound and defer on recent CT with patient symptoms noting to feel not worse than previous.  Low suspicion for extravasation of atherosclerotic plaque.  Suspecting likely gastritis, GERD or viral syndrome.  Provided GERD medications and on reevaluation noted to have had significant improvement of symptoms.  Lab work was unremarkable, with viral quad ultrasound showing no acute findings.  Will have her continue to symptomatically manage at home, following up with PCP, returning for any new or worsening symptoms.  Had shared decision-making with patient who agreed to defer on CT scan at this time but will monitor symptoms and return for new or worsening symptoms.  Case was discussed with attending who agreed with plan.  Patient vital signs have remained stable throughout the course of patient's time in the ED. Low suspicion for any other emergent pathology at this time. I believe this patient is safe to be discharged. Provided strict return to ER precautions. Patient expressed agreement and understanding of plan. All questions were answered.  Differential diagnoses prior to evaluation: The emergent differential diagnosis includes, but is not limited to, cholecystitis, hepatitis, gastritis, enteritis, pancreatitis, ACS, AAS, URI. This is not an exhaustive differential.   Past Medical History / Co-morbidities / Social History: Hypothyroidism, alpha gal, diverticulitis,  GERD, HLD  Additional history: Chart reviewed. Pertinent results include:   Patient last seen in the emergency department for lower abdominal pain, UTI 04/26/2024.  Noted to have a CT scan which showed aortic atherosclerotic plaque with ulcer, having discussed with Dr. Tisa with vascular surgery and was to follow-up with vascular surgery in clinic.  Provided with a gram of Rocephin  and sent home on Keflex .  Lab Tests/Imaging studies: I personally interpreted labs/imaging and the pertinent results include:    CBC unremarkable CMP notes a mild  hypercalcemia 10.5 but otherwise unremarkable Lipase unremarkable UA notes small leukocytes and 6-10 WBCs with no bacteria noted. Troponin undetectable Respiratory panel negative Right upper quadrant ultrasound does not show any acute abnormalities   I agree with the radiologist interpretation.  Cardiac monitoring: EKG obtained and interpreted by myself and attending physician which shows:   EKG Interpretation Date/Time:  Friday May 03 2024 13:46:06 EST Ventricular Rate:  69 PR Interval:  207 QRS Duration:  93 QT Interval:  374 QTC Calculation: 401 R Axis:   5  Text Interpretation: Sinus rhythm Low voltage, precordial leads since last tracing no significant change Confirmed by Lenor Hollering [phone]) on 05/03/2024 2:41:35 PM          Medications: I ordered medication including Protonix , Pepcid , LR.  I have reviewed the patients home medicines and have made adjustments as needed.  Critical Interventions: None  Social Determinants of Health: Has good follow-up with PCP and vascular surgery  Disposition: After consideration of the diagnostic results and the patients response to treatment, I feel that the patient would benefit from discharge and treatment as above.   emergency department workup does not suggest an emergent condition requiring admission or immediate intervention beyond what has been performed at this time. The plan is:  Observe at home, if worsening symptoms or recurrence of symptoms, return to the ED for further evaluation, follow-up with vascular surgery. The patient is safe for discharge and has been instructed to return immediately for worsening symptoms, change in symptoms or any other concerns.   Final diagnoses:  Epigastric pain  Occasional tremors  Nausea  Fatigue, unspecified type    ED Discharge Orders          Ordered    famotidine  (PEPCID ) 20 MG tablet  2 times daily        05/03/24 1740    ondansetron  (ZOFRAN ) 4 MG tablet  Every 8 hours PRN        05/03/24 1740    pantoprazole  (PROTONIX ) 20 MG tablet  Daily        05/03/24 1740               Beola Terrall RAMAN, PA-C 05/03/24 1925  "

## 2024-05-03 NOTE — Telephone Encounter (Signed)
 FYI Only or Action Required?: FYI only for provider: ED advised.  Patient was last seen in primary care on 04/26/2024 by Gasper Nancyann BRAVO, MD.  Called Nurse Triage reporting Fatigue, Nausea, and Abdominal Pain.  Symptoms began last night.  Interventions attempted: Rest, hydration, or home remedies.  Symptoms are: gradually worsening.  Triage Disposition: Go to ED Now (or PCP Triage)  Patient/caregiver understands and will follow disposition?: Yes   Message from [facility] sent at 05/03/2024 11:00 AM EST  Summary: Light headed could pass out Nausea   Reason for Triage: In ED 04/26/24 UTI Very shaky, uncountable sometimes Light headed could pass out Nausea         Reason for Disposition  Patient sounds very sick or weak to the triager  Additional Information  Negative: [1] Vomiting 2 or more times AND [2] interferes with taking oral antibiotic    No vomiting, nausea only  Answer Assessment - Initial Assessment Questions 1. MAIN SYMPTOM: What is the main symptom you are concerned about? (e.g., painful urination, urine frequency)     Nausea, abd pain, weakness 2. BETTER-SAME-WORSE: Are you getting better, staying the same, or getting worse compared to how you felt at your last visit to the doctor (most recent medical visit)?     *No Answer* 3. PAIN: How bad is the pain?  (e.g., Scale 1-10; mild, moderate, or severe)     *No Answer* 4. FEVER: Do you have a fever? If Yes, ask: What is it, how was it measured, and when did it start?     *No Answer* 5. OTHER SYMPTOMS: Do you have any other symptoms? (e.g., blood in the urine, flank pain, vaginal discharge)     *No Answer* 6. DIAGNOSIS: When was the UTI diagnosed? By whom? Was it a kidney infection, bladder infection or both?     ED 7. ANTIBIOTIC: What antibiotic(s) are you taking? How many times per day?     Keflex  8. ANTIBIOTIC - START DATE: When did you start taking the antibiotic?     *No  Answer*  Protocols used: Urinary Tract Infection on Antibiotic Follow-up Call - [facility]

## 2024-05-03 NOTE — Telephone Encounter (Signed)
 Patient is currently at [facility].

## 2024-05-03 NOTE — ED Triage Notes (Signed)
 Arrives POV with complaints of developing nausea and vomiting overnight. Patient had similar symptoms last week and she was started on antibiotics for a UTI

## 2024-05-06 ENCOUNTER — Ambulatory Visit: Admitting: Endocrinology

## 2024-05-10 ENCOUNTER — Other Ambulatory Visit

## 2024-05-14 ENCOUNTER — Ambulatory Visit: Admitting: Endocrinology

## 2025-02-26 ENCOUNTER — Other Ambulatory Visit

## 2025-02-28 ENCOUNTER — Ambulatory Visit

## 2025-03-05 ENCOUNTER — Encounter: Admitting: Family Medicine

## 2025-03-05 ENCOUNTER — Ambulatory Visit
